# Patient Record
Sex: Female | Born: 1980
Health system: Southern US, Community
[De-identification: ages and names within clinical notes are randomized; demographics above are authoritative.]

## PROBLEM LIST (undated history)

## (undated) DIAGNOSIS — T7840XA Allergy, unspecified, initial encounter: Secondary | ICD-10-CM

## (undated) DIAGNOSIS — K219 Gastro-esophageal reflux disease without esophagitis: Secondary | ICD-10-CM

## (undated) DIAGNOSIS — F419 Anxiety disorder, unspecified: Secondary | ICD-10-CM

## (undated) DIAGNOSIS — I10 Essential (primary) hypertension: Secondary | ICD-10-CM

## (undated) HISTORY — PX: COMBINED ABDOMINOPLASTY AND LIPOSUCTION: SUR284

## (undated) HISTORY — DX: Allergy, unspecified, initial encounter: T78.40XA

## (undated) HISTORY — PX: BREAST SURGERY: SHX581

## (undated) HISTORY — DX: Essential (primary) hypertension: I10

## (undated) HISTORY — PX: REDUCTION MAMMAPLASTY: SUR839

## (undated) HISTORY — DX: Anxiety disorder, unspecified: F41.9

## (undated) HISTORY — PX: OTHER SURGICAL HISTORY: SHX169

## (undated) HISTORY — PX: COSMETIC SURGERY: SHX468

## (undated) HISTORY — DX: Gastro-esophageal reflux disease without esophagitis: K21.9

---

## 1999-09-06 ENCOUNTER — Inpatient Hospital Stay (HOSPITAL_COMMUNITY): Admission: AD | Admit: 1999-09-06 | Discharge: 1999-09-10 | Payer: Self-pay | Admitting: Obstetrics

## 1999-09-06 ENCOUNTER — Encounter (INDEPENDENT_AMBULATORY_CARE_PROVIDER_SITE_OTHER): Payer: Self-pay | Admitting: *Deleted

## 1999-09-07 ENCOUNTER — Encounter: Payer: Self-pay | Admitting: Obstetrics

## 2002-01-16 ENCOUNTER — Encounter: Payer: Self-pay | Admitting: Emergency Medicine

## 2002-01-16 ENCOUNTER — Emergency Department (HOSPITAL_COMMUNITY): Admission: EM | Admit: 2002-01-16 | Discharge: 2002-01-16 | Payer: Self-pay | Admitting: Emergency Medicine

## 2003-06-21 ENCOUNTER — Encounter: Admission: RE | Admit: 2003-06-21 | Discharge: 2003-06-21 | Payer: Self-pay | Admitting: Internal Medicine

## 2003-07-23 ENCOUNTER — Encounter: Admission: RE | Admit: 2003-07-23 | Discharge: 2003-07-23 | Payer: Self-pay | Admitting: Internal Medicine

## 2004-03-21 ENCOUNTER — Other Ambulatory Visit: Admission: RE | Admit: 2004-03-21 | Discharge: 2004-03-21 | Payer: Self-pay | Admitting: Obstetrics and Gynecology

## 2004-10-12 ENCOUNTER — Inpatient Hospital Stay (HOSPITAL_COMMUNITY): Admission: AD | Admit: 2004-10-12 | Discharge: 2004-10-14 | Payer: Self-pay | Admitting: Obstetrics and Gynecology

## 2005-01-01 ENCOUNTER — Emergency Department (HOSPITAL_COMMUNITY): Admission: EM | Admit: 2005-01-01 | Discharge: 2005-01-01 | Payer: Self-pay | Admitting: Family Medicine

## 2005-03-11 ENCOUNTER — Emergency Department (HOSPITAL_COMMUNITY): Admission: EM | Admit: 2005-03-11 | Discharge: 2005-03-11 | Payer: Self-pay | Admitting: Emergency Medicine

## 2005-05-30 ENCOUNTER — Emergency Department (HOSPITAL_COMMUNITY): Admission: EM | Admit: 2005-05-30 | Discharge: 2005-05-30 | Payer: Self-pay | Admitting: Family Medicine

## 2005-06-20 ENCOUNTER — Emergency Department (HOSPITAL_COMMUNITY): Admission: EM | Admit: 2005-06-20 | Discharge: 2005-06-20 | Payer: Self-pay | Admitting: Emergency Medicine

## 2006-09-20 ENCOUNTER — Ambulatory Visit (HOSPITAL_BASED_OUTPATIENT_CLINIC_OR_DEPARTMENT_OTHER): Admission: RE | Admit: 2006-09-20 | Discharge: 2006-09-21 | Payer: Self-pay | Admitting: Plastic Surgery

## 2006-09-20 ENCOUNTER — Encounter (INDEPENDENT_AMBULATORY_CARE_PROVIDER_SITE_OTHER): Payer: Self-pay | Admitting: Specialist

## 2006-10-26 ENCOUNTER — Emergency Department (HOSPITAL_COMMUNITY): Admission: EM | Admit: 2006-10-26 | Discharge: 2006-10-26 | Payer: Self-pay | Admitting: Family Medicine

## 2008-03-02 ENCOUNTER — Emergency Department (HOSPITAL_COMMUNITY): Admission: EM | Admit: 2008-03-02 | Discharge: 2008-03-02 | Payer: Self-pay | Admitting: Emergency Medicine

## 2008-06-28 ENCOUNTER — Emergency Department (HOSPITAL_COMMUNITY): Admission: EM | Admit: 2008-06-28 | Discharge: 2008-06-28 | Payer: Self-pay | Admitting: Emergency Medicine

## 2008-09-09 ENCOUNTER — Emergency Department (HOSPITAL_COMMUNITY): Admission: EM | Admit: 2008-09-09 | Discharge: 2008-09-09 | Payer: Self-pay | Admitting: Emergency Medicine

## 2008-12-25 ENCOUNTER — Emergency Department (HOSPITAL_COMMUNITY): Admission: EM | Admit: 2008-12-25 | Discharge: 2008-12-25 | Payer: Self-pay | Admitting: Emergency Medicine

## 2011-04-20 NOTE — H&P (Signed)
NAME:  Ashley Summers, GAL NO.:  [id number]   MEDICAL RECORD NO.:  [id number]          PATIENT TYPE:  INP   LOCATION:  9168                          FACILITY:  [facility]   PHYSICIAN:  Juluis Mire, M.D.   DATE OF BIRTH:  06-17-1981   DATE OF ADMISSION:  10/12/2004  DATE OF DISCHARGE:                                HISTORY & PHYSICAL   BRIEF HISTORY:  Patient is a 30 year old gravida 5 para 1-1-2-2 black female  who presents with spontaneous rupture of membranes.  Last menstrual period  was January 11, 2004, this gives her an estimated date of confinement  October 17, 2004 and an estimated gestational age of [redacted] weeks 4 days.  This  is consistent with initial exam and prior ultrasound done at 17 weeks.   Her prenatal course has been complicated by prior low transverse cesarean  section done with the last pregnancy for breech presentation.  Prior  pregnancy to that had a vaginal delivery.  She was desirous of a trial of  labor.  We have discussed with her the risk of uterine scar rupture of 1-2%.  This can be associated with maternal morbidity and mortality.  We also have  discussed the potential fetal morbidity and mortality.  She does wish to  proceed with trial of labor.   She also had a positive group B strep and will be treated with antibiotics  in the form of penicillin G.  Finally she was also tested positive for  Chlamydia with the pregnancy, was treated and did have a negative test of  cure.   ALLERGIES:  No known drug allergies.   MEDICATIONS INCLUDE:  Prenatal vitamins.   PRENATAL LABORATORIES:  Patient is B positive, negative antibody screen,  positive rubella, negative hepatitis B surface antigen.   PAST MEDICAL HISTORY, FAMILY HISTORY, SOCIAL HISTORY:  Please see prenatal  records.   REVIEW OF SYSTEMS:  Noncontributory.   PHYSICAL EXAMINATION:  Patient is afebrile with stable vital signs.  Lungs  are clear.  Cardiovascular system regular rhythm/rate  with a grade 2/6  systolic ejection murmur.  No clicks or gallops.  Abdominal exam with gravid  uterus consistent with dates, there is well-healed low transverse incision.  Fetal heart tones are audible.  Estimated fetal weight of approximately 6  pounds.  Pelvic exam gross rupture of membranes clear.  Pelvic exam cervix  is 2-3 cm, 50% effaced, vertex presenting, amniotic fluid was clear,  adequate pelvimetry noted.  Extremities trace edema.  Neurologic exam is  grossly within normal limits.  Deep tendon reflexes are 2+ with no clonus.   IMPRESSION:  1.  Intrauterine pregnancy at 36-plus weeks with gross rupture of membranes.  2.  Prior cesarean section desirous of trial of labor.  3.  Positive group B beta streptococcus.   PLAN:  Patient to undergo trial of labor.  Again the risk of uterine scar  rupture with associated maternal and fetal morbidity and mortality have been  explained.  This can include the need for transfusion for the mother or  possible hysterectomy.  Potential for fetal death  or damage.  Patient still  wishes to proceed with trial of labor.  Will be treated with IV penicillin G  for group B strep.  Labor will be actively managed.      JSM/MEDQ  D:  10/12/2004  T:  10/12/2004  Job:  [id number]

## 2011-04-20 NOTE — Op Note (Signed)
NAME:  Ashley Summers, Ashley Summers               ACCOUNT NO.:  [id number]   MEDICAL RECORD NO.:  [id number]          PATIENT TYPE:  AMB   LOCATION:  DSC                          FACILITY:  [facility]   PHYSICIAN:  Etter Sjogren, M.D.     DATE OF BIRTH:  December 17, 1980   DATE OF PROCEDURE:  09/20/2006  DATE OF DISCHARGE:                                 OPERATIVE REPORT   PREOPERATIVE DIAGNOSIS:  Bilateral macromastia with upper back, neck and  shoulder pain.   POSTOPERATIVE DIAGNOSIS:  Bilateral macromastia with upper back, neck and  shoulder pain.   PROCEDURE:  Bilateral breast reduction.   SURGEON:  Etter Sjogren, M.D.   ASSISTANT:  Milda Smart Cox, RNFA   ANESTHESIA:  General.   ESTIMATED BLOOD LOSS:  150 mL.   CLINICAL NOTE:  This 30 year old woman with large breasts complains of upper  back, neck, shoulder pain, and bra strap shoulder grooving.  She desires  breast reduction.  The procedure and risks and possible complications  discussed with her in detail and she understood those and wished to proceed.   PROCEDURE:  The patient marked in a full standing position in the holding  area for the breast reduction.  She was taken to the operating room and  placed supine.  After satisfactory administration of general anesthesia,  prepped with Betadine and draped with sterile drapes.  A 42 mm marker used  to mark the new nipple areolar complex and 8 cm inferior nipple pedicles  were designed.  All incisions made, inferior nipple pedicles were de-  epithelized and inferior pedicles and isolated surrounding tissue using  electrocautery building in an outward direction medially and laterally in  order to ensure broader attachment at the level of the chest wall than at  the level of the skin.  At conclusion, there was bright red bleeding around  the periphery of the nipple consistent with viability.  Resections were  performed medial, central and lateral.  A total of 780 g from the slightly  larger left side  and 752 g from slightly smaller right side.  After  irrigation, hemostasis was achieved with electrocautery.  Excellent  hemostasis having been confirmed, the closure was with 2-0 Vicryl in  interrupted inverted deep suture in the inverted T position, 3-0 Monocryl  interrupted inverted deep dermal sutures, 4-0 Monocryl running subcuticular  sutures.  Measurement was taken 5 cm up from the inframammary crease, 42 mm  marker used to mark the nipple areolar site.  Tissue resected, irrigated  thoroughly and the nipple areolar complex was found to be viable with good  color and bright red bleeding around the periphery again consistent with  viability.  Nipples were then inset using 3-0 Monocryl interrupted for deep  sutures, 4-0 Monocryl running subcuticular sutures.  Steri-Strips, dry  sterile dressing, circumferential ace wrap were applied.  She was  transferred to the recovery room stable.  Tolerated the procedure well.   DISPOSITION:  She will be observed in the ICU overnight.      Etter Sjogren, M.D.  Electronically Signed     DB/MEDQ  D:  09/20/2006  T:  09/21/2006  Job:  [id number]

## 2011-09-03 LAB — POCT RAPID STREP A: Streptococcus, Group A Screen (Direct): NEGATIVE

## 2011-09-03 LAB — POCT INFECTIOUS MONO SCREEN: Mono Screen: NEGATIVE

## 2013-04-09 ENCOUNTER — Ambulatory Visit: Payer: Self-pay | Admitting: Obstetrics

## 2014-06-24 ENCOUNTER — Encounter: Payer: Self-pay | Admitting: Obstetrics & Gynecology

## 2014-06-24 ENCOUNTER — Ambulatory Visit (INDEPENDENT_AMBULATORY_CARE_PROVIDER_SITE_OTHER): Payer: 59 | Admitting: Obstetrics & Gynecology

## 2014-06-24 VITALS — BP 131/67 | HR 59 | Temp 97.9°F | Wt 194.4 lb

## 2014-06-24 DIAGNOSIS — N72 Inflammatory disease of cervix uteri: Secondary | ICD-10-CM

## 2014-06-24 DIAGNOSIS — N926 Irregular menstruation, unspecified: Secondary | ICD-10-CM

## 2014-06-24 DIAGNOSIS — N939 Abnormal uterine and vaginal bleeding, unspecified: Secondary | ICD-10-CM

## 2014-06-24 DIAGNOSIS — Z01419 Encounter for gynecological examination (general) (routine) without abnormal findings: Secondary | ICD-10-CM

## 2014-06-24 DIAGNOSIS — Z975 Presence of (intrauterine) contraceptive device: Secondary | ICD-10-CM

## 2014-06-24 LAB — POCT URINE PREGNANCY: PREG TEST UR: NEGATIVE

## 2014-06-24 MED ORDER — ESTROGENS CONJUGATED 0.625 MG PO TABS: 0.6250 mg | ORAL_TABLET | Freq: Every day | ORAL | Status: DC

## 2014-06-24 MED ORDER — DOXYCYCLINE HYCLATE 100 MG PO TABS: 100.0000 mg | ORAL_TABLET | Freq: Two times a day (BID) | ORAL | Status: DC

## 2014-06-24 NOTE — Addendum Note (Signed)
Addended by: Ladona Ridgel on: 06/24/2014 09:59 AM   Modules accepted: Orders

## 2014-06-24 NOTE — Progress Notes (Signed)
Subjective:     Ashley Summers is a 33 y.o. female here for a routine exam.  Current complaints: abnormal bleeding.    Personal health questionnaire:  Is patient Ashley Summers, have a family history of breast and/or ovarian cancer: no Is there a family history of uterine cancer diagnosed at age < 12, gastrointestinal cancer, urinary tract cancer, family member who is a Field seismologist syndrome-associated carrier: no Is the patient overweight and hypertensive, family history of diabetes, personal history of gestational diabetes or PCOS: yes Is patient over 15, have PCOS,  family history of premature CHD under age 78, diabetes, smoke, have hypertension or peripheral artery disease:  no   Gynecologic History Patient's last menstrual period was 05/29/2014. Contraception: IUD Last Pap: 2013. Results were: normal   Obstetric History OB History  Gravida Para Term Preterm AB SAB TAB Ectopic Multiple Living  3 3 1 2      3     # Outcome Date GA Lbr Len/2nd Weight Sex Delivery Anes PTL Lv  3 TRM 10/13/04 [redacted]w[redacted]d   F VBAC None  Y  2 PRE 09/07/99 [redacted]w[redacted]d   M LTCS EPI  Y  1 PRE 03/21/97 [redacted]w[redacted]d   M SVD None  Y      History reviewed. No pertinent past medical history.  Past Surgical History  Procedure Laterality Date  . Breast surgery      Reduction     No current outpatient prescriptions on file. No Known Allergies  History  Substance Use Topics  . Smoking status: Never Smoker   . Smokeless tobacco: Never Used  . Alcohol Use: Yes    Family History  Problem Relation Age of Onset  . Hypertension Mother   . Stroke Mother       Review of Systems  Constitutional: negative for fatigue and weight loss Respiratory: negative for cough and wheezing Cardiovascular: negative for chest pain, fatigue and palpitations Gastrointestinal: negative for abdominal pain and change in bowel habits Musculoskeletal:negative for myalgias Neurological: negative for gait problems and tremors Behavioral/Psych:  negative for abusive relationship, depression Endocrine: negative for temperature intolerance   Genitourinary: positive for abnormal menstrual periods; negative for genital lesions, hot flashes, sexual problems and vaginal discharge Integument/breast: negative for breast lump, breast tenderness, nipple discharge and skin lesion(s)    Objective:       BP 131/67  Pulse 59  Temp(Src) 97.9 F (36.6 C)  Wt 88.179 kg (194 lb 6.4 oz)  LMP 05/29/2014 General:   alert  Skin:   no rash or abnormalities  Lungs:   clear to auscultation bilaterally  Heart:   regular rate and rhythm, S1, S2 normal, no murmur, click, rub or gallop  Breasts:   normal without suspicious masses, skin or nipple changes or axillary nodes  Abdomen:  normal findings: no organomegaly, soft, non-tender and no hernia  Pelvis:  External genitalia: normal general appearance Urinary system: urethral meatus normal and bladder without fullness, nontender Vaginal: normal without tenderness, induration or masses Cervix: friable; IUD strings seen Adnexa: normal bimanual exam Uterus: anteverted and non-tender, normal size  Limited pelvic USG wnl Lab Review Urine pregnancy test Labs reviewed no Radiologic studies reviewed no     Assessment:    Healthy female exam.   ?cervicitis Unscheduled bleed with mirena IUD Plan:    Education materials provided re: sterilization Doxycyline/Premarin Return prn or in 1 mth

## 2014-06-24 NOTE — Patient Instructions (Signed)
Sterilization Information, Female Female sterilization is a procedure to permanently prevent pregnancy. There are different ways to perform sterilization, but all either block or close the fallopian tubes so that your eggs cannot reach your uterus. If your egg cannot reach your uterus, sperm cannot fertilize the egg, and you cannot get pregnant.  Sterilization is performed by a surgical procedure. Sometimes these procedures are performed in a hospital while a patient is asleep. Sometimes they can be done in a clinic setting with the patient awake. The fallopian tubes can be surgically cut, tied, or sealed through a procedure called tubal ligation. The fallopian tubes can also be closed with clips or rings. Sterilization can also be done by placing a tiny coil into each fallopian tube, which causes scar tissue to grow inside the tube. The scar tissue then blocks the tubes.  Discuss sterilization with your caregiver to answer any concerns you or your partner may have. You may want to ask what type of sterilization your caregiver performs. Some caregivers may not perform all the various options. Sterilization is permanent and should only be done if you are sure you do not want children or do not want any more children. Having a sterilization reversed may not be successful.  STERILIZATION PROCEDURES  Laparoscopic sterilization. This is a surgical method performed at a time other than right after childbirth. Two incisions are made in the lower abdomen. A thin, lighted tube (laparoscope) is inserted into one of the incisions and is used to perform the procedure. The fallopian tubes are closed with a ring or a clip. An instrument that uses heat could be used to seal the tubes closed (electrocautery).   Mini-laparotomy. This is a surgical method done 1 or 2 days after giving birth. Typically, a small incision is made just below the belly button (umbilicus) and the fallopian tubes are exposed. The tubes can then be  sealed, tied, or cut.   Hysteroscopic sterilization. This is performed at a time other than right after childbirth. A tiny, spring-like coil is inserted through the cervix and uterus and placed into the fallopian tubes. The coil causes scaring and blocks the tubes. Other forms of contraception should be used for 3 months after the procedure to allow the scar tissue to form completely. Additionally, it is required hysterosalpingography be done 3 months later to ensure that the procedure was successful. Hysterosalpingography is a procedure that uses X-rays to look at your uterus and fallopian tubes after a material to make them show up better has been inserted. IS STERILIZATION SAFE? Sterilization is considered safe with very rare complications. Risks depend on the type of procedure you have. As with any surgical procedure, there are risks. Some risks of sterilization by any means include:   Bleeding.  Infection.  Reaction to anesthesia medicine.  Injury to surrounding organs. Risks specific to having hysteroscopic coils placed include:  The coils may not be placed correctly the first time.   The coils may move out of place.   The tubes may not get completely blocked after 3 months.   Injury to surrounding organs when placing the coil.  HOW EFFECTIVE IS FEMALE STERILIZATION? Sterilization is nearly 100% effective, but it can fail. Depending on the type of sterilization, the rate of failure can be as high as 3%. After hysteroscopic sterilization with placement of fallopian tube coils, you will need back-up birth control for 3 months after the procedure. Sterilization is effective for a lifetime.  BENEFITS OF STERILIZATION  It does   not affect your hormones, and therefore will not affect your menstrual periods, sexual desire, or performance.   It is effective for a lifetime.   It is safe.   You do not need to worry about getting pregnant. Keep in mind that if you had the  hysteroscopic placement procedure, you must wait 3 months after the procedure (or until your caregiver confirms) before pregnancy is not considered possible.   There are no side effects unlike other types of birth control (contraception).  DRAWBACKS OF STERILIZATION  You must be sure you do not want children or any more children. The procedure is permanent.   It does not provide protection against sexually transmitted infections (STIs).   The tubes can grow back together. If this happens, there is a risk of pregnancy. There is also an increased risk (50%) of pregnancy being an ectopic pregnancy. This is a pregnancy that happens outside of the uterus. Document Released: 05/07/2008 Document Revised: 11/24/2013 Document Reviewed: 03/06/2012 [facility] Patient Information 2015 [place], [place]. This information is not intended to replace advice given to you by your health care provider. Make sure you discuss any questions you have with your health care provider. Health Maintenance Adopting a healthy lifestyle and getting preventive care can go a long way to promote health and wellness. Talk with your health care provider about what schedule of regular examinations is right for you. This is a good chance for you to check in with your provider about disease prevention and staying healthy. In between checkups, there are plenty of things you can do on your own. Experts have done a lot of research about which lifestyle changes and preventive measures are most likely to keep you healthy. Ask your health care provider for more information. WEIGHT AND DIET  Eat a healthy diet  Be sure to include plenty of vegetables, fruits, low-fat dairy products, and lean protein.  Do not eat a lot of foods high in solid fats, added sugars, or salt.  Get regular exercise. This is one of the most important things you can do for your health.  Most adults should exercise for at least 150 minutes each week. The exercise  should increase your heart rate and make you sweat (moderate-intensity exercise).  Most adults should also do strengthening exercises at least twice a week. This is in addition to the moderate-intensity exercise.  Maintain a healthy weight  Body mass index (BMI) is a measurement that can be used to identify possible weight problems. It estimates body fat based on height and weight. Your health care provider can help determine your BMI and help you achieve or maintain a healthy weight.  For females 36 years of age and older:   A BMI below 18.5 is considered underweight.  A BMI of 18.5 to 24.9 is normal.  A BMI of 25 to 29.9 is considered overweight.  A BMI of 30 and above is considered obese.  Watch levels of cholesterol and blood lipids  You should start having your blood tested for lipids and cholesterol at 35 years of age, then have this test every 5 years.  You may need to have your cholesterol levels checked more often if:  Your lipid or cholesterol levels are high.  You are older than 35 years of age.  You are at high risk for heart disease.  CANCER SCREENING   Lung Cancer  Lung cancer screening is recommended for adults 26-2 years old who are at high risk for lung cancer because of  a history of smoking.  A yearly low-dose CT scan of the lungs is recommended for people who:  Currently smoke.  Have quit within the past 15 years.  Have at least a 30-pack-year history of smoking. A pack year is smoking an average of one pack of cigarettes a day for 1 year.  Yearly screening should continue until it has been 15 years since you quit.  Yearly screening should stop if you develop a health problem that would prevent you from having lung cancer treatment.  Breast Cancer  Practice breast self-awareness. This means understanding how your breasts normally appear and feel.  It also means doing regular breast self-exams. Let your health care provider know about any  changes, no matter how small.  If you are in your 20s or 30s, you should have a clinical breast exam (CBE) by a health care provider every 1-3 years as part of a regular health exam.  If you are 74 or older, have a CBE every year. Also consider having a breast X-ray (mammogram) every year.  If you have a family history of breast cancer, talk to your health care provider about genetic screening.  If you are at high risk for breast cancer, talk to your health care provider about having an MRI and a mammogram every year.  Breast cancer gene (BRCA) assessment is recommended for women who have family members with BRCA-related cancers. BRCA-related cancers include:  Breast.  Ovarian.  Tubal.  Peritoneal cancers.  Results of the assessment will determine the need for genetic counseling and BRCA1 and BRCA2 testing. Cervical Cancer Routine pelvic examinations to screen for cervical cancer are no longer recommended for nonpregnant women who are considered low risk for cancer of the pelvic organs (ovaries, uterus, and vagina) and who do not have symptoms. A pelvic examination may be necessary if you have symptoms including those associated with pelvic infections. Ask your health care provider if a screening pelvic exam is right for you.   The Pap test is the screening test for cervical cancer for women who are considered at risk.  If you had a hysterectomy for a problem that was not cancer or a condition that could lead to cancer, then you no longer need Pap tests.  If you are older than 65 years, and you have had normal Pap tests for the past 10 years, you no longer need to have Pap tests.  If you have had past treatment for cervical cancer or a condition that could lead to cancer, you need Pap tests and screening for cancer for at least 20 years after your treatment.  If you no longer get a Pap test, assess your risk factors if they change (such as having a new sexual partner). This can affect  whether you should start being screened again.  Some women have medical problems that increase their chance of getting cervical cancer. If this is the case for you, your health care provider may recommend more frequent screening and Pap tests.  The human papillomavirus (HPV) test is another test that may be used for cervical cancer screening. The HPV test looks for the virus that can cause cell changes in the cervix. The cells collected during the Pap test can be tested for HPV.  The HPV test can be used to screen women 35 years of age and older. Getting tested for HPV can extend the interval between normal Pap tests from three to five years.  An HPV test also should be used  to screen women of any age who have unclear Pap test results.  After 35 years of age, women should have HPV testing as often as Pap tests.  Colorectal Cancer  This type of cancer can be detected and often prevented.  Routine colorectal cancer screening usually begins at 35 years of age and continues through 35 years of age.  Your health care provider may recommend screening at an earlier age if you have risk factors for colon cancer.  Your health care provider may also recommend using home test kits to check for hidden blood in the stool.  A small camera at the end of a tube can be used to examine your colon directly (sigmoidoscopy or colonoscopy). This is done to check for the earliest forms of colorectal cancer.  Routine screening usually begins at age 69.  Direct examination of the colon should be repeated every 5-10 years through 35 years of age. However, you may need to be screened more often if early forms of precancerous polyps or small growths are found. Skin Cancer  Check your skin from head to toe regularly.  Tell your health care provider about any new moles or changes in moles, especially if there is a change in a mole's shape or color.  Also tell your health care provider if you have a mole that is  larger than the size of a pencil eraser.  Always use sunscreen. Apply sunscreen liberally and repeatedly throughout the day.  Protect yourself by wearing long sleeves, pants, a wide-brimmed hat, and sunglasses whenever you are outside. HEART DISEASE, DIABETES, AND HIGH BLOOD PRESSURE   Have your blood pressure checked at least every 1-2 years. High blood pressure causes heart disease and increases the risk of stroke.  If you are between 31 years and 47 years old, ask your health care provider if you should take aspirin to prevent strokes.  Have regular diabetes screenings. This involves taking a blood sample to check your fasting blood sugar level.  If you are at a normal weight and have a low risk for diabetes, have this test once every three years after 35 years of age.  If you are overweight and have a high risk for diabetes, consider being tested at a younger age or more often. PREVENTING INFECTION  Hepatitis B  If you have a higher risk for hepatitis B, you should be screened for this virus. You are considered at high risk for hepatitis B if:  You were born in a country where hepatitis B is common. Ask your health care provider which countries are considered high risk.  Your parents were born in a high-risk country, and you have not been immunized against hepatitis B (hepatitis B vaccine).  You have HIV or AIDS.  You use needles to inject street drugs.  You live with someone who has hepatitis B.  You have had sex with someone who has hepatitis B.  You get hemodialysis treatment.  You take certain medicines for conditions, including cancer, organ transplantation, and autoimmune conditions. Hepatitis C  Blood testing is recommended for:  Everyone born from [birth year] through 1967.  Anyone with known risk factors for hepatitis C. Sexually transmitted infections (STIs)  You should be screened for sexually transmitted infections (STIs) including gonorrhea and chlamydia  if:  You are sexually active and are younger than 35 years of age.  You are older than 35 years of age and your health care provider tells you that you are at risk for this type  of infection.  Your sexual activity has changed since you were last screened and you are at an increased risk for chlamydia or gonorrhea. Ask your health care provider if you are at risk.  If you do not have HIV, but are at risk, it may be recommended that you take a prescription medicine daily to prevent HIV infection. This is called pre-exposure prophylaxis (PrEP). You are considered at risk if:  You are sexually active and do not regularly use condoms or know the HIV status of your partner(s).  You take drugs by injection.  You are sexually active with a partner who has HIV. Talk with your health care provider about whether you are at high risk of being infected with HIV. If you choose to begin PrEP, you should first be tested for HIV. You should then be tested every 3 months for as long as you are taking PrEP.  PREGNANCY   If you are premenopausal and you may become pregnant, ask your health care provider about preconception counseling.  If you may become pregnant, take 400 to 800 micrograms (mcg) of folic acid every day.  If you want to prevent pregnancy, talk to your health care provider about birth control (contraception). OSTEOPOROSIS AND MENOPAUSE   Osteoporosis is a disease in which the bones lose minerals and strength with aging. This can result in serious bone fractures. Your risk for osteoporosis can be identified using a bone density scan.  If you are 16 years of age or older, or if you are at risk for osteoporosis and fractures, ask your health care provider if you should be screened.  Ask your health care provider whether you should take a calcium or vitamin D supplement to lower your risk for osteoporosis.  Menopause may have certain physical symptoms and risks.  Hormone replacement therapy  may reduce some of these symptoms and risks. Talk to your health care provider about whether hormone replacement therapy is right for you.  HOME CARE INSTRUCTIONS   Schedule regular health, dental, and eye exams.  Stay current with your immunizations.   Do not use any tobacco products including cigarettes, chewing tobacco, or electronic cigarettes.  If you are pregnant, do not drink alcohol.  If you are breastfeeding, limit how much and how often you drink alcohol.  Limit alcohol intake to no more than 1 drink per day for nonpregnant women. One drink equals 12 ounces of beer, 5 ounces of wine, or 1 ounces of hard liquor.  Do not use street drugs.  Do not share needles.  Ask your health care provider for help if you need support or information about quitting drugs.  Tell your health care provider if you often feel depressed.  Tell your health care provider if you have ever been abused or do not feel safe at home. Document Released: 06/04/2011 Document Revised: 04/05/2014 Document Reviewed: 10/21/2013 [facility] Patient Information 2015 [place], [place]. This information is not intended to replace advice given to you by your health care provider. Make sure you discuss any questions you have with your health care provider.

## 2014-06-24 NOTE — Addendum Note (Signed)
Addended by: Ladona Ridgel on: 06/24/2014 01:03 PM   Modules accepted: Orders

## 2014-06-25 ENCOUNTER — Telehealth: Payer: Self-pay | Admitting: *Deleted

## 2014-06-25 ENCOUNTER — Other Ambulatory Visit: Payer: Self-pay | Admitting: *Deleted

## 2014-06-25 DIAGNOSIS — N939 Abnormal uterine and vaginal bleeding, unspecified: Secondary | ICD-10-CM

## 2014-06-25 LAB — HIV ANTIBODY (ROUTINE TESTING W REFLEX): HIV 1&2 Ab, 4th Generation: NONREACTIVE

## 2014-06-25 LAB — RPR

## 2014-06-25 MED ORDER — ESTROGENS CONJUGATED 0.625 MG PO TABS: 0.6250 mg | ORAL_TABLET | Freq: Every day | ORAL | Status: DC

## 2014-06-25 NOTE — Progress Notes (Signed)
Prescription for Premarin resent to pharmacy due to error in entry.

## 2014-06-25 NOTE — Telephone Encounter (Signed)
Pt placed call to office to have Rx transferred to [place]. Return call to pt to verify Rx to be sent.  Pt states that she had already called pharmacy and had them transferred.

## 2014-06-28 LAB — PAP IG, CT-NG NAA, HPV HIGH-RISK
Chlamydia Probe Amp: NEGATIVE
GC Probe Amp: NEGATIVE
HPV DNA High Risk: NOT DETECTED

## 2014-07-09 ENCOUNTER — Ambulatory Visit (INDEPENDENT_AMBULATORY_CARE_PROVIDER_SITE_OTHER): Payer: 59 | Admitting: Family Medicine

## 2014-07-09 ENCOUNTER — Encounter: Payer: Self-pay | Admitting: Family Medicine

## 2014-07-09 VITALS — BP 108/80 | HR 62 | Temp 98.6°F | Ht 64.0 in | Wt 191.0 lb

## 2014-07-09 DIAGNOSIS — Z7189 Other specified counseling: Secondary | ICD-10-CM

## 2014-07-09 DIAGNOSIS — Z7689 Persons encountering health services in other specified circumstances: Secondary | ICD-10-CM

## 2014-07-09 DIAGNOSIS — H8113 Benign paroxysmal vertigo, bilateral: Secondary | ICD-10-CM

## 2014-07-09 DIAGNOSIS — J302 Other seasonal allergic rhinitis: Secondary | ICD-10-CM

## 2014-07-09 DIAGNOSIS — J3089 Other allergic rhinitis: Secondary | ICD-10-CM

## 2014-07-09 DIAGNOSIS — H8110 Benign paroxysmal vertigo, unspecified ear: Secondary | ICD-10-CM

## 2014-07-09 DIAGNOSIS — E669 Obesity, unspecified: Secondary | ICD-10-CM

## 2014-07-09 DIAGNOSIS — G5600 Carpal tunnel syndrome, unspecified upper limb: Secondary | ICD-10-CM

## 2014-07-09 DIAGNOSIS — G5603 Carpal tunnel syndrome, bilateral upper limbs: Secondary | ICD-10-CM

## 2014-07-09 NOTE — Progress Notes (Addendum)
No chief complaint on file.   HPI:  Ashley Summers is here to establish care. Has not seen primary care doctor in about 1 year. Last PCP and physical: gets gyn physical with gyn  Has the following chronic problems and concerns today:  There are no active problems to display for this patient.  Vertigo: -started about 2-3 months, occurred a few times  -symptoms: brief vertigo that lasted a few seconds to minutes -denies: HA, weakness, numbness, hearing loss, vision loss, vomiting, presyncope, syncope, CP, SOB -has not occurred in over 1 month  Allergic Rhinitis: -on zyrtec in the past  Obesity  CTS: -dx with NCS, surgery advised -wears wrist brace   ROS negative for unless reported above: fevers, unintentional weight loss, hearing or vision loss, chest pain, palpitations, struggling to breath, hemoptysis, melena, hematochezia, hematuria, falls, loc, si, thoughts of self harm  Past Medical History  Diagnosis Date  . Allergy     Family History  Problem Relation Age of Onset  . Hypertension Mother   . Stroke Mother     History   Social History  . Marital Status: Single    Spouse Name: N/A    Number of Children: N/A  . Years of Education: N/A   Social History Main Topics  . Smoking status: Never Smoker   . Smokeless tobacco: Never Used  . Alcohol Use: Yes  . Drug Use: No  . Sexual Activity: Yes    Birth Control/ Protection: Condom, IUD   Other Topics Concern  . None   Social History Narrative   Work or School: Chartered certified accountant, [employer]      Home Situation: lives with 3 kids (17,15, 9)      Spiritual Beliefs: none      Lifestyle: diet is so so; has been trying to exercising             No current outpatient prescriptions on file.  EXAM:  Filed Vitals:   07/09/14 1437  BP: 108/80  Pulse: 62  Temp: 98.6 F (37 C)    Body mass index is 32.77 kg/(m^2).  GENERAL: vitals reviewed and listed above, alert, oriented, appears well hydrated and  in no acute distress  HEENT: atraumatic, conjunttiva clear, no obvious abnormalities on inspection of external nose and ears  NECK: no obvious masses on inspection  LUNGS: clear to auscultation bilaterally, no wheezes, rales or rhonchi, good air movement  CV: HRRR, no peripheral edema  MS: moves all extremities without noticeable abnormality  PSYCH: pleasant and cooperative, no obvious depression or anxiety  ASSESSMENT AND PLAN:  Discussed the following assessment and plan:  Obesity, unspecified  Benign paroxysmal positional vertigo, bilateral  Other seasonal allergic rhinitis  Bilateral carpal tunnel syndrome  Encounter to establish care  -We reviewed the PMH, PSH, FH, SH, Meds and Allergies. -We provided refills for any medications we will prescribe as needed. -We addressed current concerns per orders and patient instructions. -We have asked for records for pertinent exams, studies, vaccines and notes from previous providers. -We have advised patient to follow up per instructions below. -vertigo resolved, follow up if recurs   -Patient advised to return or notify a doctor immediately if symptoms worsen or persist or new concerns arise.  Patient Instructions  Treatment Options for Obesity:  A comprehensive exercise and diet program are advised and are the initial and longterm/lifelong treatment for obesity.  [place]  This is a long and initially often difficult process and it  will often take years to get to reach optimal health  As you get healthier you will build extra blood volume and lean tissue that is heavier then fatty tissue and you will hit plateaus in your weight - thus I ADVISE YOU DO NOT FOLLOW YOUR WEIGHT ON A SCALE   The safest and healthiest way to improve you physical and mental health is to: - eat a healthy diet consisting of lots of vegetables, fruits, beans, nuts, seeds, healthy meats such as white chicken and fish and whole grains.   - avoid fried foods, fast food, processed foods, sodas, red meet and other fattening foods.  - get a least 150-300 minutes of aerobic exercise per week.  -reduce stress - counseling, meditation, relaxation to balance other aspects of your life  Medication for Obesity: IF BMI >30 and you have not achieved weight loss through diet and exercise alone, we suggest pharmacologic therapy can be added to diet and exercise.  First Choice:  1)Orlistat (Xenical): -likely the safest in terms of heart and vascular side effects and helps cholesterol - usually recommended for 2 - 4 years -common and or serious side effects include but are not limited to: anaphylaxis, liver toxicity, gas, oily stools, loose stools, fatty stools -150 mg up to 3 times daily only during meals containing fat, diet should be <30% fat -not recommended in: pregnancy, malabsorption, some kidney stones, anorexia or bulemia history  2)Lorcaserin (Belviq) -no long term safety data -to be discontinued if do not achieve 5% body weight reduction in 12 weeks -common and or serious side effects include but are not limited to: heart disease, anemia, abuse and dependency, depression, suicide, nausea and vomiting, headaches, low blood sugar, dizziness, anemia, fatigue, diarrhea, urinary tract infections, back pain, dry mouth, pain, cognitive impairment 10mg  twice daily -not recommended if pregnant, kidney disease, heart disease, diabetes, depression -I do not advise long term and advise weaning of if not effective and after 1-2 years  3)combination phentermine-topiramate (Qsymia)- for men or post menopausal women or monthly pregnancy test: -can cause fetal anomalies -to be discontinued GRADUALLY if do not achieve 5% body weight reduction in 12 weeks -adverse: -not recommended if: heart disease, breastfeeding, pregnant, hyperthyroid, glaucoma, drug abuse history, renal or liver problems, alcohol use, depression -common and or serious side  effects include but are not limited to: metabolic acidosis, kidney stones, bone issues, electrolyte issues, heart attack, suicide, psychosis, dependency and abuse, severe reaction, seizure is stop suddenly, dry mouth, numbness, headache, blurred vision, diarrhea, fatigue, depression, anxiety, pain, poor focus, acid reflux, dry eyes, eye pain, heart arhythmia -I do not advise long term and advise weaning of if not effective and in 1-2 years  If diabetes:  1) Metformin, orlistat or  2)Liraglutide (Victoza) -can cause t cell tumors, anaphylaxis, kidney toxicity, pancreatitis, nausea, vomiting, diarrhea, headache -injected:0.6mg  daily for 1 week, then 1.2mg  daily  Surgery is also an option and [place] has many useful resources if you are considering this.  Please check out this website if you wish:  FactoringRate.ca  Exercises for carpal tunnel      Devontre Siedschlag R.

## 2014-07-09 NOTE — Progress Notes (Signed)
Pre visit review using our clinic review tool, if applicable. No additional management support is needed unless otherwise documented below in the visit note. 

## 2014-07-09 NOTE — Patient Instructions (Addendum)
Treatment Options for Obesity:  A comprehensive exercise and diet program are advised and are the initial and longterm/lifelong treatment for obesity.  [place]  This is a long and initially often difficult process and it will often take years to get to reach optimal health  As you get healthier you will build extra blood volume and lean tissue that is heavier then fatty tissue and you will hit plateaus in your weight - thus I ADVISE YOU DO NOT FOLLOW YOUR WEIGHT ON A SCALE   The safest and healthiest way to improve you physical and mental health is to: - eat a healthy diet consisting of lots of vegetables, fruits, beans, nuts, seeds, healthy meats such as white chicken and fish and whole grains.  - avoid fried foods, fast food, processed foods, sodas, red meet and other fattening foods.  - get a least 150-300 minutes of aerobic exercise per week.  -reduce stress - counseling, meditation, relaxation to balance other aspects of your life  Medication for Obesity: IF BMI >30 and you have not achieved weight loss through diet and exercise alone, we suggest pharmacologic therapy can be added to diet and exercise.  First Choice:  1)Orlistat (Xenical): -likely the safest in terms of heart and vascular side effects and helps cholesterol - usually recommended for 2 - 4 years -common and or serious side effects include but are not limited to: anaphylaxis, liver toxicity, gas, oily stools, loose stools, fatty stools -150 mg up to 3 times daily only during meals containing fat, diet should be <30% fat -not recommended in: pregnancy, malabsorption, some kidney stones, anorexia or bulemia history  2)Lorcaserin (Belviq) -no long term safety data -to be discontinued if do not achieve 5% body weight reduction in 12 weeks -common and or serious side effects include but are not limited to: heart disease, anemia, abuse and dependency, depression, suicide, nausea and vomiting, headaches, low  blood sugar, dizziness, anemia, fatigue, diarrhea, urinary tract infections, back pain, dry mouth, pain, cognitive impairment 10mg  twice daily -not recommended if pregnant, kidney disease, heart disease, diabetes, depression -I do not advise long term and advise weaning of if not effective and after 1-2 years  3)combination phentermine-topiramate (Qsymia)- for men or post menopausal women or monthly pregnancy test: -can cause fetal anomalies -to be discontinued GRADUALLY if do not achieve 5% body weight reduction in 12 weeks -adverse: -not recommended if: heart disease, breastfeeding, pregnant, hyperthyroid, glaucoma, drug abuse history, renal or liver problems, alcohol use, depression -common and or serious side effects include but are not limited to: metabolic acidosis, kidney stones, bone issues, electrolyte issues, heart attack, suicide, psychosis, dependency and abuse, severe reaction, seizure is stop suddenly, dry mouth, numbness, headache, blurred vision, diarrhea, fatigue, depression, anxiety, pain, poor focus, acid reflux, dry eyes, eye pain, heart arhythmia -I do not advise long term and advise weaning of if not effective and in 1-2 years  If diabetes:  1) Metformin, orlistat or  2)Liraglutide (Victoza) -can cause t cell tumors, anaphylaxis, kidney toxicity, pancreatitis, nausea, vomiting, diarrhea, headache -injected:0.6mg  daily for 1 week, then 1.2mg  daily  Surgery is also an option and [place] has many useful resources if you are considering this.  Please check out this website if you wish:  FactoringRate.ca  Exercises for carpal tunnel

## 2014-07-28 ENCOUNTER — Ambulatory Visit: Payer: 59 | Admitting: Obstetrics & Gynecology

## 2014-10-04 ENCOUNTER — Encounter: Payer: Self-pay | Admitting: Family Medicine

## 2014-10-11 ENCOUNTER — Ambulatory Visit (INDEPENDENT_AMBULATORY_CARE_PROVIDER_SITE_OTHER): Payer: 59 | Admitting: Family Medicine

## 2014-10-11 ENCOUNTER — Encounter: Payer: Self-pay | Admitting: Family Medicine

## 2014-10-11 VITALS — BP 118/82 | HR 72 | Temp 98.1°F | Ht 63.75 in | Wt 193.8 lb

## 2014-10-11 DIAGNOSIS — Z0000 Encounter for general adult medical examination without abnormal findings: Secondary | ICD-10-CM

## 2014-10-11 DIAGNOSIS — Z23 Encounter for immunization: Secondary | ICD-10-CM

## 2014-10-11 LAB — HEMOGLOBIN A1C: Hgb A1c MFr Bld: 5.4 % (ref 4.6–6.5)

## 2014-10-11 LAB — LIPID PANEL
CHOLESTEROL: 159 mg/dL (ref 0–200)
HDL: 49.6 mg/dL (ref >39.00)
LDL Cholesterol: 99 mg/dL (ref 0–99)
NONHDL: 109.4
TRIGLYCERIDES: 54 mg/dL (ref 0.0–149.0)
Total CHOL/HDL Ratio: 3
VLDL: 10.8 mg/dL (ref 0.0–40.0)

## 2014-10-11 NOTE — Addendum Note (Signed)
Addended by: Agnes Lawrence on: 10/11/2014 09:17 AM   Modules accepted: Orders

## 2014-10-11 NOTE — Progress Notes (Signed)
HPI:  Here for CPE:  -Concerns and/or follow up today: none  -Diet: diet is "not good" - doesn't eat regular meals, binges when she does eat  -Exercise: regular exercise - going 3 days per week  -Taking folic acid, vitamin D or calcium: no  -Diabetes and Dyslipidemia Screening: doing today  -Hx of HTN: no  -Vaccines: wants Tdap today  -pap history: had pap and breast exam with her gynecologist - Dr. Delsa Sale  -FDLMP: Oct 20th  -sexual activity: yes, female partner, no new partners  -wants STI testing: no, just done  -FH breast, colon or ovarian ca: see FH Last mammogram: n/a Last colon cancer screening: n/a  Breast Ca Risk Assessment: -SolutionApps.it  Genetic Counseling Screen: -no personal history or first degree relative with breast or ovarian ca  -Alcohol, Tobacco, drug use: see social history  Review of Systems - no fevers, unintentional weight loss, vision loss, hearing loss, chest pain, sob, hemoptysis, melena, hematochezia, hematuria, genital discharge, changing or concerning skin lesions, bleeding, bruising, loc, thoughts of self harm or SI  Past Medical History  Diagnosis Date  . Allergy     Past Surgical History  Procedure Laterality Date  . Hammertoe correction    . Breast surgery      Reduction     Family History  Problem Relation Age of Onset  . Hypertension Mother   . Stroke Mother     History   Social History  . Marital Status: Single    Spouse Name: N/A    Number of Children: N/A  . Years of Education: N/A   Social History Main Topics  . Smoking status: Never Smoker   . Smokeless tobacco: Never Used  . Alcohol Use: Yes  . Drug Use: No  . Sexual Activity: Yes    Birth Control/ Protection: Condom, IUD   Other Topics Concern  . None   Social History Narrative   Work or School: Chartered certified accountant, [employer], [employer]      Home Situation: lives with 3 kids (17,15, 9)      Spiritual Beliefs: none      Lifestyle: diet is so so; has been trying to exercising             Current outpatient prescriptions: levonorgestrel (MIRENA) 20 MCG/24HR IUD, 1 each by Intrauterine route once., Disp: , Rfl:   EXAM:  Filed Vitals:   10/11/14 0832  BP: 118/82  Pulse: 72  Temp: 98.1 F (36.7 C)    GENERAL: vitals reviewed and listed below, alert, oriented, appears well hydrated and in no acute distress  HEENT: head atraumatic, PERRLA, normal appearance of eyes, ears, nose and mouth. moist mucus membranes.  NECK: supple, no masses or lymphadenopathy  LUNGS: clear to auscultation bilaterally, no rales, rhonchi or wheeze  CV: HRRR, no peripheral edema or cyanosis, normal pedal pulses  BREAST: done with gyn - declined  ABDOMEN: bowel sounds normal, soft, non tender to palpation, no masses, no rebound or guarding  GU: done with - gyn  RECTAL: refused  SKIN: no rash or abnormal lesions  MS: normal gait, moves all extremities normally  NEURO: CN II-XII grossly intact, normal muscle strength and sensation to light touch on extremities  PSYCH: normal affect, pleasant and cooperative  ASSESSMENT AND PLAN:  Discussed the following assessment and plan:  Visit for preventive health examination - Plan: Lipid Panel, Hemoglobin A1c  -Discussed and advised all [place] preventive services health task force level A and B recommendations for age,  sex and risks.  -Advised at least 150 minutes of exercise per week and a healthy diet low in saturated fats and sweets and consisting of fresh fruits and vegetables, lean meats such as fish and white chicken and whole grains.  -FASTING labs, studies and vaccines per orders this encounter  Orders Placed This Encounter  Procedures  . Lipid Panel  . Hemoglobin A1c    Patient advised to return to clinic immediately if symptoms worsen or persist or new concerns.  Patient Instructions  BEFORE YOU LEAVE: -Tdap -labs  We recommend the following healthy  lifestyle measures: - eat a healthy diet consisting of lots of vegetables, fruits, beans, nuts, seeds, healthy meats such as white chicken and fish and whole grains.  - avoid fried foods, fast food, processed foods, sodas, red meet and other fattening foods.  - get a least 150 minutes of aerobic exercise per week.      No Follow-up on file.  Colin Benton R.

## 2014-10-11 NOTE — Progress Notes (Signed)
Pre visit review using our clinic review tool, if applicable. No additional management support is needed unless otherwise documented below in the visit note. 

## 2014-10-11 NOTE — Patient Instructions (Signed)
BEFORE YOU LEAVE: -Tdap -labs  We recommend the following healthy lifestyle measures: - eat a healthy diet consisting of lots of vegetables, fruits, beans, nuts, seeds, healthy meats such as white chicken and fish and whole grains.  - avoid fried foods, fast food, processed foods, sodas, red meet and other fattening foods.  - get a least 150 minutes of aerobic exercise per week.   

## 2014-11-29 ENCOUNTER — Encounter: Payer: Self-pay | Admitting: *Deleted

## 2014-11-30 ENCOUNTER — Encounter: Payer: Self-pay | Admitting: Obstetrics & Gynecology

## 2015-08-26 ENCOUNTER — Ambulatory Visit: Payer: 59 | Admitting: Certified Nurse Midwife

## 2015-09-07 ENCOUNTER — Ambulatory Visit: Payer: 59 | Admitting: Certified Nurse Midwife

## 2015-09-20 ENCOUNTER — Ambulatory Visit (INDEPENDENT_AMBULATORY_CARE_PROVIDER_SITE_OTHER): Payer: 59 | Admitting: Certified Nurse Midwife

## 2015-09-20 ENCOUNTER — Telehealth: Payer: Self-pay | Admitting: *Deleted

## 2015-09-20 ENCOUNTER — Encounter: Payer: Self-pay | Admitting: Certified Nurse Midwife

## 2015-09-20 VITALS — BP 139/93 | HR 68 | Temp 98.6°F | Ht 64.0 in | Wt 200.0 lb

## 2015-09-20 DIAGNOSIS — Z01419 Encounter for gynecological examination (general) (routine) without abnormal findings: Secondary | ICD-10-CM

## 2015-09-20 DIAGNOSIS — E669 Obesity, unspecified: Secondary | ICD-10-CM

## 2015-09-20 DIAGNOSIS — Z113 Encounter for screening for infections with a predominantly sexual mode of transmission: Secondary | ICD-10-CM

## 2015-09-20 LAB — CBC WITH DIFFERENTIAL/PLATELET
Basophils Absolute: 0 10*3/uL (ref 0.0–0.1)
Basophils Relative: 0 % (ref 0–1)
Eosinophils Absolute: 0.2 10*3/uL (ref 0.0–0.7)
Eosinophils Relative: 5 % (ref 0–5)
HEMATOCRIT: 40.9 % (ref 36.0–46.0)
HEMOGLOBIN: 13.5 g/dL (ref 12.0–15.0)
LYMPHS PCT: 46 % (ref 12–46)
Lymphs Abs: 2.2 10*3/uL (ref 0.7–4.0)
MCH: 30.6 pg (ref 26.0–34.0)
MCHC: 33 g/dL (ref 30.0–36.0)
MCV: 92.7 fL (ref 78.0–100.0)
MONO ABS: 0.4 10*3/uL (ref 0.1–1.0)
MONOS PCT: 8 % (ref 3–12)
MPV: 12 fL (ref 8.6–12.4)
NEUTROS ABS: 2 10*3/uL (ref 1.7–7.7)
NEUTROS PCT: 41 % — AB (ref 43–77)
Platelets: 184 10*3/uL (ref 150–400)
RBC: 4.41 MIL/uL (ref 3.87–5.11)
RDW: 13.2 % (ref 11.5–15.5)
WBC: 4.8 10*3/uL (ref 4.0–10.5)

## 2015-09-20 LAB — COMPREHENSIVE METABOLIC PANEL
ALBUMIN: 4.1 g/dL (ref 3.6–5.1)
ALK PHOS: 58 U/L (ref 33–115)
ALT: 20 U/L (ref 6–29)
AST: 16 U/L (ref 10–30)
BILIRUBIN TOTAL: 0.5 mg/dL (ref 0.2–1.2)
BUN: 18 mg/dL (ref 7–25)
CALCIUM: 9 mg/dL (ref 8.6–10.2)
CO2: 25 mmol/L (ref 20–31)
CREATININE: 0.9 mg/dL (ref 0.50–1.10)
Chloride: 101 mmol/L (ref 98–110)
Glucose, Bld: 99 mg/dL (ref 65–99)
Potassium: 4 mmol/L (ref 3.5–5.3)
SODIUM: 138 mmol/L (ref 135–146)
TOTAL PROTEIN: 7 g/dL (ref 6.1–8.1)

## 2015-09-20 LAB — HEMOGLOBIN A1C
Hgb A1c MFr Bld: 5.6 % (ref <5.7)
Mean Plasma Glucose: 114 mg/dL (ref <117)

## 2015-09-20 LAB — CHOLESTEROL, TOTAL: CHOLESTEROL: 176 mg/dL (ref 125–200)

## 2015-09-20 LAB — TSH: TSH: 1.754 u[IU]/mL (ref 0.350–4.500)

## 2015-09-20 LAB — TRIGLYCERIDES: TRIGLYCERIDES: 208 mg/dL — AB (ref <150)

## 2015-09-20 LAB — HDL CHOLESTEROL: HDL: 53 mg/dL (ref >=46)

## 2015-09-20 MED ORDER — NALTREXONE-BUPROPION HCL ER 8-90 MG PO TB12: ORAL_TABLET | ORAL | Status: DC

## 2015-09-20 NOTE — Progress Notes (Signed)
Patient ID: Ashley Summers, female   DOB: 06/13/1981, 34 y.o.   MRN: [id number]    Subjective:      Ashley Summers is a 34 y.o. female here for a routine exam.  Current complaints: obesity, occasionally sexually active.   Is happy with her Mirena IUD.  Does not desire to be pregnant again.     Personal health questionnaire:  Is patient [name], have a family history of breast and/or ovarian cancer: no Is there a family history of uterine cancer diagnosed at age < 41, gastrointestinal cancer, urinary tract cancer, family member who is a Field seismologist syndrome-associated carrier: no Is the patient overweight and hypertensive, family history of diabetes, personal history of gestational diabetes, preeclampsia or PCOS: yes Is patient over 8, have PCOS,  family history of premature CHD under age 26, diabetes, smoke, have hypertension or peripheral artery disease:  Yes, mother CVA before 69 yrs of age, HTN; MGM && maternal aunt DM At any time, has a partner hit, kicked or otherwise hurt or frightened you?: no Over the past 2 weeks, have you felt down, depressed or hopeless?: no Over the past 2 weeks, have you felt little interest or pleasure in doing things?:no   Gynecologic History No LMP recorded. Contraception: IUD Last Pap: 06/2014. Results were: normal Last mammogram: N/A.   Obstetric History OB History  Gravida Para Term Preterm AB SAB TAB Ectopic Multiple Living  3 3 1 2      3     # Outcome Date GA Lbr Len/2nd Weight Sex Delivery Anes PTL Lv  3 Term 10/13/04 [redacted]w[redacted]d   F VBAC None  Y  2 Preterm 09/07/99 [redacted]w[redacted]d   Berenice Bouton EPI  Y  1 Preterm 03/21/97 [redacted]w[redacted]d   M Vag-Spont None  Y      Past Medical History  Diagnosis Date  . Allergy     Past Surgical History  Procedure Laterality Date  . Hammertoe correction    . Breast surgery      Reduction      Current outpatient prescriptions:  .  levonorgestrel (MIRENA) 20 MCG/24HR IUD, 1 each by Intrauterine route once., Disp: , Rfl:   .  Naltrexone-Bupropion HCl ER 8-90 MG TB12, Take 1 tablet in the AM for 7 days, week 2 take 2 tablets 2X/day, week 3 take 2 tabs in the am & 1 tab in the pm, after that 2 tabs 2/X day., Disp: 84 tablet, Rfl: 6 Allergies  Allergen Reactions  . Penicillins Rash    Social History  Substance Use Topics  . Smoking status: Never Smoker   . Smokeless tobacco: Never Used  . Alcohol Use: 0.0 oz/week    0 Standard drinks or equivalent per week     Comment: occasional    Family History  Problem Relation Age of Onset  . Hypertension Mother   . Stroke Mother       Review of Systems  Constitutional: negative for fatigue and weight loss Respiratory: negative for cough and wheezing Cardiovascular: negative for chest pain, fatigue and palpitations Gastrointestinal: negative for abdominal pain and change in bowel habits Musculoskeletal:negative for myalgias Neurological: negative for gait problems and tremors Behavioral/Psych: negative for abusive relationship, depression Endocrine: negative for temperature intolerance   Genitourinary:negative for abnormal menstrual periods, genital lesions, hot flashes, sexual problems and vaginal discharge Integument/breast: negative for breast lump, breast tenderness, nipple discharge and skin lesion(s)    Objective:       BP 139/93 mmHg  Pulse  68  Temp(Src) 98.6 F (37 C)  Ht 5\' 4"  (1.626 m)  Wt 200 lb (90.719 kg)  BMI 34.31 kg/m2  LMP  General:   alert  Skin:   no rash or abnormalities  Lungs:   clear to auscultation bilaterally  Heart:   regular rate and rhythm, S1, S2 normal, no murmur, click, rub or gallop  Breasts:   normal without suspicious masses, skin or nipple changes or axillary nodes  Abdomen:  normal findings: no organomegaly, soft, non-tender and no hernia  Pelvis:  External genitalia: normal general appearance Urinary system: urethral meatus normal and bladder without fullness, nontender Vaginal: normal without tenderness,  induration or masses Cervix: normal appearance Adnexa: normal bimanual exam Uterus: anteverted and non-tender, normal size   Lab Review Urine pregnancy test Labs reviewed yes Radiologic studies reviewed no  50% of 30 min visit spent on counseling and coordination of care.   Assessment:    Healthy female exam.   Strings present on exam  obesity  STD screening exam  Plan:    Education reviewed: calcium supplements, depression evaluation, low fat, low cholesterol diet, safe sex/STD prevention, self breast exams, skin cancer screening and weight bearing exercise. Contraception: IUD. Follow up in: 1 year.   Meds ordered this encounter  Medications  . Naltrexone-Bupropion HCl ER 8-90 MG TB12    Sig: Take 1 tablet in the AM for 7 days, week 2 take 2 tablets 2X/day, week 3 take 2 tabs in the am & 1 tab in the pm, after that 2 tabs 2/X day.    Dispense:  84 tablet    Refill:  6   Orders Placed This Encounter  Procedures  . SureSwab, Vaginosis/Vaginitis Plus  . HIV antibody (with reflex)  . Hepatitis B surface antigen  . RPR  . Hepatitis C antibody  . CBC with Differential/Platelet  . Comprehensive metabolic panel  . TSH  . Cholesterol, total  . Triglycerides  . HDL cholesterol  . Hemoglobin A1c  . Referral to Nutrition and Diabetes Services    Referral Priority:  Routine    Referral Type:  Consultation    Referral Reason:  Specialty Services Required    Number of Visits Requested:  1    Possible management options include: Phentermine if her blood pressure is normatensive Follow up as needed.

## 2015-09-20 NOTE — Telephone Encounter (Signed)
Call placed to pt making her aware that her Rx for Contrave is not covered by insurance.  Pt advised to contact pharmacy in order to get cash price for Rx.  Pt was also made aware that she may sign up on website in order to receive discount for medication. Pt advised to call office if price for medication is to expensive. Pt states understanding.

## 2015-09-21 LAB — HIV ANTIBODY (ROUTINE TESTING W REFLEX): HIV: NONREACTIVE

## 2015-09-21 LAB — RPR

## 2015-09-21 LAB — HEPATITIS C ANTIBODY: HCV Ab: NEGATIVE

## 2015-09-21 LAB — HEPATITIS B SURFACE ANTIGEN: Hepatitis B Surface Ag: NEGATIVE

## 2015-09-22 LAB — PAP, TP IMAGING W/ HPV RNA, RFLX HPV TYPE 16,18/45: HPV mRNA, High Risk: NOT DETECTED

## 2015-09-24 LAB — SURESWAB, VAGINOSIS/VAGINITIS PLUS
ATOPOBIUM VAGINAE: NOT DETECTED Log (cells/mL)
BV CATEGORY: UNDETERMINED — AB
C. ALBICANS, DNA: DETECTED — AB
C. GLABRATA, DNA: NOT DETECTED
C. PARAPSILOSIS, DNA: NOT DETECTED
C. TRACHOMATIS RNA, TMA: NOT DETECTED
C. tropicalis, DNA: NOT DETECTED
Gardnerella vaginalis: 6.8 Log (cells/mL)
LACTOBACILLUS SPECIES: 6.7 Log (cells/mL)
MEGASPHAERA SPECIES: NOT DETECTED Log (cells/mL)
N. gonorrhoeae RNA, TMA: NOT DETECTED
T. VAGINALIS RNA, QL TMA: NOT DETECTED

## 2015-09-28 ENCOUNTER — Other Ambulatory Visit: Payer: Self-pay | Admitting: Certified Nurse Midwife

## 2015-09-28 DIAGNOSIS — B3731 Acute candidiasis of vulva and vagina: Secondary | ICD-10-CM

## 2015-09-28 DIAGNOSIS — N760 Acute vaginitis: Secondary | ICD-10-CM

## 2015-09-28 DIAGNOSIS — B9689 Other specified bacterial agents as the cause of diseases classified elsewhere: Secondary | ICD-10-CM

## 2015-09-28 DIAGNOSIS — B373 Candidiasis of vulva and vagina: Secondary | ICD-10-CM

## 2015-09-28 MED ORDER — FLUCONAZOLE 100 MG PO TABS: 100.0000 mg | ORAL_TABLET | Freq: Once | ORAL | Status: DC

## 2015-09-28 MED ORDER — METRONIDAZOLE 500 MG PO TABS: 500.0000 mg | ORAL_TABLET | Freq: Two times a day (BID) | ORAL | Status: DC

## 2015-09-28 MED ORDER — TERCONAZOLE 0.4 % VA CREA: 1.0000 | TOPICAL_CREAM | Freq: Every day | VAGINAL | Status: DC

## 2015-11-21 ENCOUNTER — Ambulatory Visit (INDEPENDENT_AMBULATORY_CARE_PROVIDER_SITE_OTHER): Payer: 59 | Admitting: Family Medicine

## 2015-11-21 ENCOUNTER — Encounter: Payer: Self-pay | Admitting: Family Medicine

## 2015-11-21 VITALS — BP 110/70 | HR 68 | Temp 98.1°F | Ht 63.75 in | Wt 199.0 lb

## 2015-11-21 DIAGNOSIS — Z0000 Encounter for general adult medical examination without abnormal findings: Secondary | ICD-10-CM

## 2015-11-21 DIAGNOSIS — R002 Palpitations: Secondary | ICD-10-CM

## 2015-11-21 DIAGNOSIS — M25561 Pain in right knee: Secondary | ICD-10-CM

## 2015-11-21 NOTE — Progress Notes (Signed)
HPI:  Here for CPE. She had extensive lab work and her pap smear with her gynecologist a few months ago.  -Concerns and/or follow up today:  Palpitations: -felt heart race briefly 3 times in the last few weeks at rest -no CP, SOB, DOE, feeling sick during spells or any other symptoms -denies symptoms with activity -has been on and off contrave recently for weight loss - but only took for about 1.5 weeks -drinks caffeine  R knee pain: -for several months -fell on this knee about 4 months ago -worse with stairs and zumba -some crackling under knee cap -no giving away, locking, catching, weakness or numbness   -Diet: variety of foods, balance and well rounded, larger portion sizes  -Exercise:  regular exercise - zumba class 2 days per week  -Taking folic acid, vitamin D or calcium: no  -Diabetes and Dyslipidemia Screening: done, reviewed   -Hx of HTN: no  -Vaccines: UTD  -pap history: UTD - does with gyn  -sexual activity: yes, female partner, no new partners  -wants STI testing (Hep C if born 40-65): no   -Alcohol, Tobacco, drug use: see social history  Review of Systems - no fevers, unintentional weight loss, vision loss, hearing loss, chest pain, sob, hemoptysis, melena, hematochezia, hematuria, genital discharge, changing or concerning skin lesions, bleeding, bruising, loc, thoughts of self harm or SI  Past Medical History  Diagnosis Date  . Allergy     Past Surgical History  Procedure Laterality Date  . Hammertoe correction    . Breast surgery      Reduction     Family History  Problem Relation Age of Onset  . Hypertension Mother   . Stroke Mother     Social History   Social History  . Marital Status: Single    Spouse Name: N/A  . Number of Children: N/A  . Years of Education: N/A   Social History Main Topics  . Smoking status: Never Smoker   . Smokeless tobacco: Never Used  . Alcohol Use: 0.0 oz/week    0 Standard drinks or equivalent per  week     Comment: occasional  . Drug Use: No  . Sexual Activity: Yes    Birth Control/ Protection: Condom, IUD   Other Topics Concern  . None   Social History Narrative   Work or School: Chartered certified accountant, [employer], [employer]      Home Situation: lives with 3 kids (17,15, 9)      Spiritual Beliefs: none      Lifestyle: diet is so so; has been trying to exercising              Current outpatient prescriptions:  .  cetirizine (ZYRTEC) 10 MG tablet, Take 10 mg by mouth daily., Disp: , Rfl:  .  levonorgestrel (MIRENA) 20 MCG/24HR IUD, 1 each by Intrauterine route once., Disp: , Rfl:   EXAM:  Filed Vitals:   11/21/15 1511  BP: 110/70  Pulse: 68  Temp: 98.1 F (36.7 C)    GENERAL: vitals reviewed and listed below, alert, oriented, appears well hydrated and in no acute distress  HEENT: head atraumatic, PERRLA, normal appearance of eyes, ears, nose and mouth. moist mucus membranes.  NECK: supple, no masses or lymphadenopathy  LUNGS: clear to auscultation bilaterally, no rales, rhonchi or wheeze  CV: HRRR, no peripheral edema or cyanosis, normal pedal pulses  BREAST: declined  ABDOMEN: bowel sounds normal, soft, non tender to palpation, no masses, no rebound or guarding  GU: declined  SKIN: no rash or abnormal lesions  MS: normal gait, moves all extremities normally, no swelling on inspection of knees, no obvious deformity, R patellar creptitus, VMP atrophy R, J sign bilat, TTP around the R knee cap, neg lachman, neg val/var stress  NEURO: CN II-XII grossly intact, normal muscle strength and sensation to light touch on extremities  PSYCH: normal affect, pleasant and cooperative  ASSESSMENT AND PLAN:  Discussed the following assessment and plan:  Visit for preventive health examination -Discussed and advised all [place] preventive services health task force level A and B recommendations for age, sex and risks. -Advised at least 150 minutes of exercise per week and a  healthy diet low in saturated fats and sweets and consisting of fresh fruits and vegetables, lean meats such as fish and white chicken and whole grains. -labs, studies and vaccines per orders this encounter  Palpitations - Plan: EKG 12-Lead -symptoms sound like benign palpitations -EKG with mild brady (she reports has always had low HR) and mild T wave abnormality in V3 -advised against weight loss medication as hard on the heart -consider cards eval if worsening or persistent  Recurrent knee pain, right -suspect PFS, OA -she opted to do trial HEP and follow up in 1 month   Orders Placed This Encounter  Procedures  . EKG 12-Lead    Patient advised to return to clinic immediately if symptoms worsen or persist or new concerns.  Patient Instructions  BEFORE YOU LEAVE: -Patellofem exercises exercises -permission and obtain EKG from [facility] -follow up in 1 month  Let us know if you decide to see the cardiologist  I would avoid weight loss medications  Do the Exercises for the knee 4 days per week  We recommend the following healthy lifestyle measures: - eat a healthy whole foods diet consisting of regular small meals composed of vegetables, fruits, beans, nuts, seeds, healthy meats such as white chicken and fish and whole grains.  - avoid sweets, white starchy foods, fried foods, fast food, processed foods, sodas, red meet and other fattening foods.  - get a least 150-300 minutes of aerobic exercise per week.      No Follow-up on file.  Colin Benton R.

## 2015-11-21 NOTE — Patient Instructions (Signed)
BEFORE YOU LEAVE: -Patellofem exercises exercises -permission and obtain EKG from [facility] -follow up in 1 month  Let us know if you decide to see the cardiologist  I would avoid weight loss medications  Do the Exercises for the knee 4 days per week  We recommend the following healthy lifestyle measures: - eat a healthy whole foods diet consisting of regular small meals composed of vegetables, fruits, beans, nuts, seeds, healthy meats such as white chicken and fish and whole grains.  - avoid sweets, white starchy foods, fried foods, fast food, processed foods, sodas, red meet and other fattening foods.  - get a least 150-300 minutes of aerobic exercise per week.

## 2015-11-21 NOTE — Progress Notes (Signed)
Pre visit review using our clinic review tool, if applicable. No additional management support is needed unless otherwise documented below in the visit note. 

## 2015-12-22 ENCOUNTER — Ambulatory Visit: Payer: Self-pay | Admitting: Family Medicine

## 2016-01-09 ENCOUNTER — Encounter: Payer: Self-pay | Admitting: Family Medicine

## 2016-01-09 ENCOUNTER — Ambulatory Visit (INDEPENDENT_AMBULATORY_CARE_PROVIDER_SITE_OTHER): Payer: BLUE CROSS/BLUE SHIELD | Admitting: Family Medicine

## 2016-01-09 VITALS — BP 120/82 | HR 79 | Temp 98.6°F | Ht 63.75 in | Wt 196.1 lb

## 2016-01-09 DIAGNOSIS — M25561 Pain in right knee: Secondary | ICD-10-CM | POA: Diagnosis not present

## 2016-01-09 DIAGNOSIS — R002 Palpitations: Secondary | ICD-10-CM

## 2016-01-09 DIAGNOSIS — E669 Obesity, unspecified: Secondary | ICD-10-CM

## 2016-01-09 NOTE — Patient Instructions (Signed)
BEFORE YOU LEAVE: -schedule follow up in about 6 months  Keep up on the leg exercises about 3-4 days per week   Congratulations on the healthy lifestyle!  We recommend the following healthy lifestyle measures: - eat a healthy whole foods diet consisting of regular small meals composed of vegetables, fruits, beans, nuts, seeds, healthy meats such as white chicken and fish and whole grains.  - avoid sweets, white starchy foods, fried foods, fast food, processed foods, sodas, red meet and other fattening foods.  - get a least 150-300 minutes of aerobic exercise per week.

## 2016-01-09 NOTE — Progress Notes (Signed)
HPI:  Obesity: -stopped wt loss medications -doing healthy diet and going to the gym instead -has lost 4 lbs and feels much better!  Palpitations: -stopped contrave and cut back on caffiene -symptoms resolved, no further palpitations -no CP, SOB, DOE, feeling sick during spells or any other symptoms -occ brief vertigo - this is chronic and unchanged  R knee pain: -for several months - ? PFS and HEP given last visit -reports much better despite increased aerobic exercises - she continues to do th HEP -fell on this knee aummer 2016 -no giving away, locking, catching, weakness or numbness  ROS: See pertinent positives and negatives per HPI.  Past Medical History  Diagnosis Date  . Allergy     Past Surgical History  Procedure Laterality Date  . Hammertoe correction    . Breast surgery      Reduction     Family History  Problem Relation Age of Onset  . Hypertension Mother   . Stroke Mother     Social History   Social History  . Marital Status: Single    Spouse Name: N/A  . Number of Children: N/A  . Years of Education: N/A   Social History Main Topics  . Smoking status: Never Smoker   . Smokeless tobacco: Never Used  . Alcohol Use: 0.0 oz/week    0 Standard drinks or equivalent per week     Comment: occasional  . Drug Use: No  . Sexual Activity: Yes    Birth Control/ Protection: Condom, IUD   Other Topics Concern  . None   Social History Narrative   Work or School: Chartered certified accountant, [employer], [employer] unit      Home Situation: lives with 3 kids (17,15, 9)      Spiritual Beliefs: none      Lifestyle: diet is so so; has been trying to exercising              Current outpatient prescriptions:  .  cetirizine (ZYRTEC) 10 MG tablet, Take 10 mg by mouth daily., Disp: , Rfl:  .  levonorgestrel (MIRENA) 20 MCG/24HR IUD, 1 each by Intrauterine route once., Disp: , Rfl:   EXAM:  Filed Vitals:   01/09/16 0826  BP: 120/82  Pulse: 79  Temp: 98.6 F (37 C)     Body mass index is 33.94 kg/(m^2).  GENERAL: vitals reviewed and listed above, alert, oriented, appears well hydrated and in no acute distress  HEENT: atraumatic, conjunttiva clear, no obvious abnormalities on inspection of external nose and ears  NECK: no obvious masses on inspection  LUNGS: clear to auscultation bilaterally, no wheezes, rales or rhonchi, good air movement  CV: HRRR, no peripheral edema  MS: moves all extremities without noticeable abnormality, normal ROM bilat knees, no effusions, minimal R patellar crepitus, neg J sign, gait normal, no sig TTP  PSYCH: pleasant and cooperative, no obvious depression or anxiety  ASSESSMENT AND PLAN:  Discussed the following assessment and plan:  Palpitations -resolved, monitor  Obesity -continue healthy lifestyle, congratulated on progress, stress importance of lifelong commitment to healthy lifestyle, likelihood of plateaus in wt  Right knee pain -improved, suspect at least partially due to PFS - continue exercises and wt reduction  -Patient advised to return or notify a doctor immediately if symptoms worsen or persist or new concerns arise.  Patient Instructions  BEFORE YOU LEAVE: -schedule follow up in about 6 months  Keep up on the leg exercises about 3-4 days per week   Congratulations on  the healthy lifestyle!  We recommend the following healthy lifestyle measures: - eat a healthy whole foods diet consisting of regular small meals composed of vegetables, fruits, beans, nuts, seeds, healthy meats such as white chicken and fish and whole grains.  - avoid sweets, white starchy foods, fried foods, fast food, processed foods, sodas, red meet and other fattening foods.  - get a least 150-300 minutes of aerobic exercise per week.       Colin Benton R.

## 2016-01-09 NOTE — Progress Notes (Signed)
Pre visit review using our clinic review tool, if applicable. No additional management support is needed unless otherwise documented below in the visit note. 

## 2016-02-02 ENCOUNTER — Ambulatory Visit (INDEPENDENT_AMBULATORY_CARE_PROVIDER_SITE_OTHER): Payer: BLUE CROSS/BLUE SHIELD | Admitting: Certified Nurse Midwife

## 2016-02-02 VITALS — BP 144/91 | HR 73 | Wt 196.0 lb

## 2016-02-02 DIAGNOSIS — Z30431 Encounter for routine checking of intrauterine contraceptive device: Secondary | ICD-10-CM

## 2016-02-02 DIAGNOSIS — N760 Acute vaginitis: Secondary | ICD-10-CM

## 2016-02-02 DIAGNOSIS — K640 First degree hemorrhoids: Secondary | ICD-10-CM | POA: Diagnosis not present

## 2016-02-02 DIAGNOSIS — N390 Urinary tract infection, site not specified: Secondary | ICD-10-CM

## 2016-02-02 LAB — POCT URINALYSIS DIPSTICK
Bilirubin, UA: NEGATIVE
Blood, UA: 250
Glucose, UA: NEGATIVE
Ketones, UA: NEGATIVE
NITRITE UA: NEGATIVE
PH UA: 5
Spec Grav, UA: 1.015
UROBILINOGEN UA: NEGATIVE

## 2016-02-02 MED ORDER — FLUCONAZOLE 100 MG PO TABS: 100.0000 mg | ORAL_TABLET | Freq: Once | ORAL | Status: DC

## 2016-02-02 MED ORDER — HYDROCORTISONE ACETATE 25 MG RE SUPP: 25.0000 mg | Freq: Two times a day (BID) | RECTAL | Status: DC

## 2016-02-02 MED ORDER — NITROFURANTOIN MONOHYD MACRO 100 MG PO CAPS: 100.0000 mg | ORAL_CAPSULE | Freq: Two times a day (BID) | ORAL | Status: AC

## 2016-02-02 MED ORDER — PHENAZOPYRIDINE HCL 95 MG PO TABS: 95.0000 mg | ORAL_TABLET | Freq: Three times a day (TID) | ORAL | Status: DC | PRN

## 2016-02-02 MED ORDER — TERCONAZOLE 0.4 % VA CREA: 1.0000 | TOPICAL_CREAM | Freq: Every day | VAGINAL | Status: DC

## 2016-02-02 MED FILL — FLUCONAZOLE 100 MG TABLET: 100 | 3 days supply | Qty: 3 | Fill #0

## 2016-02-02 MED FILL — NITROFURANTOIN MONO-MCR 100: 100 | 7 days supply | Qty: 14 | Fill #0

## 2016-02-02 MED FILL — TERCONAZOLE 0.4% VAG CREAM: 0.4 | 7 days supply | Qty: 45 | Fill #0

## 2016-02-02 NOTE — Progress Notes (Signed)
Patient ID: [name], female   DOB: 1981/01/25, 35 y.o.   MRN: [id number]  Chief Complaint  Patient presents with  . Vaginitis    std check, ?yeast inf, ? hemorrhoid    HPI Ashley Summers is a 35 y.o. female.  Patient is here for STD check.  Is currently sexually active.  Has noticed a change in her vaginal discharge for 2 days with itching.  Has urinary symptoms of burning and urgency.  Is also having perianal pain and spotting after having diarrhea for about a week.  Does not have a hx of hemorrhoids.    HPI  Past Medical History  Diagnosis Date  . Allergy     Past Surgical History  Procedure Laterality Date  . Hammertoe correction    . Breast surgery      Reduction     Family History  Problem Relation Age of Onset  . Hypertension Mother   . Stroke Mother     Social History Social History  Substance Use Topics  . Smoking status: Never Smoker   . Smokeless tobacco: Never Used  . Alcohol Use: 0.0 oz/week    0 Standard drinks or equivalent per week     Comment: occasional    Allergies  Allergen Reactions  . Penicillins Rash    Current Outpatient Prescriptions  Medication Sig Dispense Refill  . levonorgestrel (MIRENA) 20 MCG/24HR IUD 1 each by Intrauterine route once.    . cetirizine (ZYRTEC) 10 MG tablet Take 10 mg by mouth daily. Reported on 02/02/2016    . fluconazole (DIFLUCAN) 100 MG tablet Take 1 tablet (100 mg total) by mouth once. Repeat dose in 48-72 hour. 3 tablet 0  . hydrocortisone (ANUSOL-HC) 25 MG suppository Place 1 suppository (25 mg total) rectally 2 (two) times daily. 12 suppository 6  . nitrofurantoin, macrocrystal-monohydrate, (MACROBID) 100 MG capsule Take 1 capsule (100 mg total) by mouth 2 (two) times daily. 14 capsule 0  . phenazopyridine (PYRIDIUM) 95 MG tablet Take 1 tablet (95 mg total) by mouth 3 (three) times daily as needed for pain. 30 tablet 0  . terconazole (TERAZOL 7) 0.4 % vaginal cream Place 1 applicator vaginally at bedtime.  45 g 0   No current facility-administered medications for this visit.    Review of Systems Review of Systems Constitutional: negative for fatigue and weight loss Respiratory: negative for cough and wheezing Cardiovascular: negative for chest pain, fatigue and palpitations Gastrointestinal: negative for abdominal pain and change in bowel habits Genitourinary: + vaginal discharge, + purititis Integument/breast: negative for nipple discharge Musculoskeletal:negative for myalgias Neurological: negative for gait problems and tremors Behavioral/Psych: negative for abusive relationship, depression Endocrine: negative for temperature intolerance     Blood pressure 144/91, pulse 73, weight 196 lb (88.905 kg).  Physical Exam Physical Exam General:   alert  Skin:   no rash or abnormalities  Lungs:   clear to auscultation bilaterally  Heart:   regular rate and rhythm, S1, S2 normal, no murmur, click, rub or gallop  Breasts:   deferred  Abdomen:  normal findings: no organomegaly, soft, non-tender and no hernia  Anus:   + small hemorrhoid, + erythema.    Pelvis:  External genitalia: normal general appearance Urinary system: urethral meatus normal and bladder without fullness, nontender Vaginal: normal without tenderness, induration or masses Cervix: no CMT, + white chunky vaginal discharge, + IUD strings present Adnexa: normal bimanual exam Uterus: anteverted and non-tender, normal size    50% of 20 min  visit spent on counseling and coordination of care.   Data Reviewed Previous medical hx, meds, labs  Assessment     Hemorrhoids Vaginitis UTI     Plan    Orders Placed This Encounter  Procedures  . SureSwab, Vaginosis/Vaginitis Plus  . Urine culture  . POCT urinalysis dipstick   Meds ordered this encounter  Medications  . hydrocortisone (ANUSOL-HC) 25 MG suppository    Sig: Place 1 suppository (25 mg total) rectally 2 (two) times daily.    Dispense:  12 suppository     Refill:  6  . fluconazole (DIFLUCAN) 100 MG tablet    Sig: Take 1 tablet (100 mg total) by mouth once. Repeat dose in 48-72 hour.    Dispense:  3 tablet    Refill:  0  . terconazole (TERAZOL 7) 0.4 % vaginal cream    Sig: Place 1 applicator vaginally at bedtime.    Dispense:  45 g    Refill:  0  . nitrofurantoin, macrocrystal-monohydrate, (MACROBID) 100 MG capsule    Sig: Take 1 capsule (100 mg total) by mouth 2 (two) times daily.    Dispense:  14 capsule    Refill:  0  . phenazopyridine (PYRIDIUM) 95 MG tablet    Sig: Take 1 tablet (95 mg total) by mouth 3 (three) times daily as needed for pain.    Dispense:  30 tablet    Refill:  0     Follow up as needed.

## 2016-02-03 ENCOUNTER — Encounter: Payer: Self-pay | Admitting: Certified Nurse Midwife

## 2016-02-03 DIAGNOSIS — Z30431 Encounter for routine checking of intrauterine contraceptive device: Secondary | ICD-10-CM | POA: Insufficient documentation

## 2016-02-03 DIAGNOSIS — K649 Unspecified hemorrhoids: Secondary | ICD-10-CM | POA: Insufficient documentation

## 2016-02-04 LAB — URINE CULTURE

## 2016-02-06 LAB — SURESWAB, VAGINOSIS/VAGINITIS PLUS
Atopobium vaginae: 6.7 Log (cells/mL)
BV CATEGORY: UNDETERMINED — AB
C. GLABRATA, DNA: NOT DETECTED
C. PARAPSILOSIS, DNA: NOT DETECTED
C. TROPICALIS, DNA: NOT DETECTED
C. albicans, DNA: DETECTED — AB
C. trachomatis RNA, TMA: NOT DETECTED
LACTOBACILLUS SPECIES: 6.7 Log (cells/mL)
MEGASPHAERA SPECIES: 7.8 Log (cells/mL)
N. gonorrhoeae RNA, TMA: NOT DETECTED
T. VAGINALIS RNA, QL TMA: NOT DETECTED

## 2016-02-08 ENCOUNTER — Other Ambulatory Visit: Payer: Self-pay | Admitting: Certified Nurse Midwife

## 2016-04-02 ENCOUNTER — Encounter: Payer: Self-pay | Admitting: Family Medicine

## 2016-04-02 ENCOUNTER — Ambulatory Visit (INDEPENDENT_AMBULATORY_CARE_PROVIDER_SITE_OTHER): Payer: BLUE CROSS/BLUE SHIELD | Admitting: Family Medicine

## 2016-04-02 VITALS — BP 128/88 | HR 70 | Temp 98.3°F | Ht 63.75 in | Wt 196.7 lb

## 2016-04-02 DIAGNOSIS — M25561 Pain in right knee: Secondary | ICD-10-CM

## 2016-04-02 DIAGNOSIS — R69 Illness, unspecified: Secondary | ICD-10-CM

## 2016-04-02 DIAGNOSIS — R030 Elevated blood-pressure reading, without diagnosis of hypertension: Secondary | ICD-10-CM | POA: Diagnosis not present

## 2016-04-02 DIAGNOSIS — IMO0001 Reserved for inherently not codable concepts without codable children: Secondary | ICD-10-CM

## 2016-04-02 NOTE — Progress Notes (Signed)
Pre visit review using our clinic review tool, if applicable. No additional management support is needed unless otherwise documented below in the visit note. 

## 2016-04-02 NOTE — Progress Notes (Signed)
HPI:  Acute visit for 2 issues:  Elevated blood pressure: -She has been dealing with allergy and checked her blood pressure 1 day at [facility] -Low pressure was elevated at 160/96, she has checked it several times at work and has been okay except for one reading of 156/90s. -She is taking Zyrtec for her allergies, no other over-the-counter medications -Her diet has not been as good and she was not exercising until recently -no cp, sob, doe, swelling -FH htn in mother  R knee pain: - evaluation in the past for suspected patellofemoral syndrome, exercises resolved pain -Restarted aggressive exercise at the gym was squats, lunges, inclined treadmill last few weeks and has developed right knee pain again -no weakness, numbness, giving away -taking ibuprofen daily for this  ROS: See pertinent positives and negatives per HPI.  Past Medical History  Diagnosis Date  . Allergy     Past Surgical History  Procedure Laterality Date  . Hammertoe correction    . Breast surgery      Reduction     Family History  Problem Relation Age of Onset  . Hypertension Mother   . Stroke Mother     Social History   Social History  . Marital Status: Single    Spouse Name: N/A  . Number of Children: N/A  . Years of Education: N/A   Social History Main Topics  . Smoking status: Never Smoker   . Smokeless tobacco: Never Used  . Alcohol Use: 0.0 oz/week    0 Standard drinks or equivalent per week     Comment: occasional  . Drug Use: No  . Sexual Activity: Yes    Birth Control/ Protection: Condom, IUD   Other Topics Concern  . None   Social History Narrative   Work or School: Chartered certified accountant, [employer], [employer] unit      Home Situation: lives with 3 kids (17,15, 9)      Spiritual Beliefs: none      Lifestyle: diet is so so; has been trying to exercising              Current outpatient prescriptions:  .  cetirizine (ZYRTEC) 10 MG tablet, Take 10 mg by mouth daily. Reported on 02/02/2016, Disp:  , Rfl:  .  levonorgestrel (MIRENA) 20 MCG/24HR IUD, 1 each by Intrauterine route once., Disp: , Rfl:   EXAM:  Filed Vitals:   04/02/16 1609  BP: 128/88  Pulse: 70  Temp: 98.3 F (36.8 C)    Body mass index is 34.04 kg/(m^2).  GENERAL: vitals reviewed and listed above, alert, oriented, appears well hydrated and in no acute distress  HEENT: atraumatic, conjunttiva clear, no obvious abnormalities on inspection of external nose and ears  NECK: no obvious masses on inspection  LUNGS: clear to auscultation bilaterally, no wheezes, rales or rhonchi, good air movement  CV: HRRR, no peripheral edema  MS: moves all extremities without noticeable abnormality, normal gait, normal inspection of the knees, tenderness or patellar on the right, negative Lachman's, negative valgus varus stress  PSYCH: pleasant and cooperative, no obvious depression or anxiety  ASSESSMENT AND PLAN:  Discussed the following assessment and plan:  Elevated blood pressure -Okay on recheck, lifestyle recommendations -Stop NSAIDs -Follow up for recheck in 1 month  Recurrent knee pain, right -Suspect patellofemoral syndrome, worsened with recent aggressive exercises  -Activity modification, then slow building of activity  -Home exercises, she reports she still has these at home  -Tylenol and topical sports creams for pain  -  Follow up in 1 month, consider plain films if does not improve   No work restrictions.  -Patient advised to return or notify a doctor immediately if symptoms worsen or persist or new concerns arise.  Patient Instructions  Before you leave: -Schedule follow-up in 1 month  For the knee: -Please avoid incline treadmill exercises, resistance exercises, squats or lunges for now until he is feeling better -Please do the home exercises at least 3 days per week -You can use Tylenol [phone] mg up to 3 times a day and topical sports creams as needed for the knee pain -Please let me know if  this pain is persistent at your follow-up in 1 month  For the blood pressure: We recommend the following healthy lifestyle measures: - eat a healthy whole foods diet consisting of regular small meals composed of vegetables, fruits, beans, nuts, seeds, healthy meats such as white chicken and fish and whole grains.  - avoid sweets, white starchy foods, fried foods, fast food, processed foods, sodas, red meet and other fattening foods.  - get a least 150-300 minutes of aerobic exercise per week.       Colin Benton R.

## 2016-04-02 NOTE — Progress Notes (Signed)
LATE CANCEL/NO SHOW  

## 2016-04-02 NOTE — Patient Instructions (Signed)
Before you leave: -Schedule follow-up in 1 month  For the knee: -Please avoid incline treadmill exercises, resistance exercises, squats or lunges for now until he is feeling better -Please do the home exercises at least 3 days per week -You can use Tylenol [phone] mg up to 3 times a day and topical sports creams as needed for the knee pain -Please let me know if this pain is persistent at your follow-up in 1 month  For the blood pressure: We recommend the following healthy lifestyle measures: - eat a healthy whole foods diet consisting of regular small meals composed of vegetables, fruits, beans, nuts, seeds, healthy meats such as white chicken and fish and whole grains.  - avoid sweets, white starchy foods, fried foods, fast food, processed foods, sodas, red meet and other fattening foods.  - get a least 150-300 minutes of aerobic exercise per week.

## 2016-05-03 ENCOUNTER — Ambulatory Visit: Payer: BLUE CROSS/BLUE SHIELD | Admitting: Family Medicine

## 2016-05-07 ENCOUNTER — Telehealth: Payer: Self-pay | Admitting: *Deleted

## 2016-05-07 DIAGNOSIS — N760 Acute vaginitis: Secondary | ICD-10-CM

## 2016-05-07 DIAGNOSIS — B9689 Other specified bacterial agents as the cause of diseases classified elsewhere: Secondary | ICD-10-CM

## 2016-05-07 MED ORDER — METRONIDAZOLE 500 MG PO TABS: 500.0000 mg | ORAL_TABLET | Freq: Two times a day (BID) | ORAL | Status: DC

## 2016-05-07 MED FILL — metroNIDAZOLE 500 MG TABS: 500 | 7 days supply | Qty: 14 | Fill #0

## 2016-05-07 NOTE — Telephone Encounter (Signed)
Patient has symptoms of BV- she is having odor with no discharge.

## 2016-06-01 ENCOUNTER — Telehealth: Payer: Self-pay

## 2016-06-01 NOTE — Telephone Encounter (Signed)
PLEASE NOTE: All timestamps contained within this report are represented as [place] Standard Time. CONFIDENTIALTY NOTICE: This fax transmission is intended only for the addressee. It contains information that is legally privileged, confidential or otherwise protected from use or disclosure. If you are not the intended recipient, you are strictly prohibited from reviewing, disclosing, copying using or disseminating any of this information or taking any action in reliance on or regarding this information. If you have received this fax in error, please notify us immediately by telephone so that we can arrange for its return to [place]. Phone: [phone], Toll-Free: [phone], Fax: [phone] Page: 1 of 2 Call Id: [id number] [facility] - [facility] [place] Patient Name: Ashley Summers Gender: Female DOB: 03/14/1981 Age: 35 Y 3 M 23 D Return Phone Number: [phone] (Primary) Address: City/State/Zip: [place]  [address] Client [facility] - Client Client [facility] [place] - Day Physician [facility] - PHYSICIAN, NOT LISTED- MD Contact Type Call Who Is Calling Patient / Member / Family / Caregiver Call Type Triage / Clinical Relationship To Patient Self Return Phone Number [phone] (Primary) Chief Complaint Bee, Wasp, or Yellow Jacket Sting (no allergic symptoms) Reason for Call Symptomatic / Request for [place] states that she was stung by a bumblebee on Tuesday; her left leg is warm to the touch, painful and swollen Appointment Disposition EMR Appointment Not Necessary Info pasted into Epic No PreDisposition Go to Urgent Care/Walk-In Clinic Translation No Nurse Assessment Nurse: Denyse Amass, RN, Benjamine Mola Date/Time (Eastern Time): 05/31/2016 4:18:11 PM Confirm and document reason for call. If symptomatic, describe symptoms. You must click the next  button to save text entered. ---Patient states she was stung by a bee on the left upper leg. States it is red and swollen and hot to touch. States she has pain and itching. Has the patient traveled out of the country within the last 30 days? ---Not Applicable Does the patient have any new or worsening symptoms? ---Yes Will a triage be completed? ---Yes Related visit to physician within the last 2 weeks? ---No Does the PT have any chronic conditions? (i.e. diabetes, asthma, etc.) ---No Is the patient pregnant or possibly pregnant? (Ask all females between the ages of 66-55) ---No Is this a behavioral health or substance abuse call? ---No Guidelines Guideline Title Affirmed Question Affirmed Notes Nurse Date/Time (Eastern Time) Bee or Yellow Jacket Sting Normal local reaction to bee, wasp, or yellow jacket sting Denyse Amass, RN, Benjamine Mola 05/31/2016 4:20:57 PM Disp. Time Eilene Ghazi Time) Disposition Final User PLEASE NOTE: All timestamps contained within this report are represented as [place] Standard Time. CONFIDENTIALTY NOTICE: This fax transmission is intended only for the addressee. It contains information that is legally privileged, confidential or otherwise protected from use or disclosure. If you are not the intended recipient, you are strictly prohibited from reviewing, disclosing, copying using or disseminating any of this information or taking any action in reliance on or regarding this information. If you have received this fax in error, please notify us immediately by telephone so that we can arrange for its return to [place]. Phone: [phone], Toll-Free: [phone], Fax: [phone] Page: 2 of 2 Call Id: [id number] 05/31/2016 4:27:42 PM Boyce, RN, Lorin Glass Understands: Yes Disagree/Comply: Comply Care Advice Given Per Guideline HOME CARE: You should be able to treat this at home. REASSURANCE: * It sounds like a routine bee (wasp, yellow  jacket) sting that we can treat  at home. * The main symptoms are localized pain, swelling, itching, and mild redness at the sting site. APPLY LOCAL COLD - ICE MASSAGE METHOD * For pain, massage the area of the sting with an ice cube for 10 min prn. APPLY LOCAL COLD - COLD PACK METHOD: * Wrap a bag of ice in a towel. (or a bag of frozen vegetables, like peas) * Apply this cold pack to the area of the sting for 10-20 minutes. * You may repeat this as needed, to relieve symptoms of pain and swelling. MEAT TENDERIZER: * For immediate pain relief, apply a meat tenderizer-water solution on a cotton ball for 20 minutes ( EXCEPTION: Near the eye). This neutralizes the venom and decreases pain and swelling. Do this just once. * If not available, apply a baking soda solution for 20 minutes. Do this just once. PAIN MEDICINE: For pain relief, take acetaminophen. Typical adult dose is 650 mg. OTC ANTIHISTAMINE FOR ITCHING: * If the sting becomes itchy, take a dose of Benadryl (OTC diphenhydramine). * Typical adult dosage is 25-50 mg PO. CAUTION - ANTIHISTAMINES: * Examples include diphenhydramine (Benadryl) and chlorpheniramine (Chlortrimeton, Chlor-tripolon) * May cause sleepiness. Do not drink alcohol, drive or operate dangerous machinery while taking antihistamines. * Do not take these medicines if you have prostate problems. * Read the package instructions and warnings. HYDROCORTISONE CREAM FOR ITCHING: * Put 1% hydrocortisone cream on the sting 3 times a day. Use it for a couple days, until it feels better. This will help decrease the itching. * This is an over-the-counter (OTC) drug. You can buy it at the drugstore. * Some people like to keep the cream in the refridgerator. It feels even better if the cream is used when it is cold. * CAUTION: Do not use hydrocortisone cream for more than 1 week without talking to your doctor. * Read the instructions and warnings on the package insert for all medicines you  take. TETANUS: * Tetanus vaccination (booster) after a bee sting is not necessary. * However, if it has been more than 10 years since your last tetanus vaccination, it is appropriate from a preventive health care standpoint to obtain a vaccination (i.e., Td or TDaP) sometime in the next couple weeks. EXPECTED COURSE: * Pain: Severe pain or burning at the site lasts 1 to 2 hours. Pain after this period is usually minimal. Itching often follows the pain. * Redness and Swelling: Normal redness and swelling from the venom can increase for 24 hours following the sting. Redness at the sting site is normal. It doesn't mean that it is infected. The redness can last 3 days and the swelling 7 days. * Stings only rarely get infected. CALL BACK IF: * Develops difficulty breathing or swallowing (mainly during the 2 hours after the sting; Call 911) * Swelling becomes huge * Sting begins to look infected.

## 2016-06-01 NOTE — Telephone Encounter (Signed)
Looks like was given recs by nurse. Let her know about [facility] if needs appt. Thanks.

## 2016-07-03 ENCOUNTER — Encounter: Payer: Self-pay | Admitting: *Deleted

## 2016-07-09 ENCOUNTER — Telehealth: Payer: Self-pay | Admitting: *Deleted

## 2016-07-09 ENCOUNTER — Ambulatory Visit: Payer: BLUE CROSS/BLUE SHIELD | Admitting: Family Medicine

## 2016-07-09 DIAGNOSIS — Z0289 Encounter for other administrative examinations: Secondary | ICD-10-CM

## 2016-07-09 NOTE — Progress Notes (Deleted)
  HPI:  Elevated BP: -intermittently elsewhere -lifestyle recs advised, here for recheck  R knee pain: -hx PFS, lots of squats and lunges triggered flare a few months ago   ROS: See pertinent positives and negatives per HPI.  Past Medical History:  Diagnosis Date  . Allergy     Past Surgical History:  Procedure Laterality Date  . BREAST SURGERY     Reduction   . Hammertoe correction      Family History  Problem Relation Age of Onset  . Hypertension Mother   . Stroke Mother     Social History   Social History  . Marital status: Single    Spouse name: N/A  . Number of children: N/A  . Years of education: N/A   Social History Main Topics  . Smoking status: Never Smoker  . Smokeless tobacco: Never Used  . Alcohol use 0.0 oz/week     Comment: occasional  . Drug use: No  . Sexual activity: Yes    Birth control/ protection: Condom, IUD   Other Topics Concern  . Not on file   Social History Narrative   Work or School: Chartered certified accountant, [employer], [employer]      Home Situation: lives with 3 kids (17,15, 9)      Spiritual Beliefs: none      Lifestyle: diet is so so; has been trying to exercising              Current Outpatient Prescriptions:  .  cetirizine (ZYRTEC) 10 MG tablet, Take 10 mg by mouth daily. Reported on 02/02/2016, Disp: , Rfl:  .  levonorgestrel (MIRENA) 20 MCG/24HR IUD, 1 each by Intrauterine route once., Disp: , Rfl:  .  metroNIDAZOLE (FLAGYL) 500 MG tablet, Take 1 tablet (500 mg total) by mouth 2 (two) times daily., Disp: 14 tablet, Rfl: 0  EXAM:  There were no vitals filed for this visit.  There is no height or weight on file to calculate BMI.  GENERAL: vitals reviewed and listed above, alert, oriented, appears well hydrated and in no acute distress  HEENT: atraumatic, conjunttiva clear, no obvious abnormalities on inspection of external nose and ears  NECK: no obvious masses on inspection  LUNGS: clear to auscultation bilaterally, no  wheezes, rales or rhonchi, good air movement  CV: HRRR, no peripheral edema  MS: moves all extremities without noticeable abnormality  PSYCH: pleasant and cooperative, no obvious depression or anxiety  ASSESSMENT AND PLAN:  Discussed the following assessment and plan:  No diagnosis found.  -Patient advised to return or notify a doctor immediately if symptoms worsen or persist or new concerns arise.  There are no Patient Instructions on file for this visit.  Colin Benton R., DO

## 2016-07-09 NOTE — Telephone Encounter (Signed)
Patient was a no-show for today's visit.  I called the pt to check on her and she stated she forgot to call to cancel the appt as she just got a new job.  Stated she will check her schedule and call back for an appt.

## 2016-10-11 ENCOUNTER — Encounter: Payer: Self-pay | Admitting: Certified Nurse Midwife

## 2016-10-11 ENCOUNTER — Ambulatory Visit (INDEPENDENT_AMBULATORY_CARE_PROVIDER_SITE_OTHER): Payer: 59 | Admitting: Certified Nurse Midwife

## 2016-10-11 VITALS — BP 136/97 | HR 67 | Ht 64.0 in | Wt 201.0 lb

## 2016-10-11 DIAGNOSIS — Z113 Encounter for screening for infections with a predominantly sexual mode of transmission: Secondary | ICD-10-CM | POA: Diagnosis not present

## 2016-10-11 DIAGNOSIS — Z124 Encounter for screening for malignant neoplasm of cervix: Secondary | ICD-10-CM

## 2016-10-11 DIAGNOSIS — Z30431 Encounter for routine checking of intrauterine contraceptive device: Secondary | ICD-10-CM

## 2016-10-11 DIAGNOSIS — N946 Dysmenorrhea, unspecified: Secondary | ICD-10-CM

## 2016-10-11 DIAGNOSIS — Z01419 Encounter for gynecological examination (general) (routine) without abnormal findings: Secondary | ICD-10-CM

## 2016-10-11 DIAGNOSIS — Z0000 Encounter for general adult medical examination without abnormal findings: Secondary | ICD-10-CM

## 2016-10-11 DIAGNOSIS — N888 Other specified noninflammatory disorders of cervix uteri: Secondary | ICD-10-CM | POA: Diagnosis not present

## 2016-10-11 MED ORDER — TRAMADOL HCL 50 MG PO TABS: 50.0000 mg | ORAL_TABLET | Freq: Four times a day (QID) | ORAL | 4 refills | Status: DC | PRN

## 2016-10-11 NOTE — Progress Notes (Signed)
Last pap 09/2015- negative Pt is having cycle with IUD and cramping is worse. Pt would like all testing including STD, lab work.

## 2016-10-11 NOTE — Progress Notes (Signed)
Subjective:        Ashley Summers is a 35 y.o. female here for a routine exam.  Current complaints: IUD pain, cramping.  Desires full STD screening exam.     Mirena placed 06/24/14.  History of breast reduction surgery.  Reports severe cramping through out the month, has tried motrin.  Reports irregular bleeding with IUD.   Personal health questionnaire:  Is patient [name], have a family history of breast and/or ovarian cancer: no Is there a family history of uterine cancer diagnosed at age < 46, gastrointestinal cancer, urinary tract cancer, family member who is a Field seismologist syndrome-associated carrier: no Is the patient overweight and hypertensive, family history of diabetes, personal history of gestational diabetes, preeclampsia or PCOS: yes Is patient over 72, have PCOS,  family history of premature CHD under age 40, diabetes, smoke, have hypertension or peripheral artery disease:  Yes, mother CVA before 29 yrs of age, HTN; MGM && maternal aunt DM At any time, has a partner hit, kicked or otherwise hurt or frightened you?: no Over the past 2 weeks, have you felt down, depressed or hopeless?: no Over the past 2 weeks, have you felt little interest or pleasure in doing things?:no    Gynecologic History No LMP recorded. Contraception: IUD Last Pap: 09/20/15. Results were: normal Last mammogram: n/a.   Obstetric History OB History  Gravida Para Term Preterm AB Living  3 3 1 2   3   SAB TAB Ectopic Multiple Live Births          3    # Outcome Date GA Lbr Len/2nd Weight Sex Delivery Anes PTL Lv  3 Term 10/13/04 [redacted]w[redacted]d   F VBAC None  LIV  2 Preterm 09/07/99 [redacted]w[redacted]d   M CS-LTranv EPI  LIV  1 Preterm 03/21/97 [redacted]w[redacted]d   M Vag-Spont None  LIV      Past Medical History:  Diagnosis Date  . Allergy     Past Surgical History:  Procedure Laterality Date  . BREAST SURGERY     Reduction   . Hammertoe correction       Current Outpatient Prescriptions:  .  cetirizine (ZYRTEC) 10  MG tablet, Take 10 mg by mouth daily. Reported on 02/02/2016, Disp: , Rfl:  .  levonorgestrel (MIRENA) 20 MCG/24HR IUD, 1 each by Intrauterine route once., Disp: , Rfl:  Allergies  Allergen Reactions  . Penicillins Rash    Social History  Substance Use Topics  . Smoking status: Never Smoker  . Smokeless tobacco: Never Used  . Alcohol use 0.0 oz/week     Comment: occasional    Family History  Problem Relation Age of Onset  . Hypertension Mother   . Stroke Mother       Review of Systems  Constitutional: negative for fatigue and weight loss Respiratory: negative for cough and wheezing Cardiovascular: negative for chest pain, fatigue and palpitations Gastrointestinal: negative for abdominal pain and change in bowel habits Musculoskeletal:negative for myalgias Neurological: negative for gait problems and tremors Behavioral/Psych: negative for abusive relationship, depression Endocrine: negative for temperature intolerance    Genitourinary:negative for abnormal menstrual periods, genital lesions, hot flashes, sexual problems and vaginal discharge, + dysmenorrhea, irregular periods with IUD Integument/breast: negative for breast lump, breast tenderness, nipple discharge and skin lesion(s)    Objective:       BP (!) 136/97   Pulse 67   Ht 5\' 4"  (1.626 m)   Wt 201 lb (91.2 kg)   BMI 34.50 kg/m  General:   alert  Skin:   no rash or abnormalities  Lungs:   clear to auscultation bilaterally  Heart:   regular rate and rhythm, S1, S2 normal, no murmur, click, rub or gallop  Breasts:   normal without suspicious masses, skin or nipple changes or axillary nodes  Abdomen:  normal findings: no organomegaly, soft, non-tender and no hernia  Pelvis:  External genitalia: normal general appearance Urinary system: urethral meatus normal and bladder without fullness, nontender Vaginal: normal without tenderness, induration or masses Cervix: normal appearance, friable, IUD strings  present Adnexa: normal bimanual exam Uterus: anteverted and non-tender, normal size   Lab Review Urine pregnancy test Labs reviewed yes Radiologic studies reviewed no  50% of 30 min visit spent on counseling and coordination of care.    Assessment:    Healthy female exam.   Friable cervix  Dysmenorrhea  IUD check up  STD screening exam  Obesity   Plan:    Education reviewed: calcium supplements, depression evaluation, low fat, low cholesterol diet, safe sex/STD prevention, self breast exams, skin cancer screening and weight bearing exercise. Contraception: IUD. Follow up in: 1 year.   No orders of the defined types were placed in this encounter.  No orders of the defined types were placed in this encounter.   Possible management options include: PUS for dysmenorrhea Follow up as needed.

## 2016-10-12 LAB — HEPATITIS B SURFACE ANTIGEN: Hepatitis B Surface Ag: NEGATIVE

## 2016-10-12 LAB — HIV ANTIBODY (ROUTINE TESTING W REFLEX): HIV Screen 4th Generation wRfx: NONREACTIVE

## 2016-10-12 LAB — RPR: RPR: NONREACTIVE

## 2016-10-12 LAB — HEPATITIS C ANTIBODY

## 2016-10-14 LAB — NUSWAB VG+, CANDIDA 6SP
CANDIDA ALBICANS, NAA: NEGATIVE
CANDIDA LUSITANIAE, NAA: NEGATIVE
CANDIDA PARAPSILOSIS, NAA: NEGATIVE
CHLAMYDIA TRACHOMATIS, NAA: NEGATIVE
Candida glabrata, NAA: NEGATIVE
Candida krusei, NAA: NEGATIVE
Candida tropicalis, NAA: NEGATIVE
NEISSERIA GONORRHOEAE, NAA: NEGATIVE
TRICH VAG BY NAA: NEGATIVE

## 2016-10-16 LAB — CYTOLOGY - PAP
Diagnosis: NEGATIVE
HPV: NOT DETECTED

## 2016-10-18 ENCOUNTER — Other Ambulatory Visit: Payer: Self-pay | Admitting: Certified Nurse Midwife

## 2016-10-18 ENCOUNTER — Telehealth: Payer: Self-pay | Admitting: *Deleted

## 2016-10-18 NOTE — Telephone Encounter (Signed)
She would need to be evaluated by gastroenterology.  Please have her start on Miralax first.  Thank you.  R.Anagabriela Jokerst CNM

## 2016-10-18 NOTE — Telephone Encounter (Signed)
Patient states when she was seen last week- she discussed her constipation issues with the provider and Miralax was recommended. She has a friend who is using Linzess and she wants to know if she could possibly try that?

## 2016-10-19 NOTE — Telephone Encounter (Signed)
Patient notified

## 2016-11-29 NOTE — Progress Notes (Signed)
HPI:  Here for CPE:  -Concerns and/or follow up today:  Snoring: -has gained weight and reports daughter saw she makes awful sounds in her sleep -admits to terrible unrestful sleep and daytime somnolence -wishes to be evaluated for OSA  -Diet: poor diet  -Exercise: no regular exercise  -Taking folic acid, vitamin D or calcium: no  -Diabetes and Dyslipidemia Screening: FASTING for labs  -Hx of HTN: no  -Vaccines: UTD  -pap history: sees gyn, annual exam 10/2016  -wants STI testing (Hep C if born [birth year]): no, sees gyn  -FH breast, colon or ovarian ca: see FH Last mammogram: breast exam with gyn Last colon cancer screening: n/a  -Alcohol, Tobacco, drug use: see social history  Review of Systems - no fevers, unintentional weight loss, vision loss, hearing loss, chest pain, sob, hemoptysis, melena, hematochezia, hematuria, genital discharge, changing or concerning skin lesions, bleeding, bruising, loc, thoughts of self harm or SI  Past Medical History:  Diagnosis Date  . Allergy     Past Surgical History:  Procedure Laterality Date  . BREAST SURGERY     Reduction   . Hammertoe correction      Family History  Problem Relation Age of Onset  . Hypertension Mother   . Stroke Mother     Social History   Social History  . Marital status: Single    Spouse name: N/A  . Number of children: N/A  . Years of education: N/A   Social History Main Topics  . Smoking status: Never Smoker  . Smokeless tobacco: Never Used  . Alcohol use 0.0 oz/week     Comment: occasional  . Drug use: No  . Sexual activity: Yes    Birth control/ protection: Condom, IUD   Other Topics Concern  . None   Social History Narrative   Work or School: Chartered certified accountant, [employer], [employer]      Home Situation: lives with 3 kids (17,15, 9)      Spiritual Beliefs: none      Lifestyle: diet is so so; has been trying to exercising              Current Outpatient Prescriptions:  .   cetirizine (ZYRTEC) 10 MG tablet, Take 10 mg by mouth daily. Reported on 02/02/2016, Disp: , Rfl:  .  levonorgestrel (MIRENA) 20 MCG/24HR IUD, 1 each by Intrauterine route once., Disp: , Rfl:  .  traMADol (ULTRAM) 50 MG tablet, Take 1 tablet (50 mg total) by mouth every 6 (six) hours as needed for moderate pain or severe pain., Disp: 60 tablet, Rfl: 4  EXAM:  Vitals:   11/30/16 1121  BP: 136/80  Pulse: 60  Temp: 98.5 F (36.9 C)   Body mass index is 34.86 kg/m.  GENERAL: vitals reviewed and listed below, alert, oriented, appears well hydrated and in no acute distress  HEENT: head atraumatic, PERRLA, normal appearance of eyes, ears, nose and mouth. moist mucus membranes.  NECK: supple, no masses or lymphadenopathy  LUNGS: clear to auscultation bilaterally, no rales, rhonchi or wheeze  CV: HRRR, no peripheral edema or cyanosis, normal pedal pulses  ABDOMEN: bowel sounds normal, soft, non tender to palpation, no masses, no rebound or guarding  SKIN: no rash or abnormal lesions  GU/BREAST: declined  MS: normal gait, moves all extremities normally  NEURO: normal gait, speech and thought processing grossly intact, muscle tone grossly intact throughout  PSYCH: normal affect, pleasant and cooperative  ASSESSMENT AND PLAN:  Discussed the following assessment and  plan:  Encounter for preventive health examination - Plan: Lipid Panel, Hemoglobin A1c  -Discussed and advised all [place] preventive services health task force level A and B recommendations for age, sex and risks. -Advised at least 150 minutes of exercise per week and a healthy dies, studies and vaccines per orders this encounter -labs per orders -women's health exam done with gyn  BMI 34.0-34.9,adult - Plan: Lipid Panel, Hemoglobin A1c -lifestyle recs discussed at length - summarized below  Daytime somnolence - Plan: TSH, CBC, Ambulatory referral to Pulmonology -labs to r/o other causes fatigue -pulm eval for  OSA -advised weight reduction - healthy lifestyle recs  Snoring - Plan: Ambulatory referral to Pulmonology   Orders Placed This Encounter  Procedures  . Lipid Panel  . Hemoglobin A1c  . TSH  . CBC  . Ambulatory referral to Pulmonology    Referral Priority:   Routine    Referral Type:   Consultation    Referral Reason:   Specialty Services Required    Requested Specialty:   Pulmonary Disease    Number of Visits Requested:   1    Patient advised to return to clinic immediately if symptoms worsen or persist or new concerns.  Patient Instructions  BEFORE YOU LEAVE: -follow up: 6 months -labs  Vit D3 [phone] IU daily (sam's club brand was recently approved by [employer])  -We placed a referral for you as discussed for evaluation for sleep apnea. It usually takes about 1-2 weeks to process and schedule this referral. If you have not heard from [place] regarding this appointment in 2 weeks please contact our office.  We have ordered labs or studies at this visit. It can take up to 1-2 weeks for results and processing. IF results require follow up or explanation, we will call you with instructions. Clinically stable results will be released to your [facility]. If you have not heard from [place] or cannot find your results in [facility] in 2 weeks please contact our office at [phone].  If you are not yet signed up for [facility], please consider signing up.   We recommend the following healthy lifestyle for LIFE: 1) Small portions.   Tip: eat off of a salad plate instead of a dinner plate.  Tip: It is ok to feel hungry after a meal of proper portion sizes  Tip: if you need more or a snack choose fruits, veggies and/or a handful of nuts or seeds.  2) Eat a healthy clean diet.  * Tip: Avoid (less then 1 serving per week): processed foods, sweets, sweetened drinks, white starches (rice, flour, bread, potatoes, pasta, etc), red meat, fast foods, butter  *Tip: CHOOSE instead   * 5-9 servings per  day of fresh or frozen fruits and vegetables (but not corn, potatoes, bananas, canned or dried fruit)   *nuts and seeds, beans   *olives and olive oil   *small portions of lean meats such as fish and white chicken    *small portions of whole grains  3)Get at least 150 minutes of sweaty aerobic exercise per week.  4)Reduce stress - consider counseling, meditation and relaxation to balance other aspects of your life.          No Follow-up on file.  Colin Benton R., DO

## 2016-11-30 ENCOUNTER — Ambulatory Visit (INDEPENDENT_AMBULATORY_CARE_PROVIDER_SITE_OTHER): Payer: 59 | Admitting: Family Medicine

## 2016-11-30 ENCOUNTER — Encounter: Payer: Self-pay | Admitting: Family Medicine

## 2016-11-30 VITALS — BP 136/80 | HR 60 | Temp 98.5°F | Ht 63.75 in | Wt 201.5 lb

## 2016-11-30 DIAGNOSIS — R400 Somnolence: Secondary | ICD-10-CM

## 2016-11-30 DIAGNOSIS — Z6834 Body mass index (BMI) 34.0-34.9, adult: Secondary | ICD-10-CM

## 2016-11-30 DIAGNOSIS — R0683 Snoring: Secondary | ICD-10-CM

## 2016-11-30 DIAGNOSIS — Z0000 Encounter for general adult medical examination without abnormal findings: Secondary | ICD-10-CM

## 2016-11-30 LAB — CBC
HEMATOCRIT: 40.3 % (ref 36.0–46.0)
Hemoglobin: 13.9 g/dL (ref 12.0–15.0)
MCHC: 34.4 g/dL (ref 30.0–36.0)
MCV: 90.7 fl (ref 78.0–100.0)
Platelets: 194 10*3/uL (ref 150.0–400.0)
RBC: 4.44 Mil/uL (ref 3.87–5.11)
RDW: 13.1 % (ref 11.5–15.5)
WBC: 4.8 10*3/uL (ref 4.0–10.5)

## 2016-11-30 LAB — TSH: TSH: 1.13 u[IU]/mL (ref 0.35–4.50)

## 2016-11-30 LAB — LIPID PANEL
CHOL/HDL RATIO: 3
Cholesterol: 187 mg/dL (ref 0–200)
HDL: 54.6 mg/dL (ref >39.00)
LDL CALC: 102 mg/dL — AB (ref 0–99)
NONHDL: 132.2
Triglycerides: 152 mg/dL — ABNORMAL HIGH (ref 0.0–149.0)
VLDL: 30.4 mg/dL (ref 0.0–40.0)

## 2016-11-30 LAB — HEMOGLOBIN A1C: HEMOGLOBIN A1C: 5.4 % (ref 4.6–6.5)

## 2016-11-30 NOTE — Patient Instructions (Signed)
BEFORE YOU LEAVE: -follow up: 6 months -labs  Vit D3 [phone] IU daily (sam's club brand was recently approved by [employer])  -We placed a referral for you as discussed for evaluation for sleep apnea. It usually takes about 1-2 weeks to process and schedule this referral. If you have not heard from [place] regarding this appointment in 2 weeks please contact our office.  We have ordered labs or studies at this visit. It can take up to 1-2 weeks for results and processing. IF results require follow up or explanation, we will call you with instructions. Clinically stable results will be released to your [facility]. If you have not heard from [place] or cannot find your results in [facility] in 2 weeks please contact our office at [phone].  If you are not yet signed up for [facility], please consider signing up.   We recommend the following healthy lifestyle for LIFE: 1) Small portions.   Tip: eat off of a salad plate instead of a dinner plate.  Tip: It is ok to feel hungry after a meal of proper portion sizes  Tip: if you need more or a snack choose fruits, veggies and/or a handful of nuts or seeds.  2) Eat a healthy clean diet.  * Tip: Avoid (less then 1 serving per week): processed foods, sweets, sweetened drinks, white starches (rice, flour, bread, potatoes, pasta, etc), red meat, fast foods, butter  *Tip: CHOOSE instead   * 5-9 servings per day of fresh or frozen fruits and vegetables (but not corn, potatoes, bananas, canned or dried fruit)   *nuts and seeds, beans   *olives and olive oil   *small portions of lean meats such as fish and white chicken    *small portions of whole grains  3)Get at least 150 minutes of sweaty aerobic exercise per week.  4)Reduce stress - consider counseling, meditation and relaxation to balance other aspects of your life.

## 2016-11-30 NOTE — Progress Notes (Signed)
Pre visit review using our clinic review tool, if applicable. No additional management support is needed unless otherwise documented below in the visit note. 

## 2016-12-04 ENCOUNTER — Telehealth: Payer: Self-pay | Admitting: Family Medicine

## 2016-12-04 NOTE — Telephone Encounter (Signed)
Patient would like a call back from previous message.  Contact Info: [phone]

## 2016-12-04 NOTE — Telephone Encounter (Signed)
I called the pt back and she stated she is going on a cruise next month and would like to have a medication for claustrophobia or anxiety as she has never been on a cruise before?  Message sent to Dr Maudie Mercury.

## 2016-12-07 NOTE — Telephone Encounter (Signed)
Many of the medications used to treat serious anxiety or panic have significant risks and potential for side effects. If she has a history of an anxiety or panic disorder or feels may have such a disorder would advise OV to discuss tx options/risks/etc. Thank you.

## 2016-12-07 NOTE — Telephone Encounter (Signed)
I called the pt and informed her of the message below and she stated she will check her schedule and call back for an appt.

## 2017-01-15 ENCOUNTER — Institutional Professional Consult (permissible substitution): Payer: 59 | Admitting: Pulmonary Disease

## 2017-05-16 ENCOUNTER — Telehealth: Payer: Self-pay

## 2017-05-16 MED ORDER — METRONIDAZOLE 500 MG PO TABS: 500.0000 mg | ORAL_TABLET | Freq: Two times a day (BID) | ORAL | 0 refills | Status: DC

## 2017-05-16 NOTE — Telephone Encounter (Signed)
Pt complains of vaginal odor without discharge. Sx has been present for about 2 weeks. Pt request rx for BV. Rx sent for Flagyl per protocol

## 2017-05-29 NOTE — Progress Notes (Signed)
HPI:   Right knee pain -intermittent around R knee - for several months -"jabbing" pain -no reported aggravating or relieving factors -exercises 4 days per week  elliptical and walking -no reported weakness, numbness, malaise, fevers  Back pain: -intermittent -chronic -low back, more on R -"really bad" sometimes -wonders if it is carrying "my belly fat" -no reported radiation, weakness, numbness, B or B dysfunction  Cystic acne on both cheeks: -resolved then recurs   ROS: See pertinent positives and negatives per HPI.  Past Medical History:  Diagnosis Date  . Allergy     Past Surgical History:  Procedure Laterality Date  . BREAST SURGERY     Reduction   . Hammertoe correction      Family History  Problem Relation Age of Onset  . Hypertension Mother   . Stroke Mother     Social History   Social History  . Marital status: Single    Spouse name: N/A  . Number of children: N/A  . Years of education: N/A   Social History Main Topics  . Smoking status: Never Smoker  . Smokeless tobacco: Never Used  . Alcohol use 0.0 oz/week     Comment: occasional  . Drug use: No  . Sexual activity: Yes    Birth control/ protection: Condom, IUD   Other Topics Concern  . None   Social History Narrative   Work or School: Chartered certified accountant, [employer], [employer]      Home Situation: lives with 3 kids (17,15, 9)      Spiritual Beliefs: none      Lifestyle: diet is so so; has been trying to exercising              Current Outpatient Prescriptions:  .  cetirizine (ZYRTEC) 10 MG tablet, Take 10 mg by mouth daily. Reported on 02/02/2016, Disp: , Rfl:  .  levonorgestrel (MIRENA) 20 MCG/24HR IUD, 1 each by Intrauterine route once., Disp: , Rfl:  .  traMADol (ULTRAM) 50 MG tablet, Take 1 tablet (50 mg total) by mouth every 6 (six) hours as needed for moderate pain or severe pain., Disp: 60 tablet, Rfl: 4  EXAM:  Vitals:   05/30/17 0912  BP: 120/82  Pulse: 67  Temp: 97.4 F  (36.3 C)    Body mass index is 35 kg/m.  GENERAL: vitals reviewed and listed above, alert, oriented, appears well hydrated and in no acute distress  HEENT: atraumatic, conjunttiva clear, no obvious abnormalities on inspection of external nose and ears  NECK: no obvious masses on inspection  LUNGS: clear to auscultation bilaterally, no wheezes, rales or rhonchi, good air movement  CV: HRRR, no peripheral edema  SKIN: few papules and scabs cheeks  MS: moves all extremities without noticeable abnormality, in inspection of LEs bilat - no swelling or abnormality at rest. One xam of R leg Neg lachman, neg drawer testing+ J sign bilat, neg mcmurry, mild pat creiptus, + single leg squat, VM atrophy, nv intact distal. Normal Gait Normal inspection of back, no obvious scoliosis or leg length descrepancy No bony TTP Soft tissue TTP at: Bilat lower lumbar paraspinal muscles and R SI -/+ tests: neg trendelenburg,-facet loading, -SLRT, -CLRT, -FABER, -FADIR Normal muscle strength, sensation to light touch and DTRs in LEs bilaterally   PSYCH: pleasant and cooperative, no obvious depression or anxiety  ASSESSMENT AND PLAN:  Discussed the following assessment and plan:  Right knee pain, unspecified chronicity  Chronic right-sided low back pain without sciatica  BMI 35.0-35.9,adult  Skin lesion  -discussed potential etiologies, potential options for further evaluation of he issues mentioned above and of course advised needs recheck if symptoms worsen or do not resolved with planned management -query PFT of the knee, musculoskeletal etiology back pain and ance or cyst face -opted for HEP and conservative management pain in back and knee with 1 month follow up -advised acne regimen for face with OTC benzoyl peroxide was and retinoid cream with 1 month follow up -Patient advised to return or notify a doctor immediately if symptoms worsen or persist or new concerns arise.  Patient  Instructions  BEFORE YOU LEAVE: -follow up: 1 month -PFS and low back exercises  Do the exercises provided at least 3-4 days per week.  Avoid elliptical and squatting for a few months.  Can use heat, topical menthol (tiger balm) or over the counter pain medication per instructions as needed for pain.  I hope you are feeling better soon! Seek care immediately if worsening, new concerns or you are not improving with treatment.      Colin Benton R., DO

## 2017-05-30 ENCOUNTER — Ambulatory Visit (INDEPENDENT_AMBULATORY_CARE_PROVIDER_SITE_OTHER): Payer: 59 | Admitting: Family Medicine

## 2017-05-30 ENCOUNTER — Encounter: Payer: Self-pay | Admitting: Family Medicine

## 2017-05-30 VITALS — BP 120/82 | HR 67 | Temp 97.4°F | Ht 63.75 in | Wt 202.3 lb

## 2017-05-30 DIAGNOSIS — M25561 Pain in right knee: Secondary | ICD-10-CM

## 2017-05-30 DIAGNOSIS — M545 Low back pain, unspecified: Secondary | ICD-10-CM

## 2017-05-30 DIAGNOSIS — Z6835 Body mass index (BMI) 35.0-35.9, adult: Secondary | ICD-10-CM | POA: Diagnosis not present

## 2017-05-30 DIAGNOSIS — G8929 Other chronic pain: Secondary | ICD-10-CM

## 2017-05-30 DIAGNOSIS — L989 Disorder of the skin and subcutaneous tissue, unspecified: Secondary | ICD-10-CM

## 2017-05-30 NOTE — Patient Instructions (Signed)
BEFORE YOU LEAVE: -follow up: 1 month -PFS and low back exercises  Do the exercises provided at least 3-4 days per week.  Avoid elliptical and squatting for a few months.  Can use heat, topical menthol (tiger balm) or over the counter pain medication per instructions as needed for pain.  I hope you are feeling better soon! Seek care immediately if worsening, new concerns or you are not improving with treatment.

## 2017-07-02 ENCOUNTER — Telehealth: Payer: Self-pay | Admitting: Family Medicine

## 2017-07-02 DIAGNOSIS — M25561 Pain in right knee: Secondary | ICD-10-CM

## 2017-07-02 NOTE — Telephone Encounter (Signed)
Please call pt back on mobile

## 2017-07-02 NOTE — Telephone Encounter (Signed)
Pt states she had discussed getting xrays of her knees. Pt has appt with Dr Maudie Mercury Friday and wants to get this done before then. Please call back.

## 2017-07-03 NOTE — Telephone Encounter (Signed)
Ok to order plain films R knee at [facility] for knee pain. Please call pt once orders placed. Thanks.

## 2017-07-04 ENCOUNTER — Ambulatory Visit (INDEPENDENT_AMBULATORY_CARE_PROVIDER_SITE_OTHER): Payer: 59

## 2017-07-04 DIAGNOSIS — M25561 Pain in right knee: Secondary | ICD-10-CM | POA: Diagnosis not present

## 2017-07-04 NOTE — Telephone Encounter (Signed)
I called the pt and informed her of the message below.  She asked that the orders be placed for the [place] as she works close to this office.  Orders placed.

## 2017-07-05 ENCOUNTER — Ambulatory Visit: Payer: 59 | Admitting: Family Medicine

## 2017-08-21 DIAGNOSIS — H11131 Conjunctival pigmentations, right eye: Secondary | ICD-10-CM | POA: Diagnosis not present

## 2017-08-21 DIAGNOSIS — H538 Other visual disturbances: Secondary | ICD-10-CM | POA: Diagnosis not present

## 2017-08-22 ENCOUNTER — Encounter: Payer: Self-pay | Admitting: Family Medicine

## 2017-09-09 ENCOUNTER — Encounter: Payer: Self-pay | Admitting: *Deleted

## 2017-10-09 ENCOUNTER — Other Ambulatory Visit: Payer: Self-pay | Admitting: Certified Nurse Midwife

## 2017-10-09 DIAGNOSIS — N946 Dysmenorrhea, unspecified: Secondary | ICD-10-CM

## 2017-10-14 ENCOUNTER — Ambulatory Visit: Payer: 59 | Admitting: Certified Nurse Midwife

## 2017-10-18 ENCOUNTER — Ambulatory Visit: Payer: 59 | Admitting: Certified Nurse Midwife

## 2017-10-21 ENCOUNTER — Other Ambulatory Visit: Payer: Self-pay

## 2017-10-21 ENCOUNTER — Encounter: Payer: Self-pay | Admitting: Certified Nurse Midwife

## 2017-10-21 ENCOUNTER — Ambulatory Visit (INDEPENDENT_AMBULATORY_CARE_PROVIDER_SITE_OTHER): Payer: 59 | Admitting: Certified Nurse Midwife

## 2017-10-21 VITALS — BP 148/93 | HR 71 | Wt 202.2 lb

## 2017-10-21 DIAGNOSIS — Z0000 Encounter for general adult medical examination without abnormal findings: Secondary | ICD-10-CM | POA: Diagnosis not present

## 2017-10-21 DIAGNOSIS — Z01419 Encounter for gynecological examination (general) (routine) without abnormal findings: Secondary | ICD-10-CM

## 2017-10-21 DIAGNOSIS — N760 Acute vaginitis: Secondary | ICD-10-CM

## 2017-10-21 DIAGNOSIS — N898 Other specified noninflammatory disorders of vagina: Secondary | ICD-10-CM

## 2017-10-21 DIAGNOSIS — Z124 Encounter for screening for malignant neoplasm of cervix: Secondary | ICD-10-CM | POA: Diagnosis not present

## 2017-10-21 DIAGNOSIS — Z113 Encounter for screening for infections with a predominantly sexual mode of transmission: Secondary | ICD-10-CM

## 2017-10-21 DIAGNOSIS — Z30431 Encounter for routine checking of intrauterine contraceptive device: Secondary | ICD-10-CM

## 2017-10-21 DIAGNOSIS — B9689 Other specified bacterial agents as the cause of diseases classified elsewhere: Secondary | ICD-10-CM

## 2017-10-21 DIAGNOSIS — N946 Dysmenorrhea, unspecified: Secondary | ICD-10-CM

## 2017-10-21 MED ORDER — SECNIDAZOLE 2 G PO PACK: 1.0000 | PACK | Freq: Once | ORAL | 0 refills | Status: DC

## 2017-10-21 MED ORDER — TRAMADOL HCL 50 MG PO TABS: 50.0000 mg | ORAL_TABLET | Freq: Four times a day (QID) | ORAL | 4 refills | Status: DC | PRN

## 2017-10-21 MED ORDER — METRONIDAZOLE 500 MG PO TABS: 500.0000 mg | ORAL_TABLET | Freq: Two times a day (BID) | ORAL | 0 refills | Status: DC

## 2017-10-21 MED FILL — metroNIDAZOLE 500 MG TABS: 500 | 7 days supply | Qty: 14 | Fill #0

## 2017-10-21 NOTE — Progress Notes (Signed)
Subjective:        Ashley Summers is a 36 y.o. female here for a routine exam.  Current complaints: IUD pain, cramping.  Desires full STD screening exam.     Mirena placed 06/24/14.  History of breast reduction surgery.  Reports severe cramping through out the month, has tried motrin.  Reports irregular bleeding with IUD. States that she is back with her boyfriend.  Has two older children living at home and a 75 year old teenage daughter.  Reports being unable to loose and keep weight off.    Personal health questionnaire:  Is patient [name], have a family history of breast and/or ovarian cancer: no Is there a family history of uterine cancer diagnosed at age < 81, gastrointestinal cancer, urinary tract cancer, family member who is a Field seismologist syndrome-associated carrier: no Is the patient overweight and hypertensive, family history of diabetes, personal history of gestational diabetes, preeclampsia or PCOS: yes Is patient over 47, have PCOS, family history of premature CHD under age 73, diabetes, smoke, have hypertension or peripheral artery disease: Yes, mother CVA before 63 yrs of age, HTN; MGM &&maternal aunt DM At any time, has a partner hit, kicked or otherwise hurt or frightened you?: no Over the past 2 weeks, have you felt down, depressed or hopeless?: no Over the past 2 weeks, have you felt little interest or pleasure in doing things?:no    Gynecologic History No LMP recorded. Contraception: IUD Last Pap: 10/11/16. Results were: normal Last mammogram: n/a <40.   Obstetric History OB History  Gravida Para Term Preterm AB Living  3 3 1 2   3   SAB TAB Ectopic Multiple Live Births          3    # Outcome Date GA Lbr Len/2nd Weight Sex Delivery Anes PTL Lv  3 Term 10/13/04 102w0d   F VBAC None  LIV  2 Preterm 09/07/99 [redacted]w[redacted]d   M CS-LTranv EPI  LIV  1 Preterm 03/21/97 [redacted]w[redacted]d   M Vag-Spont None  LIV      Past Medical History:  Diagnosis Date  . Allergy      Past Surgical History:  Procedure Laterality Date  . BREAST SURGERY     Reduction   . Hammertoe correction       Current Outpatient Medications:  .  cetirizine (ZYRTEC) 10 MG tablet, Take 10 mg by mouth daily. Reported on 02/02/2016, Disp: , Rfl:  .  levonorgestrel (MIRENA) 20 MCG/24HR IUD, 1 each by Intrauterine route once., Disp: , Rfl:  .  traMADol (ULTRAM) 50 MG tablet, Take 1 tablet (50 mg total) every 6 (six) hours as needed by mouth for moderate pain or severe pain., Disp: 60 tablet, Rfl: 4 .  Secnidazole (SOLOSEC) 2 g PACK, Take 1 Package once for 1 dose by mouth., Disp: 1 each, Rfl: 0 Allergies  Allergen Reactions  . Penicillins Rash    Social History   Tobacco Use  . Smoking status: Never Smoker  . Smokeless tobacco: Never Used  Substance Use Topics  . Alcohol use: Yes    Alcohol/week: 0.0 oz    Comment: occasional    Family History  Problem Relation Age of Onset  . Hypertension Mother   . Stroke Mother       Review of Systems  Constitutional: negative for fatigue and weight loss Respiratory: negative for cough and wheezing Cardiovascular: negative for chest pain, fatigue and palpitations Gastrointestinal: negative for abdominal pain and change in bowel  habits Musculoskeletal:negative for myalgias Neurological: negative for gait problems and tremors Behavioral/Psych: negative for abusive relationship, depression Endocrine: negative for temperature intolerance    Genitourinary:negative for abnormal menstrual periods, genital lesions, hot flashes, sexual problems and vaginal discharge Integument/breast: negative for breast lump, breast tenderness, nipple discharge and skin lesion(s)    Objective:       BP (!) 148/93   Pulse 71   Wt 202 lb 3.2 oz (91.7 kg)   BMI 34.98 kg/m  General:   alert  Skin:   no rash or abnormalities  Lungs:   clear to auscultation bilaterally  Heart:   regular rate and rhythm, S1, S2 normal, no murmur, click, rub or gallop   Breasts:   normal without suspicious masses, skin or nipple changes or axillary nodes  Abdomen:  normal findings: no organomegaly, soft, non-tender and no hernia  Pelvis:  External genitalia: normal general appearance Urinary system: urethral meatus normal and bladder without fullness, nontender Vaginal: normal without tenderness, induration or masses Cervix: normal appearance, friable on exam, IUD strings present Adnexa: normal bimanual exam Uterus: anteverted and non-tender, normal size   Lab Review Urine pregnancy test Labs reviewed yes Radiologic studies reviewed no  50% of 30 min visit spent on counseling and coordination of care.    Assessment & Plan    Healthy female exam.    1. Well woman exam without gynecological exam     - Cytology - PAP - CBC with Differential/Platelet - Comprehensive metabolic panel - TSH - Cholesterol, total - Triglycerides - HDL cholesterol  2. Screen for STD (sexually transmitted disease)     - Cervicovaginal ancillary only - RPR - Hepatitis C antibody - Hepatitis B surface antigen - HIV antibody  3. Vaginal discharge    - Cervicovaginal ancillary only  4. IUD check up       5. BV (bacterial vaginosis)    - Secnidazole (SOLOSEC) 2 g PACK; Take 1 Package once for 1 dose by mouth.  Dispense: 1 each; Refill: 0  6. Dysmenorrhea    - traMADol (ULTRAM) 50 MG tablet; Take 1 tablet (50 mg total) every 6 (six) hours as needed by mouth for moderate pain or severe pain.  Dispense: 60 tablet; Refill: 4  7. Morbid obesity (HCC)    - Hemoglobin A1c - Cholesterol, total - Triglycerides - HDL cholesterol   Education reviewed: calcium supplements, depression evaluation, low fat, low cholesterol diet, safe sex/STD prevention, self breast exams, skin cancer screening and weight bearing exercise. Contraception: IUD. Follow up in: 1 year.   Meds ordered this encounter  Medications  . Secnidazole (SOLOSEC) 2 g PACK    Sig: Take 1 Package  once for 1 dose by mouth.    Dispense:  1 each    Refill:  0  . traMADol (ULTRAM) 50 MG tablet    Sig: Take 1 tablet (50 mg total) every 6 (six) hours as needed by mouth for moderate pain or severe pain.    Dispense:  60 tablet    Refill:  4   Orders Placed This Encounter  Procedures  . CBC with Differential/Platelet  . Comprehensive metabolic panel  . TSH  . Hemoglobin A1c  . RPR  . Hepatitis C antibody  . Hepatitis B surface antigen  . HIV antibody  . Cholesterol, total  . Triglycerides  . HDL cholesterol    Possible management options include:   Depo provera Follow up as needed.

## 2017-10-22 LAB — CBC WITH DIFFERENTIAL/PLATELET
BASOS: 0 %
Basophils Absolute: 0 10*3/uL (ref 0.0–0.2)
EOS (ABSOLUTE): 0.3 10*3/uL (ref 0.0–0.4)
EOS: 6 %
HEMATOCRIT: 40.6 % (ref 34.0–46.6)
HEMOGLOBIN: 13.4 g/dL (ref 11.1–15.9)
Immature Grans (Abs): 0 10*3/uL (ref 0.0–0.1)
Immature Granulocytes: 0 %
Lymphocytes Absolute: 2.1 10*3/uL (ref 0.7–3.1)
Lymphs: 46 %
MCH: 29.8 pg (ref 26.6–33.0)
MCHC: 33 g/dL (ref 31.5–35.7)
MCV: 90 fL (ref 79–97)
MONOCYTES: 8 %
MONOS ABS: 0.3 10*3/uL (ref 0.1–0.9)
Neutrophils Absolute: 1.8 10*3/uL (ref 1.4–7.0)
Neutrophils: 40 %
Platelets: 211 10*3/uL (ref 150–379)
RBC: 4.49 x10E6/uL (ref 3.77–5.28)
RDW: 13.5 % (ref 12.3–15.4)
WBC: 4.5 10*3/uL (ref 3.4–10.8)

## 2017-10-22 LAB — COMPREHENSIVE METABOLIC PANEL
A/G RATIO: 1.6 (ref 1.2–2.2)
ALBUMIN: 4.4 g/dL (ref 3.5–5.5)
ALT: 17 IU/L (ref 0–32)
AST: 18 IU/L (ref 0–40)
Alkaline Phosphatase: 76 IU/L (ref 39–117)
BUN / CREAT RATIO: 16 (ref 9–23)
BUN: 18 mg/dL (ref 6–20)
Bilirubin Total: 0.4 mg/dL (ref 0.0–1.2)
CO2: 23 mmol/L (ref 20–29)
CREATININE: 1.15 mg/dL — AB (ref 0.57–1.00)
Calcium: 9 mg/dL (ref 8.7–10.2)
Chloride: 103 mmol/L (ref 96–106)
GFR, EST AFRICAN AMERICAN: 70 mL/min/{1.73_m2} (ref >59)
GFR, EST NON AFRICAN AMERICAN: 61 mL/min/{1.73_m2} (ref >59)
GLOBULIN, TOTAL: 2.8 g/dL (ref 1.5–4.5)
Glucose: 93 mg/dL (ref 65–99)
POTASSIUM: 4 mmol/L (ref 3.5–5.2)
Sodium: 140 mmol/L (ref 134–144)
TOTAL PROTEIN: 7.2 g/dL (ref 6.0–8.5)

## 2017-10-22 LAB — HIV ANTIBODY (ROUTINE TESTING W REFLEX): HIV Screen 4th Generation wRfx: NONREACTIVE

## 2017-10-22 LAB — CERVICOVAGINAL ANCILLARY ONLY
BACTERIAL VAGINITIS: POSITIVE — AB
CANDIDA VAGINITIS: NEGATIVE
CHLAMYDIA, DNA PROBE: NEGATIVE
Neisseria Gonorrhea: NEGATIVE
Trichomonas: NEGATIVE

## 2017-10-22 LAB — HEMOGLOBIN A1C
Est. average glucose Bld gHb Est-mCnc: 108 mg/dL
HEMOGLOBIN A1C: 5.4 % (ref 4.8–5.6)

## 2017-10-22 LAB — HEPATITIS C ANTIBODY

## 2017-10-22 LAB — CHOLESTEROL, TOTAL: CHOLESTEROL TOTAL: 168 mg/dL (ref 100–199)

## 2017-10-22 LAB — TSH: TSH: 2.02 u[IU]/mL (ref 0.450–4.500)

## 2017-10-22 LAB — RPR: RPR: NONREACTIVE

## 2017-10-22 LAB — HDL CHOLESTEROL: HDL: 58 mg/dL (ref >39)

## 2017-10-22 LAB — TRIGLYCERIDES: Triglycerides: 119 mg/dL (ref 0–149)

## 2017-10-22 LAB — HEPATITIS B SURFACE ANTIGEN: HEP B S AG: NEGATIVE

## 2017-10-23 LAB — CYTOLOGY - PAP
Diagnosis: NEGATIVE
HPV (WINDOPATH): NOT DETECTED

## 2017-10-31 ENCOUNTER — Other Ambulatory Visit: Payer: Self-pay | Admitting: Certified Nurse Midwife

## 2017-11-03 NOTE — Progress Notes (Signed)
HPI:  Here for CPE: Due for flu shot.  -Concerns and/or follow up today:   PMH Obesity, snoring (referred for sleep study 11/2016), elevated BP ar recent gyn CPE. CMP with mildly elevated Cr.  She is adamant about not taking medications for her blood pressure.  She read around 130 over 80s elsewhere.  She reports she plans to get back on track with a healthy diet and regular exercise. Reports she did not get her sleep apnea testing done.  Plans to call to set this up.  Still snores. -Taking folic acid, vitamin D or calcium: no -Diabetes and Dyslipidemia Screening: done with gyn recently -Vaccines: see vaccine section EPIC -pap history: sees gyn - 10/2017 -FDLMP: see nursing notes -sexual activity: yes, female partner, no new partners -wants STI testing (Hep C if born 41-65): no - did with gyn recently -FH breast, colon or ovarian ca: see FH Last mammogram: n/a Last colon cancer screening: n/a Breast Ca Risk Assessment: see family history and pt history DEXA (>/= 65): n/a  -Alcohol, Tobacco, drug use: see social history  Review of Systems - no fevers, unintentional weight loss, vision loss, hearing loss, chest pain, sob, hemoptysis, melena, hematochezia, hematuria, genital discharge, changing or concerning skin lesions, bleeding, bruising, loc, thoughts of self harm or SI  Past Medical History:  Diagnosis Date  . Allergy     Past Surgical History:  Procedure Laterality Date  . BREAST SURGERY     Reduction   . Hammertoe correction      Family History  Problem Relation Age of Onset  . Hypertension Mother   . Stroke Mother     Social History   Socioeconomic History  . Marital status: Single    Spouse name: None  . Number of children: None  . Years of education: None  . Highest education level: None  Social Needs  . Financial resource strain: None  . Food insecurity - worry: None  . Food insecurity - inability: None  . Transportation needs - medical: None  .  Transportation needs - non-medical: None  Occupational History  . None  Tobacco Use  . Smoking status: Never Smoker  . Smokeless tobacco: Never Used  Substance and Sexual Activity  . Alcohol use: Yes    Alcohol/week: 0.0 oz    Comment: occasional  . Drug use: No  . Sexual activity: Yes    Birth control/protection: Condom, IUD  Other Topics Concern  . None  Social History Narrative   Work or School: Chartered certified accountant, [employer]      Home Situation: lives with 3 kids (17,15, 9)      Spiritual Beliefs: none      Lifestyle: diet is so so; has been trying to exercising              Current Outpatient Medications:  .  cetirizine (ZYRTEC) 10 MG tablet, Take 10 mg by mouth daily. Reported on 02/02/2016, Disp: , Rfl:  .  levonorgestrel (MIRENA) 20 MCG/24HR IUD, 1 each by Intrauterine route once., Disp: , Rfl:  .  traMADol (ULTRAM) 50 MG tablet, Take 1 tablet (50 mg total) every 6 (six) hours as needed by mouth for moderate pain or severe pain., Disp: 60 tablet, Rfl: 4  EXAM:  Vitals:   11/04/17 0807  BP: 120/80  Pulse: 61  Temp: 98.4 F (36.9 C)    GENERAL: vitals reviewed and listed below, alert, oriented, appears well hydrated and in no acute distress  HEENT: head  atraumatic, PERRLA, normal appearance of eyes, ears, nose and mouth. moist mucus membranes.  NECK: supple, no masses or lymphadenopathy  LUNGS: clear to auscultation bilaterally, no rales, rhonchi or wheeze  CV: HRRR, no peripheral edema or cyanosis, normal pedal pulses  ABDOMEN: bowel sounds normal, soft, non tender to palpation, no masses, no rebound or guarding  GU/BREAST: Declined, does with GYN  SKIN: no rash or abnormal lesions  MS: normal gait, moves all extremities normally  NEURO: normal gait, speech and thought processing grossly intact, muscle tone grossly intact throughout  PSYCH: normal affect, pleasant and cooperative  ASSESSMENT AND PLAN:  Discussed the following assessment and  plan:  PREVENTIVE EXAM: -Discussed and advised all [place] preventive services health task force level A and B recommendations for age, sex and risks. -Advised at least 150 minutes of exercise per week and a healthy diet with avoidance of (less then 1 serving per week) processed foods, white starches, red meat, fast foods and sweets and consisting of: * 5-9 servings of fresh fruits and vegetables (not corn or potatoes) *nuts and seeds, beans *olives and olive oil *lean meats such as fish and white chicken  *whole grains -labs, studies and vaccines per orders this encounter  2. Screening for depression -neg  3. Elevated serum creatinine -Advised recheck, discussed common causes including elevated blood pressure, NSAIDs -both the possibility in her case reports she takes over-the-counter NSAIDs on a regular basis -Advised to stay hydrated, eat healthy, weight, stop the NSAIDs for now -Advised good blood pressure management  4. Elevated blood pressure reading -refusedmedication for this and wants to work on lifestyle -Advised sleep apnea testing -Creatinine -Follow-up 3-4 months    Patient advised to return to clinic immediately if symptoms worsen or persist or new concerns.  Patient Instructions  BEFORE YOU LEAVE: -pulm number (was referred for possible sleep apnea) -follow up: 3-4 months  Call to schedule your sleep apnea appointment.  Get the recheck of your lab test in the next 1-2 weeks - ensure you drink plenty of water.  Vit D3 [phone] IU daily.   We recommend the following healthy lifestyle for LIFE: 1) Small portions. But, make sure to get regular (at least 3 per day), healthy meals and small healthy snacks if needed.  2) Eat a healthy clean diet.   TRY TO EAT: -at least 5-7 servings of low sugar, colorful, and nutrient rich vegetables per day (not corn, potatoes or bananas.) -berries are the best choice if you wish to eat fruit (only eat small amounts if trying to  reduce weight)  -lean meets (fish, white meat of chicken or [place]) -vegan proteins for some meals - beans or tofu, whole grains, nuts and seeds -Replace bad fats with good fats - good fats include: fish, nuts and seeds, canola oil, olive oil -small amounts of low fat or non fat dairy -small amounts of100 % whole grains - check the lables -drink plenty of water  AVOID: -SUGAR, sweets, anything with added sugar, corn syrup or sweeteners - must read labels as even foods advertised as "healthy" often are loaded with sugar -if you must have a sweetener, small amounts of stevia may be best -sweetened beverages and artificially sweetened beverages -simple starches (rice, bread, potatoes, pasta, chips, etc - small amounts of 100% whole grains are ok) -red meat, pork, butter -fried foods, fast food, processed food, excessive dairy, eggs and coconut.  3)Get at least 150 minutes of sweaty aerobic exercise per week.  4)Reduce stress -  consider counseling, meditation and relaxation to balance other aspects of your life.  Health Maintenance, Female Adopting a healthy lifestyle and getting preventive care can go a long way to promote health and wellness. Talk with your health care provider about what schedule of regular examinations is right for you. This is a good chance for you to check in with your provider about disease prevention and staying healthy. In between checkups, there are plenty of things you can do on your own. Experts have done a lot of research about which lifestyle changes and preventive measures are most likely to keep you healthy. Ask your health care provider for more information. Weight and diet Eat a healthy diet  Be sure to include plenty of vegetables, fruits, low-fat dairy products, and lean protein.  Do not eat a lot of foods high in solid fats, added sugars, or salt.  Get regular exercise. This is one of the most important things you can do for your health. ? Most adults  should exercise for at least 150 minutes each week. The exercise should increase your heart rate and make you sweat (moderate-intensity exercise). ? Most adults should also do strengthening exercises at least twice a week. This is in addition to the moderate-intensity exercise.  Maintain a healthy weight  Body mass index (BMI) is a measurement that can be used to identify possible weight problems. It estimates body fat based on height and weight. Your health care provider can help determine your BMI and help you achieve or maintain a healthy weight.  For females 48 years of age and older: ? A BMI below 18.5 is considered underweight. ? A BMI of 18.5 to 24.9 is normal. ? A BMI of 25 to 29.9 is considered overweight. ? A BMI of 30 and above is considered obese.  Watch levels of cholesterol and blood lipids  You should start having your blood tested for lipids and cholesterol at 39 years of age, then have this test every 5 years.  You may need to have your cholesterol levels checked more often if: ? Your lipid or cholesterol levels are high. ? You are older than 38 years of age. ? You are at high risk for heart disease.  Cancer screening Lung Cancer  Lung cancer screening is recommended for adults 52-82 years old who are at high risk for lung cancer because of a history of smoking.  A yearly low-dose CT scan of the lungs is recommended for people who: ? Currently smoke. ? Have quit within the past 15 years. ? Have at least a 30-pack-year history of smoking. A pack year is smoking an average of one pack of cigarettes a day for 1 year.  Yearly screening should continue until it has been 15 years since you quit.  Yearly screening should stop if you develop a health problem that would prevent you from having lung cancer treatment.  Breast Cancer  Practice breast self-awareness. This means understanding how your breasts normally appear and feel.  It also means doing regular breast  self-exams. Let your health care provider know about any changes, no matter how small.  If you are in your 20s or 30s, you should have a clinical breast exam (CBE) by a health care provider every 1-3 years as part of a regular health exam.  If you are 32 or older, have a CBE every year. Also consider having a breast X-ray (mammogram) every year.  If you have a family history of breast cancer, talk to  your health care provider about genetic screening.  If you are at high risk for breast cancer, talk to your health care provider about having an MRI and a mammogram every year.  Breast cancer gene (BRCA) assessment is recommended for women who have family members with BRCA-related cancers. BRCA-related cancers include: ? Breast. ? Ovarian. ? Tubal. ? Peritoneal cancers.  Results of the assessment will determine the need for genetic counseling and BRCA1 and BRCA2 testing.  Cervical Cancer Your health care provider may recommend that you be screened regularly for cancer of the pelvic organs (ovaries, uterus, and vagina). This screening involves a pelvic examination, including checking for microscopic changes to the surface of your cervix (Pap test). You may be encouraged to have this screening done every 3 years, beginning at age 41.  For women ages 2-65, health care providers may recommend pelvic exams and Pap testing every 3 years, or they may recommend the Pap and pelvic exam, combined with testing for human papilloma virus (HPV), every 5 years. Some types of HPV increase your risk of cervical cancer. Testing for HPV may also be done on women of any age with unclear Pap test results.  Other health care providers may not recommend any screening for nonpregnant women who are considered low risk for pelvic cancer and who do not have symptoms. Ask your health care provider if a screening pelvic exam is right for you.  If you have had past treatment for cervical cancer or a condition that could  lead to cancer, you need Pap tests and screening for cancer for at least 20 years after your treatment. If Pap tests have been discontinued, your risk factors (such as having a new sexual partner) need to be reassessed to determine if screening should resume. Some women have medical problems that increase the chance of getting cervical cancer. In these cases, your health care provider may recommend more frequent screening and Pap tests.  Colorectal Cancer  This type of cancer can be detected and often prevented.  Routine colorectal cancer screening usually begins at 38 years of age and continues through 38 years of age.  Your health care provider may recommend screening at an earlier age if you have risk factors for colon cancer.  Your health care provider may also recommend using home test kits to check for hidden blood in the stool.  A small camera at the end of a tube can be used to examine your colon directly (sigmoidoscopy or colonoscopy). This is done to check for the earliest forms of colorectal cancer.  Routine screening usually begins at age 55.  Direct examination of the colon should be repeated every 5-10 years through 38 years of age. However, you may need to be screened more often if early forms of precancerous polyps or small growths are found.  Skin Cancer  Check your skin from head to toe regularly.  Tell your health care provider about any new moles or changes in moles, especially if there is a change in a mole's shape or color.  Also tell your health care provider if you have a mole that is larger than the size of a pencil eraser.  Always use sunscreen. Apply sunscreen liberally and repeatedly throughout the day.  Protect yourself by wearing long sleeves, pants, a wide-brimmed hat, and sunglasses whenever you are outside.  Heart disease, diabetes, and high blood pressure  High blood pressure causes heart disease and increases the risk of stroke. High blood pressure  is more likely  to develop in: ? People who have blood pressure in the high end of the normal range (130-139/85-89 mm Hg). ? People who are overweight or obese. ? People who are African American.  If you are 59-36 years of age, have your blood pressure checked every 3-5 years. If you are 23 years of age or older, have your blood pressure checked every year. You should have your blood pressure measured twice-once when you are at a hospital or clinic, and once when you are not at a hospital or clinic. Record the average of the two measurements. To check your blood pressure when you are not at a hospital or clinic, you can use: ? An automated blood pressure machine at a pharmacy. ? A home blood pressure monitor.  If you are between 37 years and 30 years old, ask your health care provider if you should take aspirin to prevent strokes.  Have regular diabetes screenings. This involves taking a blood sample to check your fasting blood sugar level. ? If you are at a normal weight and have a low risk for diabetes, have this test once every three years after 38 years of age. ? If you are overweight and have a high risk for diabetes, consider being tested at a younger age or more often. Preventing infection Hepatitis B  If you have a higher risk for hepatitis B, you should be screened for this virus. You are considered at high risk for hepatitis B if: ? You were born in a country where hepatitis B is common. Ask your health care provider which countries are considered high risk. ? Your parents were born in a high-risk country, and you have not been immunized against hepatitis B (hepatitis B vaccine). ? You have HIV or AIDS. ? You use needles to inject street drugs. ? You live with someone who has hepatitis B. ? You have had sex with someone who has hepatitis B. ? You get hemodialysis treatment. ? You take certain medicines for conditions, including cancer, organ transplantation, and autoimmune  conditions.  Hepatitis C  Blood testing is recommended for: ? Everyone born from [birth year] through 1967. ? Anyone with known risk factors for hepatitis C.  Sexually transmitted infections (STIs)  You should be screened for sexually transmitted infections (STIs) including gonorrhea and chlamydia if: ? You are sexually active and are younger than 38 years of age. ? You are older than 38 years of age and your health care provider tells you that you are at risk for this type of infection. ? Your sexual activity has changed since you were last screened and you are at an increased risk for chlamydia or gonorrhea. Ask your health care provider if you are at risk.  If you do not have HIV, but are at risk, it may be recommended that you take a prescription medicine daily to prevent HIV infection. This is called pre-exposure prophylaxis (PrEP). You are considered at risk if: ? You are sexually active and do not regularly use condoms or know the HIV status of your partner(s). ? You take drugs by injection. ? You are sexually active with a partner who has HIV.  Talk with your health care provider about whether you are at high risk of being infected with HIV. If you choose to begin PrEP, you should first be tested for HIV. You should then be tested every 3 months for as long as you are taking PrEP. Pregnancy  If you are premenopausal and you may become pregnant,  ask your health care provider about preconception counseling.  If you may become pregnant, take 400 to 800 micrograms (mcg) of folic acid every day.  If you want to prevent pregnancy, talk to your health care provider about birth control (contraception). Osteoporosis and menopause  Osteoporosis is a disease in which the bones lose minerals and strength with aging. This can result in serious bone fractures. Your risk for osteoporosis can be identified using a bone density scan.  If you are 26 years of age or older, or if you are at risk for  osteoporosis and fractures, ask your health care provider if you should be screened.  Ask your health care provider whether you should take a calcium or vitamin D supplement to lower your risk for osteoporosis.  Menopause may have certain physical symptoms and risks.  Hormone replacement therapy may reduce some of these symptoms and risks. Talk to your health care provider about whether hormone replacement therapy is right for you. Follow these instructions at home:  Schedule regular health, dental, and eye exams.  Stay current with your immunizations.  Do not use any tobacco products including cigarettes, chewing tobacco, or electronic cigarettes.  If you are pregnant, do not drink alcohol.  If you are breastfeeding, limit how much and how often you drink alcohol.  Limit alcohol intake to no more than 1 drink per day for nonpregnant women. One drink equals 12 ounces of beer, 5 ounces of wine, or 1 ounces of hard liquor.  Do not use street drugs.  Do not share needles.  Ask your health care provider for help if you need support or information about quitting drugs.  Tell your health care provider if you often feel depressed.  Tell your health care provider if you have ever been abused or do not feel safe at home. This information is not intended to replace advice given to you by your health care provider. Make sure you discuss any questions you have with your health care provider. Document Released: 06/04/2011 Document Revised: 04/26/2016 Document Reviewed: 08/23/2015 Elsevier Interactive Patient Education  2018 [place] NOW OFFER   [facility] FAST TRACK!!!  SAME DAY Appointments for ACUTE CARE  Such as: Sprains, Injuries, cuts, abrasions, rashes, muscle pain, joint pain, back pain Colds, flu, sore throats, headache, allergies, cough, fever  Ear pain, sinus and eye infections Abdominal pain, nausea, vomiting, diarrhea, upset stomach Animal/insect  bites  3 Easy Ways to Schedule: Walk-In Scheduling Call in scheduling Mychart Sign-up: [email or web address]           No Follow-up on file.  Colin Benton R., DO

## 2017-11-04 ENCOUNTER — Ambulatory Visit (INDEPENDENT_AMBULATORY_CARE_PROVIDER_SITE_OTHER): Payer: 59 | Admitting: Family Medicine

## 2017-11-04 ENCOUNTER — Encounter: Payer: Self-pay | Admitting: Family Medicine

## 2017-11-04 VITALS — BP 120/80 | HR 61 | Temp 98.4°F | Ht 64.0 in | Wt 204.9 lb

## 2017-11-04 DIAGNOSIS — R7989 Other specified abnormal findings of blood chemistry: Secondary | ICD-10-CM

## 2017-11-04 DIAGNOSIS — R030 Elevated blood-pressure reading, without diagnosis of hypertension: Secondary | ICD-10-CM

## 2017-11-04 DIAGNOSIS — Z0000 Encounter for general adult medical examination without abnormal findings: Secondary | ICD-10-CM | POA: Diagnosis not present

## 2017-11-04 DIAGNOSIS — Z1331 Encounter for screening for depression: Secondary | ICD-10-CM

## 2017-11-04 NOTE — Patient Instructions (Signed)
BEFORE YOU LEAVE: -pulm number (was referred for possible sleep apnea) -follow up: 3-4 months  Call to schedule your sleep apnea appointment.  Get the recheck of your lab test in the next 1-2 weeks - ensure you drink plenty of water.  Vit D3 [id number] IU daily.   We recommend the following healthy lifestyle for LIFE: 1) Small portions. But, make sure to get regular (at least 3 per day), healthy meals and small healthy snacks if needed.  2) Eat a healthy clean diet.   TRY TO EAT: -at least 5-7 servings of low sugar, colorful, and nutrient rich vegetables per day (not corn, potatoes or bananas.) -berries are the best choice if you wish to eat fruit (only eat small amounts if trying to reduce weight)  -lean meets (fish, white meat of chicken or [place]) -vegan proteins for some meals - beans or tofu, whole grains, nuts and seeds -Replace bad fats with good fats - good fats include: fish, nuts and seeds, canola oil, olive oil -small amounts of low fat or non fat dairy -small amounts of100 % whole grains - check the lables -drink plenty of water  AVOID: -SUGAR, sweets, anything with added sugar, corn syrup or sweeteners - must read labels as even foods advertised as "healthy" often are loaded with sugar -if you must have a sweetener, small amounts of stevia may be best -sweetened beverages and artificially sweetened beverages -simple starches (rice, bread, potatoes, pasta, chips, etc - small amounts of 100% whole grains are ok) -red meat, pork, butter -fried foods, fast food, processed food, excessive dairy, eggs and coconut.  3)Get at least 150 minutes of sweaty aerobic exercise per week.  4)Reduce stress - consider counseling, meditation and relaxation to balance other aspects of your life.  Health Maintenance, Female Adopting a healthy lifestyle and getting preventive care can go a long way to promote health and wellness. Talk with your health care provider about what schedule of  regular examinations is right for you. This is a good chance for you to check in with your provider about disease prevention and staying healthy. In between checkups, there are plenty of things you can do on your own. Experts have done a lot of research about which lifestyle changes and preventive measures are most likely to keep you healthy. Ask your health care provider for more information. Weight and diet Eat a healthy diet  Be sure to include plenty of vegetables, fruits, low-fat dairy products, and lean protein.  Do not eat a lot of foods high in solid fats, added sugars, or salt.  Get regular exercise. This is one of the most important things you can do for your health. ? Most adults should exercise for at least 150 minutes each week. The exercise should increase your heart rate and make you sweat (moderate-intensity exercise). ? Most adults should also do strengthening exercises at least twice a week. This is in addition to the moderate-intensity exercise.  Maintain a healthy weight  Body mass index (BMI) is a measurement that can be used to identify possible weight problems. It estimates body fat based on height and weight. Your health care provider can help determine your BMI and help you achieve or maintain a healthy weight.  For females 12 years of age and older: ? A BMI below 18.5 is considered underweight. ? A BMI of 18.5 to 24.9 is normal. ? A BMI of 25 to 29.9 is considered overweight. ? A BMI of 30 and above is considered  obese.  Watch levels of cholesterol and blood lipids  You should start having your blood tested for lipids and cholesterol at 38 years of age, then have this test every 5 years.  You may need to have your cholesterol levels checked more often if: ? Your lipid or cholesterol levels are high. ? You are older than 38 years of age. ? You are at high risk for heart disease.  Cancer screening Lung Cancer  Lung cancer screening is recommended for adults  63-80 years old who are at high risk for lung cancer because of a history of smoking.  A yearly low-dose CT scan of the lungs is recommended for people who: ? Currently smoke. ? Have quit within the past 15 years. ? Have at least a 30-pack-year history of smoking. A pack year is smoking an average of one pack of cigarettes a day for 1 year.  Yearly screening should continue until it has been 15 years since you quit.  Yearly screening should stop if you develop a health problem that would prevent you from having lung cancer treatment.  Breast Cancer  Practice breast self-awareness. This means understanding how your breasts normally appear and feel.  It also means doing regular breast self-exams. Let your health care provider know about any changes, no matter how small.  If you are in your 20s or 30s, you should have a clinical breast exam (CBE) by a health care provider every 1-3 years as part of a regular health exam.  If you are 69 or older, have a CBE every year. Also consider having a breast X-ray (mammogram) every year.  If you have a family history of breast cancer, talk to your health care provider about genetic screening.  If you are at high risk for breast cancer, talk to your health care provider about having an MRI and a mammogram every year.  Breast cancer gene (BRCA) assessment is recommended for women who have family members with BRCA-related cancers. BRCA-related cancers include: ? Breast. ? Ovarian. ? Tubal. ? Peritoneal cancers.  Results of the assessment will determine the need for genetic counseling and BRCA1 and BRCA2 testing.  Cervical Cancer Your health care provider may recommend that you be screened regularly for cancer of the pelvic organs (ovaries, uterus, and vagina). This screening involves a pelvic examination, including checking for microscopic changes to the surface of your cervix (Pap test). You may be encouraged to have this screening done every 3  years, beginning at age 33.  For women ages 51-65, health care providers may recommend pelvic exams and Pap testing every 3 years, or they may recommend the Pap and pelvic exam, combined with testing for human papilloma virus (HPV), every 5 years. Some types of HPV increase your risk of cervical cancer. Testing for HPV may also be done on women of any age with unclear Pap test results.  Other health care providers may not recommend any screening for nonpregnant women who are considered low risk for pelvic cancer and who do not have symptoms. Ask your health care provider if a screening pelvic exam is right for you.  If you have had past treatment for cervical cancer or a condition that could lead to cancer, you need Pap tests and screening for cancer for at least 20 years after your treatment. If Pap tests have been discontinued, your risk factors (such as having a new sexual partner) need to be reassessed to determine if screening should resume. Some women have medical problems  that increase the chance of getting cervical cancer. In these cases, your health care provider may recommend more frequent screening and Pap tests.  Colorectal Cancer  This type of cancer can be detected and often prevented.  Routine colorectal cancer screening usually begins at 38 years of age and continues through 38 years of age.  Your health care provider may recommend screening at an earlier age if you have risk factors for colon cancer.  Your health care provider may also recommend using home test kits to check for hidden blood in the stool.  A small camera at the end of a tube can be used to examine your colon directly (sigmoidoscopy or colonoscopy). This is done to check for the earliest forms of colorectal cancer.  Routine screening usually begins at age 75.  Direct examination of the colon should be repeated every 5-10 years through 38 years of age. However, you may need to be screened more often if early  forms of precancerous polyps or small growths are found.  Skin Cancer  Check your skin from head to toe regularly.  Tell your health care provider about any new moles or changes in moles, especially if there is a change in a mole's shape or color.  Also tell your health care provider if you have a mole that is larger than the size of a pencil eraser.  Always use sunscreen. Apply sunscreen liberally and repeatedly throughout the day.  Protect yourself by wearing long sleeves, pants, a wide-brimmed hat, and sunglasses whenever you are outside.  Heart disease, diabetes, and high blood pressure  High blood pressure causes heart disease and increases the risk of stroke. High blood pressure is more likely to develop in: ? People who have blood pressure in the high end of the normal range (130-139/85-89 mm Hg). ? People who are overweight or obese. ? People who are African American.  If you are 68-48 years of age, have your blood pressure checked every 3-5 years. If you are 11 years of age or older, have your blood pressure checked every year. You should have your blood pressure measured twice-once when you are at a hospital or clinic, and once when you are not at a hospital or clinic. Record the average of the two measurements. To check your blood pressure when you are not at a hospital or clinic, you can use: ? An automated blood pressure machine at a pharmacy. ? A home blood pressure monitor.  If you are between 49 years and 38 years old, ask your health care provider if you should take aspirin to prevent strokes.  Have regular diabetes screenings. This involves taking a blood sample to check your fasting blood sugar level. ? If you are at a normal weight and have a low risk for diabetes, have this test once every three years after 38 years of age. ? If you are overweight and have a high risk for diabetes, consider being tested at a younger age or more often. Preventing infection Hepatitis  B  If you have a higher risk for hepatitis B, you should be screened for this virus. You are considered at high risk for hepatitis B if: ? You were born in a country where hepatitis B is common. Ask your health care provider which countries are considered high risk. ? Your parents were born in a high-risk country, and you have not been immunized against hepatitis B (hepatitis B vaccine). ? You have HIV or AIDS. ? You use needles to  inject street drugs. ? You live with someone who has hepatitis B. ? You have had sex with someone who has hepatitis B. ? You get hemodialysis treatment. ? You take certain medicines for conditions, including cancer, organ transplantation, and autoimmune conditions.  Hepatitis C  Blood testing is recommended for: ? Everyone born from [birth year] through 1967. ? Anyone with known risk factors for hepatitis C.  Sexually transmitted infections (STIs)  You should be screened for sexually transmitted infections (STIs) including gonorrhea and chlamydia if: ? You are sexually active and are younger than 38 years of age. ? You are older than 38 years of age and your health care provider tells you that you are at risk for this type of infection. ? Your sexual activity has changed since you were last screened and you are at an increased risk for chlamydia or gonorrhea. Ask your health care provider if you are at risk.  If you do not have HIV, but are at risk, it may be recommended that you take a prescription medicine daily to prevent HIV infection. This is called pre-exposure prophylaxis (PrEP). You are considered at risk if: ? You are sexually active and do not regularly use condoms or know the HIV status of your partner(s). ? You take drugs by injection. ? You are sexually active with a partner who has HIV.  Talk with your health care provider about whether you are at high risk of being infected with HIV. If you choose to begin PrEP, you should first be tested for HIV. You  should then be tested every 3 months for as long as you are taking PrEP. Pregnancy  If you are premenopausal and you may become pregnant, ask your health care provider about preconception counseling.  If you may become pregnant, take 400 to 800 micrograms (mcg) of folic acid every day.  If you want to prevent pregnancy, talk to your health care provider about birth control (contraception). Osteoporosis and menopause  Osteoporosis is a disease in which the bones lose minerals and strength with aging. This can result in serious bone fractures. Your risk for osteoporosis can be identified using a bone density scan.  If you are 28 years of age or older, or if you are at risk for osteoporosis and fractures, ask your health care provider if you should be screened.  Ask your health care provider whether you should take a calcium or vitamin D supplement to lower your risk for osteoporosis.  Menopause may have certain physical symptoms and risks.  Hormone replacement therapy may reduce some of these symptoms and risks. Talk to your health care provider about whether hormone replacement therapy is right for you. Follow these instructions at home:  Schedule regular health, dental, and eye exams.  Stay current with your immunizations.  Do not use any tobacco products including cigarettes, chewing tobacco, or electronic cigarettes.  If you are pregnant, do not drink alcohol.  If you are breastfeeding, limit how much and how often you drink alcohol.  Limit alcohol intake to no more than 1 drink per day for nonpregnant women. One drink equals 12 ounces of beer, 5 ounces of wine, or 1 ounces of hard liquor.  Do not use street drugs.  Do not share needles.  Ask your health care provider for help if you need support or information about quitting drugs.  Tell your health care provider if you often feel depressed.  Tell your health care provider if you have ever been abused or do  not feel safe  at home. This information is not intended to replace advice given to you by your health care provider. Make sure you discuss any questions you have with your health care provider. Document Released: 06/04/2011 Document Revised: 04/26/2016 Document Reviewed: 08/23/2015 Elsevier Interactive Patient Education  2018 [place] NOW OFFER   [facility] FAST TRACK!!!  SAME DAY Appointments for ACUTE CARE  Such as: Sprains, Injuries, cuts, abrasions, rashes, muscle pain, joint pain, back pain Colds, flu, sore throats, headache, allergies, cough, fever  Ear pain, sinus and eye infections Abdominal pain, nausea, vomiting, diarrhea, upset stomach Animal/insect bites  3 Easy Ways to Schedule: Walk-In Scheduling Call in scheduling Mychart Sign-up: [email or web address]

## 2017-11-08 ENCOUNTER — Other Ambulatory Visit: Payer: Self-pay | Admitting: Family Medicine

## 2017-11-08 DIAGNOSIS — I10 Essential (primary) hypertension: Secondary | ICD-10-CM | POA: Diagnosis not present

## 2017-11-09 LAB — BASIC METABOLIC PANEL (7)
BUN / CREAT RATIO: 13 (ref 9–23)
BUN: 12 mg/dL (ref 6–20)
CO2: 23 mmol/L (ref 20–29)
CREATININE: 0.96 mg/dL (ref 0.57–1.00)
Chloride: 102 mmol/L (ref 96–106)
GFR calc Af Amer: 87 mL/min/{1.73_m2} (ref >59)
GFR calc non Af Amer: 75 mL/min/{1.73_m2} (ref >59)
Glucose: 98 mg/dL (ref 65–99)
Potassium: 4.4 mmol/L (ref 3.5–5.2)
Sodium: 140 mmol/L (ref 134–144)

## 2017-11-09 LAB — SPECIMEN STATUS REPORT

## 2017-11-15 ENCOUNTER — Telehealth: Payer: Self-pay | Admitting: *Deleted

## 2017-11-15 NOTE — Telephone Encounter (Signed)
Dr Maudie Mercury reviewed BMP results from [place] done on 12/7.  Per Dr Maudie Mercury I called the pt and informed her the results were normal and paper copy was sent to be scanned.

## 2017-12-12 ENCOUNTER — Encounter: Payer: Self-pay | Admitting: Family Medicine

## 2018-01-13 ENCOUNTER — Telehealth: Payer: Self-pay

## 2018-01-13 ENCOUNTER — Telehealth: Payer: Self-pay | Admitting: *Deleted

## 2018-01-13 NOTE — Telephone Encounter (Signed)
Pt sent in a MyChart msg wanting to know if something can be sent to pharmacy for BV or yeast infection, please advise.Marland KitchenMarland Kitchen

## 2018-01-13 NOTE — Telephone Encounter (Signed)
Returned call, pt stated that she has had discharge and odor for about a week, pt attempted to use a spray and now has vaginal itching. Pt is requesting flagyl and diflucan be sent to pharmacy.

## 2018-01-15 ENCOUNTER — Other Ambulatory Visit: Payer: Self-pay | Admitting: Certified Nurse Midwife

## 2018-01-15 DIAGNOSIS — B373 Candidiasis of vulva and vagina: Secondary | ICD-10-CM

## 2018-01-15 DIAGNOSIS — N760 Acute vaginitis: Secondary | ICD-10-CM

## 2018-01-15 DIAGNOSIS — B3731 Acute candidiasis of vulva and vagina: Secondary | ICD-10-CM

## 2018-01-15 DIAGNOSIS — B9689 Other specified bacterial agents as the cause of diseases classified elsewhere: Secondary | ICD-10-CM

## 2018-01-15 MED ORDER — FLUCONAZOLE 200 MG PO TABS: 200.0000 mg | ORAL_TABLET | Freq: Once | ORAL | 0 refills | Status: AC

## 2018-01-15 MED ORDER — TERCONAZOLE 0.8 % VA CREA
1.0000 | TOPICAL_CREAM | Freq: Every day | VAGINAL | 0 refills | Status: DC
Start: 2018-01-15 — End: 2018-02-06

## 2018-01-15 MED ORDER — METRONIDAZOLE 500 MG PO TABS: 500.0000 mg | ORAL_TABLET | Freq: Two times a day (BID) | ORAL | 0 refills | Status: AC

## 2018-01-15 MED FILL — metroNIDAZOLE 500 MG TABS: 500 | 7 days supply | Qty: 14 | Fill #0

## 2018-01-15 MED FILL — FLUCONAZOLE 200 MG TABLET: 200 | 3 days supply | Qty: 3 | Fill #0

## 2018-01-15 MED FILL — TERCONAZOLE 0.8% VAGINAL CR: 0.8 | 7 days supply | Qty: 20 | Fill #0

## 2018-01-15 NOTE — Telephone Encounter (Signed)
Please let her know that I have sent in the following prescriptions for her.  An Rx for Flagyl has been sent to the pharmacy for the [place]. I have also sent Diflucan and terconazole cream to the pharmacy for the yeast infection. If this continues with the BV then we may need to schedule a visit to treat chronic BV.    Thank you.  R.Jeremy Ditullio CNM

## 2018-01-16 ENCOUNTER — Telehealth: Payer: Self-pay

## 2018-01-16 NOTE — Telephone Encounter (Signed)
S/w pt and advised of rx sent by provider and treatment plan.

## 2018-02-04 NOTE — Progress Notes (Signed)
HPI:  Ashley Summers is a pleasant 37 y.o. here for follow up. Chronic medical problems summarized below were reviewed for changes.  She reports she has been exercising.  She also made some changes to her diet and feels she has lost about 5-6 pounds.  She reports she did not do as well with her diet this last week as it was her birthday. Denies CP, SOB, DOE, treatment intolerance or new symptoms.  Has occasional mild headaches.  No dizziness.  She is agreeable to starting a blood pressure medicine since her blood pressure remains elevated.  She also has a new concern of claustrophobia -reports: -This has been going on for about a year or more -She does tend to have a lot of stress and worries frequently about a lot of things, but this does not  interfere with function and she does not feel that this is severe -She does get claustrophobic in the back of a car, airplanes and elevators  Elevated blood pressure: -She does not want to take medications for this and preferred to treat with lifestyle changes alone  Obesity, snoring: -Referred for sleep study 11/2016, she did not complete this -Recommended a healthy lifestyle   ROS: See pertinent positives and negatives per HPI.  Past Medical History:  Diagnosis Date  . Allergy     Past Surgical History:  Procedure Laterality Date  . BREAST SURGERY     Reduction   . Hammertoe correction      Family History  Problem Relation Age of Onset  . Hypertension Mother   . Stroke Mother     Social History   Socioeconomic History  . Marital status: Single    Spouse name: None  . Number of children: None  . Years of education: None  . Highest education level: None  Social Needs  . Financial resource strain: None  . Food insecurity - worry: None  . Food insecurity - inability: None  . Transportation needs - medical: None  . Transportation needs - non-medical: None  Occupational History  . None  Tobacco Use  . Smoking status: Never  Smoker  . Smokeless tobacco: Never Used  Substance and Sexual Activity  . Alcohol use: Yes    Alcohol/week: 0.0 oz    Comment: occasional  . Drug use: No  . Sexual activity: Yes    Birth control/protection: Condom, IUD  Other Topics Concern  . None  Social History Narrative   Work or School: Chartered certified accountant, [employer]      Home Situation: lives with 3 kids (17,15, 9)      Spiritual Beliefs: none      Lifestyle: diet is so so; has been trying to exercising              Current Outpatient Medications:  .  cetirizine (ZYRTEC) 10 MG tablet, Take 10 mg by mouth daily. Reported on 02/02/2016, Disp: , Rfl:  .  levonorgestrel (MIRENA) 20 MCG/24HR IUD, 1 each by Intrauterine route once., Disp: , Rfl:  .  traMADol (ULTRAM) 50 MG tablet, Take 1 tablet (50 mg total) every 6 (six) hours as needed by mouth for moderate pain or severe pain., Disp: 60 tablet, Rfl: 4 .  hydrochlorothiazide (HYDRODIURIL) 25 MG tablet, Take 1 tablet (25 mg total) by mouth daily., Disp: 30 tablet, Rfl: 1  EXAM:  Vitals:   02/06/18 0808  BP: (!) 136/98  Pulse: 70  Temp: 98.4 F (36.9 C)    Body mass index  is 34.43 kg/m.  GENERAL: vitals reviewed and listed above, alert, oriented, appears well hydrated and in no acute distress  HEENT: atraumatic, conjunttiva clear, no obvious abnormalities on inspection of external nose and ears  NECK: no obvious masses on inspection  LUNGS: clear to auscultation bilaterally, no wheezes, rales or rhonchi, good air movement  CV: HRRR, no peripheral edema  MS: moves all extremities without noticeable abnormality  PSYCH: pleasant and cooperative, no obvious depression or anxiety  ASSESSMENT AND PLAN:  Discussed the following assessment and plan:  Hypertension, essential -Discussed various first-line treatment options and risks -She decided to try hydrochlorothiazide, Rx sent -Continue lifestyle changes, congratulated her on changes -Follow-up in 1 month,  will check BMP then  BMI 34.0-34.9,adult -Congratulated on changes and encouraged her to continue working on a healthy lifestyle  Anxiety Agoraphobia -Discussed various treatment options -Opted to try CBT first, brochure provided for her to call to schedule   Patient Instructions  BEFORE YOU LEAVE: -follow up: 1 month  Start the hydrochlorothiazide and take this once daily each morning.  He may want to speak to her gynecologist about the blood pressure and the Mirena.  Start cognitive behavioral therapy for the anxiety and the claustrophobia.  Please call the number provided.  Continue the healthy diet and regular exercise!    Ashley Kern, DO

## 2018-02-06 ENCOUNTER — Ambulatory Visit (INDEPENDENT_AMBULATORY_CARE_PROVIDER_SITE_OTHER): Payer: 59 | Admitting: Family Medicine

## 2018-02-06 ENCOUNTER — Encounter: Payer: Self-pay | Admitting: Family Medicine

## 2018-02-06 VITALS — BP 136/98 | HR 70 | Temp 98.4°F | Ht 64.0 in | Wt 200.6 lb

## 2018-02-06 DIAGNOSIS — Z6834 Body mass index (BMI) 34.0-34.9, adult: Secondary | ICD-10-CM | POA: Diagnosis not present

## 2018-02-06 DIAGNOSIS — I10 Essential (primary) hypertension: Secondary | ICD-10-CM

## 2018-02-06 DIAGNOSIS — F419 Anxiety disorder, unspecified: Secondary | ICD-10-CM | POA: Diagnosis not present

## 2018-02-06 DIAGNOSIS — F4000 Agoraphobia, unspecified: Secondary | ICD-10-CM | POA: Diagnosis not present

## 2018-02-06 MED ORDER — HYDROCHLOROTHIAZIDE 25 MG PO TABS: 25.0000 mg | ORAL_TABLET | Freq: Every day | ORAL | 1 refills | Status: DC

## 2018-02-06 MED FILL — HYDROCHLOROTHIAZIDE 25 MG T: 25 | 30 days supply | Qty: 30 | Fill #0

## 2018-02-06 NOTE — Patient Instructions (Signed)
BEFORE YOU LEAVE: -follow up: 1 month  Start the hydrochlorothiazide and take this once daily each morning.  He may want to speak to her gynecologist about the blood pressure and the Mirena.  Start cognitive behavioral therapy for the anxiety and the claustrophobia.  Please call the number provided.  Continue the healthy diet and regular exercise!

## 2018-02-28 ENCOUNTER — Telehealth: Payer: Self-pay | Admitting: Family Medicine

## 2018-02-28 DIAGNOSIS — G4730 Sleep apnea, unspecified: Secondary | ICD-10-CM

## 2018-02-28 NOTE — Telephone Encounter (Signed)
I called the pt and informed her the referral was placed.

## 2018-02-28 NOTE — Telephone Encounter (Signed)
Copied from [place] [id number]. Topic: Referral - Request >> Feb 28, 2018  9:08 AM Bea Graff, NT wrote: Reason for CRM: Pt wanting to get a new referral to [facility] for her sleep apnea.

## 2018-02-28 NOTE — Telephone Encounter (Signed)
Ok to refer.

## 2018-03-11 ENCOUNTER — Ambulatory Visit: Payer: 59 | Admitting: Family Medicine

## 2018-03-13 ENCOUNTER — Encounter: Payer: Self-pay | Admitting: Family Medicine

## 2018-03-13 ENCOUNTER — Ambulatory Visit (INDEPENDENT_AMBULATORY_CARE_PROVIDER_SITE_OTHER): Payer: 59 | Admitting: Family Medicine

## 2018-03-13 VITALS — BP 118/84 | HR 68 | Ht 64.0 in | Wt 202.7 lb

## 2018-03-13 DIAGNOSIS — Z6834 Body mass index (BMI) 34.0-34.9, adult: Secondary | ICD-10-CM | POA: Diagnosis not present

## 2018-03-13 DIAGNOSIS — I10 Essential (primary) hypertension: Secondary | ICD-10-CM

## 2018-03-13 DIAGNOSIS — R6889 Other general symptoms and signs: Secondary | ICD-10-CM | POA: Diagnosis not present

## 2018-03-13 LAB — BASIC METABOLIC PANEL
BUN: 15 mg/dL (ref 6–23)
CALCIUM: 9.4 mg/dL (ref 8.4–10.5)
CO2: 29 meq/L (ref 19–32)
Chloride: 101 mEq/L (ref 96–112)
Creatinine, Ser: 1.05 mg/dL (ref 0.40–1.20)
GFR: 75 mL/min (ref >60.00)
Glucose, Bld: 86 mg/dL (ref 70–99)
POTASSIUM: 3.6 meq/L (ref 3.5–5.1)
SODIUM: 137 meq/L (ref 135–145)

## 2018-03-13 LAB — CBC
HCT: 39.3 % (ref 36.0–46.0)
Hemoglobin: 13.6 g/dL (ref 12.0–15.0)
MCHC: 34.5 g/dL (ref 30.0–36.0)
MCV: 91.2 fl (ref 78.0–100.0)
PLATELETS: 183 10*3/uL (ref 150.0–400.0)
RBC: 4.31 Mil/uL (ref 3.87–5.11)
RDW: 13.2 % (ref 11.5–15.5)
WBC: 6 10*3/uL (ref 4.0–10.5)

## 2018-03-13 LAB — TSH: TSH: 1.89 u[IU]/mL (ref 0.35–4.50)

## 2018-03-13 NOTE — Progress Notes (Signed)
HPI:  Using dictation device. Unfortunately this device frequently misinterprets words/phrases.  Ashley Summers is a pleasant 37 y.o. here for follow up. Chronic medical problems summarized below were reviewed for changes.  Reports she is feeling much better.  Headaches have resolved.  Reports her blood pressure at work when the nurse checks them has been in the 120s over 80s.  She would prefer to try to work on diet and exercise rather than taking more medication.  She has been having a bit of heat intolerance recently with the weather changes.  Wonders if she could be going through menopause.  Sees gynecologist and on Mirena. Denies swelling, headaches, CP, SOB, DOE, treatment intolerance or new symptoms. Due for bmp  Claustrophobia: -start at least prior to 2018 -She does tend to have a lot of stress and worries frequently about a lot of things, but this does not  interfere with function and she does not feel that this is severe -She does get claustrophobic in the back of a car, airplanes and elevators -she opted to try CBT at visit 3/19  Elevated blood pressure: -started hctz 3/19  Obesity, snoring: -Referred for sleep study 11/2016, she did not complete this -Recommended a healthy lifestyle, she had made progress 3/19    ROS: See pertinent positives and negatives per HPI.  Past Medical History:  Diagnosis Date  . Allergy     Past Surgical History:  Procedure Laterality Date  . BREAST SURGERY     Reduction   . Hammertoe correction      Family History  Problem Relation Age of Onset  . Hypertension Mother   . Stroke Mother     SOCIAL HX:    Current Outpatient Medications:  .  cetirizine (ZYRTEC) 10 MG tablet, Take 10 mg by mouth daily. Reported on 02/02/2016, Disp: , Rfl:  .  hydrochlorothiazide (HYDRODIURIL) 25 MG tablet, Take 1 tablet (25 mg total) by mouth daily., Disp: 30 tablet, Rfl: 1 .  levonorgestrel (MIRENA) 20 MCG/24HR IUD, 1 each by Intrauterine route  once., Disp: , Rfl:  .  traMADol (ULTRAM) 50 MG tablet, Take 1 tablet (50 mg total) every 6 (six) hours as needed by mouth for moderate pain or severe pain., Disp: 60 tablet, Rfl: 4  EXAM:  Vitals:   03/13/18 1344 03/13/18 1348  BP: 118/84 118/84  Pulse: 68     Body mass index is 34.79 kg/m.  GENERAL: vitals reviewed and listed above, alert, oriented, appears well hydrated and in no acute distress  HEENT: atraumatic, conjunttiva clear, no obvious abnormalities on inspection of external nose and ears  NECK: no obvious masses on inspection  LUNGS: clear to auscultation bilaterally, no wheezes, rales or rhonchi, good air movement  CV: HRRR, no peripheral edema  MS: moves all extremities without noticeable abnormality  PSYCH: pleasant and cooperative, no obvious depression or anxiety  ASSESSMENT AND PLAN:  Discussed the following assessment and plan:  Essential hypertension - Plan: Basic metabolic panel, CBC  BMI 95.1-88.4,ZYSAY - Obesity  Heat intolerance - Plan: TSH  -Blood pressure little elevated on arrival, diastolic mildly above goal on recheck -->  Discussed treatment options, she prefers to continue the hydrochlorothiazide and work on lifestyle change -Recommended a healthy low sugar diet, Mediterranean-style diet, regular aerobic exercise and a 10 pound weight reduction over the next 3-4 months was agreed upon with the patient -Labs today per orders, will check a TSH  -Advised to follow-up with GYN about hormonal concerns on the Mirena  -  Follow-up sooner as needed    Patient Instructions  BEFORE YOU LEAVE: -labs -follow up:  3-4 months  Continue the blood pressure medication (hydrochlorthiazide)  Healthy low sugar diet and regular aerobic exercise - goal to lose 10 lbs over next 3-4 months.  We have ordered labs or studies at this visit. It can take up to 1-2 weeks for results and processing. IF results require follow up or explanation, we will call you  with instructions. Clinically stable results will be released to your [facility]. If you have not heard from [place] or cannot find your results in [facility] in 2 weeks please contact our office at [phone].  If you are not yet signed up for [facility], please consider signing up.           Lucretia Kern, DO

## 2018-03-13 NOTE — Patient Instructions (Signed)
BEFORE YOU LEAVE: -labs -follow up:  3-4 months  Continue the blood pressure medication (hydrochlorthiazide)  Healthy low sugar diet and regular aerobic exercise - goal to lose 10 lbs over next 3-4 months.  We have ordered labs or studies at this visit. It can take up to 1-2 weeks for results and processing. IF results require follow up or explanation, we will call you with instructions. Clinically stable results will be released to your [facility]. If you have not heard from [place] or cannot find your results in [facility] in 2 weeks please contact our office at [phone].  If you are not yet signed up for [facility], please consider signing up.

## 2018-03-17 MED FILL — HYDROCHLOROTHIAZIDE 25 MG T: 25 | 30 days supply | Qty: 30 | Fill #1

## 2018-04-23 ENCOUNTER — Encounter: Payer: Self-pay | Admitting: Pulmonary Disease

## 2018-04-23 ENCOUNTER — Ambulatory Visit (INDEPENDENT_AMBULATORY_CARE_PROVIDER_SITE_OTHER): Payer: 59 | Admitting: Pulmonary Disease

## 2018-04-23 ENCOUNTER — Other Ambulatory Visit: Payer: Self-pay | Admitting: Family Medicine

## 2018-04-23 VITALS — BP 128/82 | HR 87 | Ht 64.0 in | Wt 208.0 lb

## 2018-04-23 DIAGNOSIS — G4733 Obstructive sleep apnea (adult) (pediatric): Secondary | ICD-10-CM | POA: Diagnosis not present

## 2018-04-23 NOTE — Progress Notes (Signed)
Subjective:    Patient ID: Ashley Summers, female    DOB: 07/23/1981, 37 y.o.   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 She has gained about 20 pounds in the last 2 years and has recently been diagnosed with hypertension.  She reports loud snoring that has been noted by her boyfriend and her daughter sleeps in a different room.  She reports non-refreshing sleep in spite of good sleep quality.  She reports losing her breath some nights during sleep. Epworth sleepiness score is 8 Bedtime is between 1030 and 11 PM, sleep latency varies but is generally less than 30 minutes, she sleeps on her side with 2 pillows, snoring is worse when she lies on her back, reports 1-2 nocturnal awakenings including nocturia and is out of bed by 7 AM feeling tired with dryness of mouth.  She has occasional headaches which have decreased now that she is started on thiazide diuretic. There is no history suggestive of cataplexy, sleep paralysis or parasomnias  She drinks 2 cups of coffee daily and denies excessive caffeinated beverages. Blood work was reviewed which shows normal TSH  Past Medical History:  Diagnosis Date  . Allergy    Past Surgical History:  Procedure Laterality Date  . BREAST SURGERY     Reduction   . Hammertoe correction     Allergies  Allergen Reactions  . Penicillins Rash    Social History   Socioeconomic History  . Marital status: Single    Spouse name: Not on file  . Number of children: Not on file  . Years of education: Not on file  . Highest education level: Not on file  Occupational History  . Not on file  Social Needs  . Financial resource strain: Not on file  . Food insecurity:    Worry: Not on file    Inability: Not on file  . Transportation needs:    Medical: Not on file    Non-medical: Not on file  Tobacco Use  . Smoking status: Never Smoker  . Smokeless tobacco: Never Used  Substance and  Sexual Activity  . Alcohol use: Yes    Alcohol/week: 0.0 oz    Comment: occasional  . Drug use: No  . Sexual activity: Yes    Birth control/protection: Condom, IUD  Lifestyle  . Physical activity:    Days per week: Not on file    Minutes per session: Not on file  . Stress: Not on file  Relationships  . Social connections:    Talks on phone: Not on file    Gets together: Not on file    Attends religious service: Not on file    Active member of club or organization: Not on file    Attends meetings of clubs or organizations: Not on file    Relationship status: Not on file  . Intimate partner violence:    Fear of current or ex partner: Not on file    Emotionally abused: Not on file    Physically abused: Not on file    Forced sexual activity: Not on file  Other Topics Concern  . Not on file  Social History Narrative   Work or School: Chartered certified accountant, [employer]      Home Situation: lives with 3 kids (17,15, 9)      Spiritual Beliefs: none      Lifestyle: diet is so so; has been trying to exercising  Family History  Problem Relation Age of Onset  . Hypertension Mother   . Stroke Mother      Review of Systems Positive for daytime fatigue, morning headaches  Constitutional: negative for anorexia, fevers and sweats  Eyes: negative for irritation, redness and visual disturbance  Ears, nose, mouth, throat, and face: negative for earaches, epistaxis, nasal congestion and sore throat  Respiratory: negative for cough, dyspnea on exertion, sputum and wheezing  Cardiovascular: negative for chest pain, dyspnea, lower extremity edema, orthopnea, palpitations and syncope  Gastrointestinal: negative for abdominal pain, constipation, diarrhea, melena, nausea and vomiting  Genitourinary:negative for dysuria, frequency and hematuria  Hematologic/lymphatic: negative for bleeding, easy bruising and lymphadenopathy  Musculoskeletal:negative for arthralgias, muscle  weakness and stiff joints  Neurological: negative for coordination problems, gait problems, headaches and weakness  Endocrine: negative for diabetic symptoms including polydipsia, polyuria and weight loss     Objective:   Physical Exam  Gen. Pleasant, well-nourished, in no distress ENT - no thrush, no post nasal drip, long uvula Neck: No JVD, no thyromegaly, no carotid bruits Lungs: no use of accessory muscles, no dullness to percussion, clear without rales or rhonchi  Cardiovascular: Rhythm regular, heart sounds  normal, no murmurs or gallops, no peripheral edema Musculoskeletal: No deformities, no cyanosis or clubbing        Assessment & Plan:

## 2018-04-23 NOTE — Patient Instructions (Signed)
Home sleep study 

## 2018-04-23 NOTE — Assessment & Plan Note (Signed)
Given excessive daytime somnolence, narrow pharyngeal exam, witnessed apneas & loud snoring, obstructive sleep apnea is very likely & an overnight polysomnogram will be scheduled as a home study. The pathophysiology of obstructive sleep apnea , it's cardiovascular consequences & modes of treatment including CPAP were discused with the patient in detail & they evidenced understanding.  Pretest probability is intermediate.  She is very claustrophobic so increased requires CPAP, will need nasal pillows.  But dry mouth also indicates that she may have some level of mouth breathing

## 2018-04-24 MED FILL — HYDROCHLOROTHIAZIDE 25 MG T: 25 | 30 days supply | Qty: 30 | Fill #0

## 2018-05-26 MED FILL — HYDROCHLOROTHIAZIDE 25 MG T: 25 | 30 days supply | Qty: 30 | Fill #1

## 2018-06-25 MED FILL — HYDROCHLOROTHIAZIDE 25 MG T: 25 | 30 days supply | Qty: 30 | Fill #2

## 2018-07-14 ENCOUNTER — Ambulatory Visit: Payer: 59 | Admitting: Family Medicine

## 2018-08-15 MED FILL — HYDROCHLOROTHIAZIDE 25 MG T: 25 | 30 days supply | Qty: 30 | Fill #3

## 2018-09-17 DIAGNOSIS — H11131 Conjunctival pigmentations, right eye: Secondary | ICD-10-CM | POA: Diagnosis not present

## 2018-09-17 DIAGNOSIS — H5213 Myopia, bilateral: Secondary | ICD-10-CM | POA: Diagnosis not present

## 2018-09-17 DIAGNOSIS — H538 Other visual disturbances: Secondary | ICD-10-CM | POA: Diagnosis not present

## 2018-09-17 MED FILL — HYDROCHLOROTHIAZIDE 25 MG T: 25 | 30 days supply | Qty: 30 | Fill #4

## 2018-10-22 ENCOUNTER — Ambulatory Visit (INDEPENDENT_AMBULATORY_CARE_PROVIDER_SITE_OTHER): Payer: 59 | Admitting: Advanced Practice Midwife

## 2018-10-22 ENCOUNTER — Encounter: Payer: Self-pay | Admitting: Advanced Practice Midwife

## 2018-10-22 VITALS — BP 134/86 | HR 65 | Ht 64.0 in | Wt 204.0 lb

## 2018-10-22 DIAGNOSIS — Z01419 Encounter for gynecological examination (general) (routine) without abnormal findings: Secondary | ICD-10-CM | POA: Diagnosis not present

## 2018-10-22 DIAGNOSIS — N951 Menopausal and female climacteric states: Secondary | ICD-10-CM

## 2018-10-22 DIAGNOSIS — N898 Other specified noninflammatory disorders of vagina: Secondary | ICD-10-CM

## 2018-10-22 DIAGNOSIS — Z124 Encounter for screening for malignant neoplasm of cervix: Secondary | ICD-10-CM

## 2018-10-22 DIAGNOSIS — Z113 Encounter for screening for infections with a predominantly sexual mode of transmission: Secondary | ICD-10-CM

## 2018-10-22 DIAGNOSIS — N946 Dysmenorrhea, unspecified: Secondary | ICD-10-CM

## 2018-10-22 MED ORDER — IBUPROFEN 600 MG PO TABS: 600.0000 mg | ORAL_TABLET | Freq: Four times a day (QID) | ORAL | 4 refills | Status: DC | PRN

## 2018-10-22 MED ORDER — TRAMADOL HCL 50 MG PO TABS: 50.0000 mg | ORAL_TABLET | Freq: Four times a day (QID) | ORAL | 4 refills | Status: DC | PRN

## 2018-10-22 MED FILL — IBUPROFEN 600 MG TABLET: 600 | 8 days supply | Qty: 30 | Fill #0

## 2018-10-22 MED FILL — traMADol HCL 50 MG TABS: 50 | 8 days supply | Qty: 30 | Fill #0

## 2018-10-22 MED FILL — HYDROCHLOROTHIAZIDE 25 MG T: 25 | 30 days supply | Qty: 30 | Fill #5

## 2018-10-22 NOTE — Progress Notes (Signed)
Subjective:     Ashley Summers is a 37 y.o. female here for a routine exam.  Current complaints: cramping monthly but light bleeding only with IUD, hot flashes x 2-3 months, low sex drive and depressive symptoms.  Pt has appt with behavioral health and primary care but desires some testing of hormones with her symptoms.  Personal health questionnaire reviewed: yes.   Gynecologic History No LMP recorded. (Menstrual status: IUD). Contraception: IUD Last Pap: 10/2017. Results were: normal Last mammogram: n/a. Results were: n/a  Obstetric History OB History  Gravida Para Term Preterm AB Living  3 3 1 2   3   SAB TAB Ectopic Multiple Live Births          3    # Outcome Date GA Lbr Len/2nd Weight Sex Delivery Anes PTL Lv  3 Term 10/13/04 [redacted]w[redacted]d   F VBAC None  LIV  2 Preterm 09/07/99 [redacted]w[redacted]d   M CS-LTranv EPI  LIV  1 Preterm 03/21/97 [redacted]w[redacted]d   M Vag-Spont None  LIV     The following portions of the patient's history were reviewed and updated as appropriate: allergies, current medications, past family history, past medical history, past social history, past surgical history and problem list.  Review of Systems A comprehensive review of systems was negative.    Objective:     BP 134/86   Pulse 65   Ht 5\' 4"  (1.626 m)   Wt 92.5 kg   BMI 35.02 kg/m    VS reviewed, nursing note reviewed,  Constitutional: well developed, well nourished, no distress HEENT: normocephalic CV: normal rate Pulm/chest wall: normal effort Breast Exam:  right breast normal without mass, skin or nipple changes or axillary nodes, left breast normal without mass, skin or nipple changes or axillary nodes Abdomen: soft Neuro: alert and oriented x 3 Skin: warm, dry Psych: affect normal Pelvic exam: Deferred.  Vaginal cultures collected.   Assessment/Plan:   1. Women's annual routine gynecological examination   2. Screening for cervical cancer --Pt had Pap 1 year ago with negative HPV. She needs IUD replaced  next year since it was placed in 2015. Discussed options with pt including Pap every year vs Pap next year with IUD placement and Q 5 years if desired per guidelines.   --Plan to do Pap next year and replace Mirena IUD.  3. Screening examination for STD (sexually transmitted disease) - Cervicovaginal ancillary only - Hepatitis B surface antigen - Hepatitis C antibody - RPR - HIV Antibody (routine testing w rflx)  4. Perimenopausal symptoms - TSH - Prolactin - Testosterone, Free, Total, SHBG --Pt to f/u with primary care and behavioral health as scheduled.  5.  Dysmenorrhea --Heat/ice/ibuprofen --Rx for ibuprofen and renewed Rx for Tramadol to use sparingly  Fatima Blank, CNM 9:15 AM

## 2018-10-22 NOTE — Progress Notes (Signed)
Patient presents for Annual Exam today.  CC:  NONE However wants to discuss hormone labs.   LMP irregular per pt last period was 08/2018 lasted 2 days   Last pap:10/21/2017  WNL   Contraception: IUD Mirena inserted 06/24/2014 per notes at last [facility]EX.   STD Screening:desires full panel

## 2018-10-22 NOTE — Patient Instructions (Signed)
Dysmenorrhea Menstrual cramps (dysmenorrhea) are caused by the muscles of the uterus tightening (contracting) during a menstrual period. For some women, this discomfort is merely bothersome. For others, dysmenorrhea can be severe enough to interfere with everyday activities for a few days each month. Primary dysmenorrhea is menstrual cramps that last a couple of days when you start having menstrual periods or soon after. This often begins after a teenager starts having her period. As a woman gets older or has a baby, the cramps will usually lessen or disappear. Secondary dysmenorrhea begins later in life, lasts longer, and the pain may be stronger than primary dysmenorrhea. The pain may start before the period and last a few days after the period. What are the causes? Dysmenorrhea is usually caused by an underlying problem, such as:  The tissue lining the uterus grows outside of the uterus in other areas of the body (endometriosis).  The endometrial tissue, which normally lines the uterus, is found in or grows into the muscular walls of the uterus (adenomyosis).  The pelvic blood vessels are engorged with blood just before the menstrual period (pelvic congestive syndrome).  Overgrowth of cells (polyps) in the lining of the uterus or cervix.  Falling down of the uterus (prolapse) because of loose or stretched ligaments.  Depression.  Bladder problems, infection, or inflammation.  Problems with the intestine, a tumor, or irritable bowel syndrome.  Cancer of the female organs or bladder.  A severely tipped uterus.  A very tight opening or closed cervix.  Noncancerous tumors of the uterus (fibroids).  Pelvic inflammatory disease (PID).  Pelvic scarring (adhesions) from a previous surgery.  Ovarian cyst.  An intrauterine device (IUD) used for birth control.  What increases the risk? You may be at greater risk of dysmenorrhea if:  You are younger than age 44.  You started puberty  early.  You have irregular or heavy bleeding.  You have never given birth.  You have a family history of this problem.  You are a smoker.  What are the signs or symptoms?  Cramping or throbbing pain in your lower abdomen.  Headaches.  Lower back pain.  Nausea or vomiting.  Diarrhea.  Sweating or dizziness.  Loose stools. How is this diagnosed? A diagnosis is based on your history, symptoms, physical exam, diagnostic tests, or procedures. Diagnostic tests or procedures may include:  Blood tests.  Ultrasonography.  An examination of the lining of the uterus (dilation and curettage, D&C).  An examination inside your abdomen or pelvis with a scope (laparoscopy).  X-rays.  CT scan.  MRI.  An examination inside the bladder with a scope (cystoscopy).  An examination inside the intestine or stomach with a scope (colonoscopy, gastroscopy).  How is this treated? Treatment depends on the cause of the dysmenorrhea. Treatment may include:  Pain medicine prescribed by your health care provider.  Birth control pills or an IUD with progesterone hormone in it.  Hormone replacement therapy.  Nonsteroidal anti-inflammatory drugs (NSAIDs). These may help stop the production of prostaglandins.  Surgery to remove adhesions, endometriosis, ovarian cyst, or fibroids.  Removal of the uterus (hysterectomy).  Progesterone shots to stop the menstrual period.  Cutting the nerves on the sacrum that go to the female organs (presacral neurectomy).  Electric current to the sacral nerves (sacral nerve stimulation).  Antidepressant medicine.  Psychiatric therapy, counseling, or group therapy.  Exercise and physical therapy.  Meditation and yoga therapy.  Acupuncture.  Follow these instructions at home:  Only take over-the-counter or  prescription medicines as directed by your health care provider.  Place a heating pad or hot water bottle on your lower back or abdomen. Do  not sleep with the heating pad.  Use aerobic exercises, walking, swimming, biking, and other exercises to help lessen the cramping.  Massage to the lower back or abdomen may help.  Stop smoking.  Avoid alcohol and caffeine. Contact a health care provider if:  Your pain does not get better with medicine.  You have pain with sexual intercourse.  Your pain increases and is not controlled with medicines.  You have abnormal vaginal bleeding with your period.  You develop nausea or vomiting with your period that is not controlled with medicine. Get help right away if: You pass out. This information is not intended to replace advice given to you by your health care provider. Make sure you discuss any questions you have with your health care provider. Document Released: 11/19/2005 Document Revised: 04/26/2016 Document Reviewed: 05/07/2013 Elsevier Interactive Patient Education  2017 [place] is the time when your body begins to move into the menopause (no menstrual period for 12 straight months). It is a natural process. Perimenopause can begin 2-8 years before the menopause and usually lasts for 1 year after the menopause. During this time, your ovaries may or may not produce an egg. The ovaries vary in their production of estrogen and progesterone hormones each month. This can cause irregular menstrual periods, difficulty getting pregnant, vaginal bleeding between periods, and uncomfortable symptoms. What are the causes?  Irregular production of the ovarian hormones, estrogen and progesterone, and not ovulating every month. Other causes include:  Tumor of the pituitary gland in the brain.  Medical disease that affects the ovaries.  Radiation treatment.  Chemotherapy.  Unknown causes.  Heavy smoking and excessive alcohol intake can bring on perimenopause sooner.  What are the signs or symptoms?  Hot flashes.  Night sweats.  Irregular  menstrual periods.  Decreased sex drive.  Vaginal dryness.  Headaches.  Mood swings.  Depression.  Memory problems.  Irritability.  Tiredness.  Weight gain.  Trouble getting pregnant.  The beginning of losing bone cells (osteoporosis).  The beginning of hardening of the arteries (atherosclerosis). How is this diagnosed? Your health care provider will make a diagnosis by analyzing your age, menstrual history, and symptoms. He or she will do a physical exam and note any changes in your body, especially your female organs. Female hormone tests may or may not be helpful depending on the amount of female hormones you produce and when you produce them. However, other hormone tests may be helpful to rule out other problems. How is this treated? In some cases, no treatment is needed. The decision on whether treatment is necessary during the perimenopause should be made by you and your health care provider based on how the symptoms are affecting you and your lifestyle. Various treatments are available, such as:  Treating individual symptoms with a specific medicine for that symptom.  Herbal medicines that can help specific symptoms.  Counseling.  Group therapy.  Follow these instructions at home:  Keep track of your menstrual periods (when they occur, how heavy they are, how long between periods, and how long they last) as well as your symptoms and when they started.  Only take over-the-counter or prescription medicines as directed by your health care provider.  Sleep and rest.  Exercise.  Eat a diet that contains calcium (good for your bones) and soy (acts like  the estrogen hormone).  Do not smoke.  Avoid alcoholic beverages.  Take vitamin supplements as recommended by your health care provider. Taking vitamin E may help in certain cases.  Take calcium and vitamin D supplements to help prevent bone loss.  Group therapy is sometimes helpful.  Acupuncture may help in  some cases. Contact a health care provider if:  You have questions about any symptoms you are having.  You need a referral to a specialist (gynecologist, psychiatrist, or psychologist). Get help right away if:  You have vaginal bleeding.  Your period lasts longer than 8 days.  Your periods are recurring sooner than 21 days.  You have bleeding after intercourse.  You have severe depression.  You have pain when you urinate.  You have severe headaches.  You have vision problems. This information is not intended to replace advice given to you by your health care provider. Make sure you discuss any questions you have with your health care provider. Document Released: 12/27/2004 Document Revised: 04/26/2016 Document Reviewed: 06/18/2013 Elsevier Interactive Patient Education  2017 [employer].

## 2018-10-23 LAB — CERVICOVAGINAL ANCILLARY ONLY
Bacterial vaginitis: NEGATIVE
CANDIDA VAGINITIS: NEGATIVE
Chlamydia: NEGATIVE
Neisseria Gonorrhea: NEGATIVE
Trichomonas: NEGATIVE

## 2018-10-24 LAB — TESTOSTERONE, FREE, TOTAL, SHBG
SEX HORMONE BINDING: 24.2 nmol/L — AB (ref 24.6–122.0)
TESTOSTERONE FREE: 3.6 pg/mL (ref 0.0–4.2)
TESTOSTERONE: 16 ng/dL (ref 8–48)

## 2018-10-24 LAB — TSH: TSH: 1.58 u[IU]/mL (ref 0.450–4.500)

## 2018-10-24 LAB — PROLACTIN: Prolactin: 18.6 ng/mL (ref 4.8–23.3)

## 2018-10-24 LAB — HIV ANTIBODY (ROUTINE TESTING W REFLEX): HIV SCREEN 4TH GENERATION: NONREACTIVE

## 2018-10-24 LAB — HEPATITIS C ANTIBODY: Hep C Virus Ab: <0.1 s/co ratio (ref 0.0–0.9)

## 2018-10-24 LAB — HEPATITIS B SURFACE ANTIGEN: HEP B S AG: NEGATIVE

## 2018-10-24 LAB — RPR: RPR Ser Ql: NONREACTIVE

## 2018-11-08 NOTE — Progress Notes (Signed)
HPI:  Using dictation device. Unfortunately this device frequently misinterprets words/phrases.  Here for CPE: Due for flu vaccine.  -Concerns and/or follow up today:  PMH elevated blood pressure on diuretic, obesity and hypertriglyceridemia. Currently dealing with some mild depression - see phq9. She denies any severe symptoms and has set up appt with behavioral health at work today for this. Agrees to follow up here if any worsening or concerns.   Sees gynecologist. Has mirena.  -Diet: variety of foods, balance and well rounded, larger portion sizes -Exercise: no regular exercise -Taking folic acid, vitamin D or calcium: no -Diabetes and Dyslipidemia Screening: fasting for labs -Vaccines: see vaccine section EPIC -pap history: sees gyn, utd -FDLMP: see nursing notes -sexual activity: sees gyn -wants STI testing (Hep C if born [birth year]-65): no -FH breast, colon or ovarian ca: see FH Last mammogram: n/a Last colon cancer screening: n/a Breast Ca Risk Assessment: see family history and pt history DEXA (>/= 65): n/a  -Alcohol, Tobacco, drug use: see social history  Review of Systems - no fevers, unintentional weight loss, vision loss, hearing loss, chest pain, sob, hemoptysis, melena, hematochezia, hematuria, genital discharge, changing or concerning skin lesions, bleeding, bruising, loc, thoughts of self harm or SI  Past Medical History:  Diagnosis Date  . Allergy   . Hypertension     Past Surgical History:  Procedure Laterality Date  . BREAST SURGERY     Reduction   . Hammertoe correction      Family History  Problem Relation Age of Onset  . Hypertension Mother   . Stroke Mother     Social History   Socioeconomic History  . Marital status: Single    Spouse name: Not on file  . Number of children: Not on file  . Years of education: Not on file  . Highest education level: Not on file  Occupational History  . Not on file  Social Needs  . Financial resource  strain: Not on file  . Food insecurity:    Worry: Not on file    Inability: Not on file  . Transportation needs:    Medical: Not on file    Non-medical: Not on file  Tobacco Use  . Smoking status: Never Smoker  . Smokeless tobacco: Never Used  Substance and Sexual Activity  . Alcohol use: Yes    Alcohol/week: 0.0 standard drinks    Comment: occasional  . Drug use: No  . Sexual activity: Yes    Partners: Male    Birth control/protection: IUD  Lifestyle  . Physical activity:    Days per week: Not on file    Minutes per session: Not on file  . Stress: Not on file  Relationships  . Social connections:    Talks on phone: Not on file    Gets together: Not on file    Attends religious service: Not on file    Active member of club or organization: Not on file    Attends meetings of clubs or organizations: Not on file    Relationship status: Not on file  Other Topics Concern  . Not on file  Social History Narrative   Work or School: Chartered certified accountant, [employer]      Home Situation: lives with 3 kids (17,15, 9)      Spiritual Beliefs: none      Lifestyle: diet is so so; has been trying to exercising              Current  Outpatient Medications:  .  cetirizine (ZYRTEC) 10 MG tablet, Take 10 mg by mouth daily. Reported on 02/02/2016, Disp: , Rfl:  .  hydrochlorothiazide (HYDRODIURIL) 25 MG tablet, TAKE 1 TABLET (25 MG TOTAL) BY MOUTH DAILY., Disp: 30 tablet, Rfl: 5 .  ibuprofen (ADVIL,MOTRIN) 600 MG tablet, Take 1 tablet (600 mg total) by mouth every 6 (six) hours as needed., Disp: 30 tablet, Rfl: 4 .  levonorgestrel (MIRENA) 20 MCG/24HR IUD, 1 each by Intrauterine route once., Disp: , Rfl:  .  traMADol (ULTRAM) 50 MG tablet, Take 1 tablet (50 mg total) by mouth every 6 (six) hours as needed for moderate pain or severe pain., Disp: 30 tablet, Rfl: 4  EXAM:  Vitals:   11/10/18 0807  BP: 120/82  Pulse: 71  Temp: 98.3 F (36.8 C)  Body mass index is 35.24  kg/m.   GENERAL: vitals reviewed and listed below, alert, oriented, appears well hydrated and in no acute distress  HEENT: head atraumatic, PERRLA, normal appearance of eyes, ears, nose and mouth. moist mucus membranes.  NECK: supple, no masses or lymphadenopathy  LUNGS: clear to auscultation bilaterally, no rales, rhonchi or wheeze  CV: HRRR, no peripheral edema or cyanosis, normal pedal pulses  ABDOMEN: bowel sounds normal, soft, non tender to palpation, no masses, no rebound or guarding  GU/BREAST: see gyn, deferred  SKIN: no rash or abnormal lesions  MS: normal gait, moves all extremities normally  NEURO: normal gait, speech and thought processing grossly intact, muscle tone grossly intact throughout  PSYCH: normal affect, pleasant and cooperative  ASSESSMENT AND PLAN:  Discussed the following assessment and plan:  PREVENTIVE EXAM: -Discussed and advised all [place] preventive services health task force level A and B recommendations for age, sex and risks. -Advised at least 150 minutes of exercise per week and a healthy diet with avoidance of (less then 1 serving per week) processed foods, white starches, red meat, fast foods and sweets and consisting of: * 5-9 servings of fresh fruits and vegetables (not corn or potatoes) *nuts and seeds, beans *olives and olive oil *lean meats such as fish and white chicken  *whole grains -labs, studies and vaccines per orders this encounter -offered help with the depression, she declined as is set up to see behavioral healthy through her employer, agrees to follow up here as needed for this if worsening or not improving.  Patient advised to return to clinic immediately if symptoms worsen or persist or new concerns.  Patient Instructions  BEFORE YOU LEAVE: -labs -follow up: yearly and as needed  Do the counseling as planned. Please follow up hear if not improving or any worsening of symptoms.  Increase aerobic exercise to 150  minutes per week.  Eat a healthy low sugar/low carb diet - the Mediterranean or DASH diet are good options.  We have ordered labs or studies at this visit. It can take up to 1-2 weeks for results and processing. IF results require follow up or explanation, we will call you with instructions. Clinically stable results will be released to your [facility]. If you have not heard from [place] or cannot find your results in [facility] in 2 weeks please contact our office at [phone].  If you are not yet signed up for [facility] please consider signing up.           No follow-ups on file.  Lucretia Kern, DO

## 2018-11-10 ENCOUNTER — Encounter: Payer: Self-pay | Admitting: Family Medicine

## 2018-11-10 ENCOUNTER — Ambulatory Visit (INDEPENDENT_AMBULATORY_CARE_PROVIDER_SITE_OTHER): Payer: 59 | Admitting: Family Medicine

## 2018-11-10 VITALS — BP 110/70 | HR 71 | Temp 98.3°F | Ht 64.0 in | Wt 205.3 lb

## 2018-11-10 DIAGNOSIS — Z1331 Encounter for screening for depression: Secondary | ICD-10-CM | POA: Diagnosis not present

## 2018-11-10 DIAGNOSIS — I10 Essential (primary) hypertension: Secondary | ICD-10-CM

## 2018-11-10 DIAGNOSIS — F320 Major depressive disorder, single episode, mild: Secondary | ICD-10-CM | POA: Diagnosis not present

## 2018-11-10 DIAGNOSIS — E781 Pure hyperglyceridemia: Secondary | ICD-10-CM | POA: Diagnosis not present

## 2018-11-10 DIAGNOSIS — Z0000 Encounter for general adult medical examination without abnormal findings: Secondary | ICD-10-CM

## 2018-11-10 LAB — BASIC METABOLIC PANEL
BUN: 16 mg/dL (ref 6–23)
CO2: 28 meq/L (ref 19–32)
Calcium: 9.3 mg/dL (ref 8.4–10.5)
Chloride: 99 mEq/L (ref 96–112)
Creatinine, Ser: 0.99 mg/dL (ref 0.40–1.20)
GFR: 79.99 mL/min (ref >60.00)
GLUCOSE: 99 mg/dL (ref 70–99)
POTASSIUM: 3.8 meq/L (ref 3.5–5.1)
SODIUM: 136 meq/L (ref 135–145)

## 2018-11-10 LAB — CBC
HCT: 41.8 % (ref 36.0–46.0)
HEMOGLOBIN: 14.2 g/dL (ref 12.0–15.0)
MCHC: 34 g/dL (ref 30.0–36.0)
MCV: 90.9 fl (ref 78.0–100.0)
PLATELETS: 200 10*3/uL (ref 150.0–400.0)
RBC: 4.6 Mil/uL (ref 3.87–5.11)
RDW: 13.2 % (ref 11.5–15.5)
WBC: 5.4 10*3/uL (ref 4.0–10.5)

## 2018-11-10 LAB — LIPID PANEL
CHOL/HDL RATIO: 4
Cholesterol: 183 mg/dL (ref 0–200)
HDL: 52.2 mg/dL (ref >39.00)
LDL CALC: 101 mg/dL — AB (ref 0–99)
NONHDL: 131.03
Triglycerides: 151 mg/dL — ABNORMAL HIGH (ref 0.0–149.0)
VLDL: 30.2 mg/dL (ref 0.0–40.0)

## 2018-11-10 NOTE — Patient Instructions (Signed)
BEFORE YOU LEAVE: -labs -follow up: yearly and as needed  Do the counseling as planned. Please follow up hear if not improving or any worsening of symptoms.  Increase aerobic exercise to 150 minutes per week.  Eat a healthy low sugar/low carb diet - the Mediterranean or DASH diet are good options.  We have ordered labs or studies at this visit. It can take up to 1-2 weeks for results and processing. IF results require follow up or explanation, we will call you with instructions. Clinically stable results will be released to your [facility]. If you have not heard from [place] or cannot find your results in [facility] in 2 weeks please contact our office at [phone].  If you are not yet signed up for [facility], please consider signing up.

## 2018-12-26 ENCOUNTER — Other Ambulatory Visit: Payer: Self-pay | Admitting: Family Medicine

## 2018-12-26 MED ORDER — HYDROCHLOROTHIAZIDE 25 MG PO TABS: 25.0000 mg | ORAL_TABLET | Freq: Every day | ORAL | 2 refills | Status: DC

## 2018-12-26 MED FILL — HYDROCHLOROTHIAZIDE 25 MG T: 25 | 90 days supply | Qty: 90 | Fill #0

## 2018-12-26 NOTE — Telephone Encounter (Signed)
Next OV in 10 months Requested Prescriptions  Pending Prescriptions Disp Refills  . hydrochlorothiazide (HYDRODIURIL) 25 MG tablet 90 tablet 2    Sig: Take 1 tablet (25 mg total) by mouth daily.     Cardiovascular: Diuretics - Thiazide Passed - 12/26/2018 10:19 AM      Passed - Ca in normal range and within 360 days    Calcium  Date Value Ref Range Status  11/10/2018 9.3 8.4 - 10.5 mg/dL Final         Passed - Cr in normal range and within 360 days    Creat  Date Value Ref Range Status  09/20/2015 0.90 0.50 - 1.10 mg/dL Final   Creatinine, Ser  Date Value Ref Range Status  11/10/2018 0.99 0.40 - 1.20 mg/dL Final         Passed - K in normal range and within 360 days    Potassium  Date Value Ref Range Status  11/10/2018 3.8 3.5 - 5.1 mEq/L Final         Passed - Na in normal range and within 360 days    Sodium  Date Value Ref Range Status  11/10/2018 136 135 - 145 mEq/L Final  11/08/2017 140 134 - 144 mmol/L Final         Passed - Last BP in normal range    BP Readings from Last 1 Encounters:  11/10/18 110/70         Passed - Valid encounter within last 6 months    Recent Outpatient Visits          1 month ago Encounter for preventive health examination   Therapist, [facility] at [employer], [place], DO   9 months ago Essential hypertension   Therapist, music at [employer], [place], DO   10 months ago Hypertension, essential   Therapist, music at [employer], [place] [employer], DO   1 year ago Encounter for preventive health examination   Therapist, music at [employer], [place], DO   1 year ago Right knee pain, unspecified chronicity   Therapist, music at [employer], [place], DO      Future Appointments            In 10 months Maudie Mercury, [place], [place] [facility] [place], [facility]

## 2018-12-26 NOTE — Telephone Encounter (Signed)
Copied from [place] [id number]. Topic: Quick Communication - Rx Refill/Question >> Dec 26, 2018 10:16 AM Alanda Slim E wrote: Medication: hydrochlorothiazide (HYDRODIURIL) 25 MG tablet  Has the patient contacted their pharmacy? Yes(no refills available, call PCP)    Preferred Pharmacy (with phone number or street name): [place], [place] - [address] [phone] (Phone) [phone] (Fax)    Agent: Please be advised that RX refills may take up to 3 business days. We ask that you follow-up with your pharmacy.

## 2019-01-07 ENCOUNTER — Ambulatory Visit: Payer: Self-pay

## 2019-01-07 MED ORDER — OSELTAMIVIR PHOSPHATE 75 MG PO CAPS: 75.0000 mg | ORAL_CAPSULE | Freq: Every day | ORAL | 0 refills | Status: DC

## 2019-01-07 NOTE — Telephone Encounter (Signed)
[place] for tamiflu 75mg  daily x 7 days

## 2019-01-07 NOTE — Telephone Encounter (Signed)
Patient called and says she works in a [employer] and 2 of her co-workers have been diagnosed with the flu. One was yesterday and today another went to the doctor and was diagnosed. 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 She says she doesn't have any symptoms. I advised someone form the office will call with Dr. Julianne Rice recommendations. She says to call her cell phone and leave a VM and she will get it.    Reason for Disposition . [1] Influenza EXPOSURE within last 48 hours (2 days) AND [2] NOT HIGH RISK AND [3] strongly requests antiviral medication  Answer Assessment - Initial Assessment Questions 1. TYPE of EXPOSURE: "How were you exposed?" (e.g., close contact, not a close contact)     Co-workers at [place] office 2. DATE of EXPOSURE: "When did the exposure occur?" (e.g., hour, days, weeks)     1 co-worker diagnosed yesterday and 1 this morning 3. PREGNANCY: "Is there any chance you are pregnant?" "When was your last menstrual period?"     No 4. HIGH RISK for COMPLICATIONS: "Do you have any heart or lung problems? Do you have a weakened immune system?" (e.g., CHF, COPD, asthma, HIV positive, chemotherapy, renal failure, diabetes mellitus, sickle cell anemia)     No 5. SYMPTOMS: "Do you have any symptoms?" (e.g., cough, fever, sore throat, difficulty breathing).     No  Protocols used: INFLUENZA EXPOSURE-A-AH

## 2019-01-07 NOTE — Telephone Encounter (Signed)
I called the pt and informed her the Rx was sent to her pharmacy. 

## 2019-01-07 NOTE — Telephone Encounter (Signed)
Patient called, left VM to return call to discuss the request for Tamiflu.

## 2019-02-17 NOTE — Telephone Encounter (Signed)
RA please advise on patients email. Thank you.

## 2019-02-18 NOTE — Telephone Encounter (Signed)
Please schedule home sleep test and office visit 1 wk after with NP/me to discuss results

## 2019-02-18 NOTE — Telephone Encounter (Signed)
Dr Elsworth Soho,  We have been informed this morning that due to COVID-19, home sleep test are on hold, unless urgent.  Please advise

## 2019-02-19 NOTE — Telephone Encounter (Signed)
Okay to schedule when available

## 2019-03-09 ENCOUNTER — Ambulatory Visit: Payer: Self-pay | Admitting: Family Medicine

## 2019-03-09 NOTE — Telephone Encounter (Signed)
Pt. Reports she started having twitching on the right side of her face Sat. Morning.Under her eye and her cheek. No numbness or facial drooping noted. Spoke with Jaimie in the practice and she will speak with pt. And schedule.  Reason for Disposition . [1] Muscle twitch (spasm) also happens in face (e.g., cheek, jaw area) AND [2] brief (a second or two; not present now)  Answer Assessment - Initial Assessment Questions 1. APPEARANCE of MOVEMENT: "What did the jerking or twitching look like?" (e.g., body area) 2.  ONSET: "When did this start happening?" (e.g., hours, days, weeks, months ago) 3.  DURATION: "How long does the jerk, twitch, or spasm last?" 4.  FREQUENCY:  "How often does this happen?"      Right side of face under the eye and on the cheek 5. WHEN: "When does this happen?" (e.g., while awake, while falling asleep, while sleeping)     While awake - Saturday - all day long 6. CAUSE: "What do you think caused the eye twitching?"     Unsure 7. OTHER SYMPTOMS: "Are there any other symptoms?" (e.g., fever, headache)     Had a headache Sat. 8. PREGNANCY: "Is there any chance you are pregnant?" "When was your last menstrual period?"     No  Protocols used: EYELID TWITCH OR SPASM-A-AH, MUSCLE JERKS - TICS - SHUDDERS-A-AH

## 2019-03-09 NOTE — Telephone Encounter (Signed)
Pt scheduled with Dr Maudie Mercury Date: 03/12/2019    Time: 2:00 PM    Visit Type: OFFICE VISIT [1004]    Provider: Lucretia Kern, DO     Nothing further needed.

## 2019-03-12 ENCOUNTER — Encounter: Payer: Self-pay | Admitting: Family Medicine

## 2019-03-12 ENCOUNTER — Other Ambulatory Visit: Payer: Self-pay

## 2019-03-12 ENCOUNTER — Ambulatory Visit (INDEPENDENT_AMBULATORY_CARE_PROVIDER_SITE_OTHER): Payer: 59 | Admitting: Family Medicine

## 2019-03-12 DIAGNOSIS — R600 Localized edema: Secondary | ICD-10-CM

## 2019-03-12 DIAGNOSIS — F439 Reaction to severe stress, unspecified: Secondary | ICD-10-CM

## 2019-03-12 DIAGNOSIS — I10 Essential (primary) hypertension: Secondary | ICD-10-CM

## 2019-03-12 DIAGNOSIS — F339 Major depressive disorder, recurrent, unspecified: Secondary | ICD-10-CM | POA: Diagnosis not present

## 2019-03-12 DIAGNOSIS — F419 Anxiety disorder, unspecified: Secondary | ICD-10-CM | POA: Diagnosis not present

## 2019-03-12 DIAGNOSIS — R253 Fasciculation: Secondary | ICD-10-CM

## 2019-03-12 NOTE — Progress Notes (Signed)
Virtual Visit via Video Note  I connected with Ashley Summers on 03/12/19 at  2:00 PM EDT by a video enabled telemedicine application and verified that I am speaking with the correct person using two identifiers.  Location patient: home Location provider:work or home office Persons participating in the virtual visit: patient, provider  I discussed the limitations of evaluation and management by telemedicine and the availability of in person appointments. The patient expressed understanding and agreed to proceed.   HPI:  Ashley Summers has a PMH of HTN on diuretic, Obesity, CTS and hyperlipidemia. Taking her hctz 25mg  daily. BP 120/82. No exercise. Diet "pretty good" - baking her food. She has been very stressed - recently changed jobs. Has been depressed and anxious. She is seeing someone at employee health for counseling. No SI, thoughts of harm or hallucinations. She does not want to take any medications. Last weekend she had some twitching in her R cheek for intermittently for a few days. No fevers, vision changes, worst HA, numbness or other neurological symptoms.  ROS: See pertinent positives and negatives per HPI.  Past Medical History:  Diagnosis Date  . Allergy   . Hypertension     Past Surgical History:  Procedure Laterality Date  . BREAST SURGERY     Reduction   . Hammertoe correction      Family History  Problem Relation Age of Onset  . Hypertension Mother   . Stroke Mother     SOCIAL HX: see hpi   Current Outpatient Medications:  .  cetirizine (ZYRTEC) 10 MG tablet, Take 10 mg by mouth as needed. Reported on 02/02/2016, Disp: , Rfl:  .  hydrochlorothiazide (HYDRODIURIL) 25 MG tablet, Take 1 tablet (25 mg total) by mouth daily., Disp: 90 tablet, Rfl: 2 .  ibuprofen (ADVIL,MOTRIN) 600 MG tablet, Take 1 tablet (600 mg total) by mouth every 6 (six) hours as needed., Disp: 30 tablet, Rfl: 4 .  levonorgestrel (MIRENA) 20 MCG/24HR IUD, 1 each by Intrauterine route once., Disp: , Rfl:  .   traMADol (ULTRAM) 50 MG tablet, Take 1 tablet (50 mg total) by mouth every 6 (six) hours as needed for moderate pain or severe pain., Disp: 30 tablet, Rfl: 4  EXAM:  VITALS per patient if applicable: 716/96  GENERAL: alert, oriented, appears well and in no acute distress  HEENT: atraumatic, conjunttiva clear, no obvious abnormalities on inspection of external nose and ears  NECK: normal movements of the head and neck  LUNGS: on inspection no signs of respiratory distress, breathing rate appears normal, no obvious gross SOB, gasping or wheezing  CV: no obvious cyanosis, on inspection of the ankles no swelling, had he press for pitting edema and none is present  MS: moves all visible extremities without noticeable abnormality  PSYCH/NEURO: pleasant and cooperative, no obvious depression or anxiety, speech and thought processing grossly intact, on lengthy visual inspection of the face in good light and up close no fasciculations or asymmetry noted, CN II-XII grossly intact, finger to nose adapted normal.   ASSESSMENT AND PLAN:  Discussed the following assessment and plan:  Essential hypertension  Leg edema  Morbid obesity (HCC)  Stress  Anxiety  Depression, recurrent (HCC)  Fasciculation  Discussed her health, concerns, evaluation, risks, potential etiologies and work up at length.  She prefers to continue with CBT for the stress/anxiety and declined medications.  Advised healthy diet, regular exercise, hydration and adequate sleep. ? Twitch in the face resolved and opted to monitor. Visual inspection and exam today  show no deficits. Advised follow up 2-3 weeks with new PCP and call in interim if recurrent or any other symptoms or concerns.   I discussed the assessment and treatment plan with the patient. The patient was provided an opportunity to ask questions and all were answered. The patient agreed with the plan and demonstrated an understanding of the instructions.    No edema on exam today. Advised healthy diet, regular exercise, compression/elevation if needed, continue diuretic.  The patient was advised to call back or seek an in-person evaluation if the symptoms worsen or if the condition fails to improve as anticipated.   Follow up instructions: Advised assistant Wendie Simmer to help patient arrange the following: -follow up/TOC with one of our providers in 3-4 weeks, she plans to look online at bios/pictures after this visit so that you can assist her when you call her.   Lucretia Kern, DO

## 2019-04-10 ENCOUNTER — Other Ambulatory Visit: Payer: Self-pay

## 2019-04-10 ENCOUNTER — Encounter: Payer: Self-pay | Admitting: Family Medicine

## 2019-04-10 ENCOUNTER — Ambulatory Visit (INDEPENDENT_AMBULATORY_CARE_PROVIDER_SITE_OTHER): Payer: 59 | Admitting: Family Medicine

## 2019-04-10 DIAGNOSIS — I10 Essential (primary) hypertension: Secondary | ICD-10-CM | POA: Diagnosis not present

## 2019-04-10 DIAGNOSIS — F410 Panic disorder [episodic paroxysmal anxiety] without agoraphobia: Secondary | ICD-10-CM

## 2019-04-10 DIAGNOSIS — E6609 Other obesity due to excess calories: Secondary | ICD-10-CM

## 2019-04-10 DIAGNOSIS — G5603 Carpal tunnel syndrome, bilateral upper limbs: Secondary | ICD-10-CM | POA: Diagnosis not present

## 2019-04-10 DIAGNOSIS — G4733 Obstructive sleep apnea (adult) (pediatric): Secondary | ICD-10-CM | POA: Diagnosis not present

## 2019-04-10 NOTE — Progress Notes (Signed)
Virtual Visit via Video Note  I connected with Ashley Summers on 04/10/19 at  1:00 PM EDT by a video enabled telemedicine application and verified that I am speaking with the correct person using two identifiers.  Location patient: home Location provider:work or home office Persons participating in the virtual visit: patient, provider  I discussed the limitations of evaluation and management by telemedicine and the availability of in person appointments. The patient expressed understanding and agreed to proceed.   HPI: Pt seen for f/u on chronic conditions and TOC, previously seen by Dr. Maudie Mercury.  OSA: had an initial sleep study.  Needs to repeat test wearing CPAP.  Has yet to schedule.  HTN:  Not checking bp at home, will check at work.  Yesterday BP was 128/88.  Taking HCTZ 25 mg daily.  Working out and walking for exercise.  Cooks at home. Does not really eat much salt.  Drinking 2 "jugs of water" at work and half of one at home.    Carpal tunnel:  Had nerve conduction studies in the past. Getting worse.  Hands numb and tingle at night waking pt up.  Feeling at times goes up her arm.  B/l wrist hurt.  Wears splint at night.  Tried ibuprofen.   Panic attacks:  Feeling claustrophobic out in public x 2 yrs or while in the car x 1 yr.  Worsening.  Feels like she can't breathe and heart is racing.  Does ok if she is driving.  Notes feeling more if riding in the back seat or in smaller cars.  Sleep is interrupted as up 2/2 pain from carpal tunnel.  Denies increased stress.  Feels like had more stress at her previous job.  Pt does note her mom had stroke 2 wks ago, but is doing well.  In counseling through [facility].  Has never been on medication.  Obesity:  Trying to increase activity to lose weight.  Not seeing change in weight.   States was on phentermine in the past.  Interested in possibly restarting.  ROS: See pertinent positives and negatives per HPI.  Past Medical History:  Diagnosis Date  .  Allergy   . Hypertension     Past Surgical History:  Procedure Laterality Date  . BREAST SURGERY     Reduction   . Hammertoe correction      Family History  Problem Relation Age of Onset  . Hypertension Mother   . Stroke Mother     SOCIAL HX:    Current Outpatient Medications:  .  cetirizine (ZYRTEC) 10 MG tablet, Take 10 mg by mouth as needed. Reported on 02/02/2016, Disp: , Rfl:  .  hydrochlorothiazide (HYDRODIURIL) 25 MG tablet, Take 1 tablet (25 mg total) by mouth daily., Disp: 90 tablet, Rfl: 2 .  ibuprofen (ADVIL,MOTRIN) 600 MG tablet, Take 1 tablet (600 mg total) by mouth every 6 (six) hours as needed., Disp: 30 tablet, Rfl: 4 .  levonorgestrel (MIRENA) 20 MCG/24HR IUD, 1 each by Intrauterine route once., Disp: , Rfl:  .  traMADol (ULTRAM) 50 MG tablet, Take 1 tablet (50 mg total) by mouth every 6 (six) hours as needed for moderate pain or severe pain., Disp: 30 tablet, Rfl: 4  EXAM:  VITALS per patient if applicable: RR between 40-81 bpm  GENERAL: alert, oriented, appears well and in no acute distress  HEENT: atraumatic, conjunctiva clear, no obvious abnormalities on inspection of external nose and ears  NECK: normal movements of the head and neck  LUNGS: on inspection no signs of respiratory distress, breathing rate appears normal, no obvious gross SOB, gasping or wheezing  CV: no obvious cyanosis  MS: moves all visible extremities without noticeable abnormality  PSYCH/NEURO: pleasant and cooperative, no obvious depression or anxiety, speech and thought processing grossly intact  ASSESSMENT AND PLAN:  Discussed the following assessment and plan:  Bilateral carpal tunnel syndrome -discussed various treatment options including NSAIDs, wrist splints, ergonomic modifications, PT, steroid injections, carpal tunnel release -pt considering options -will place referral if needed. -continue wrist splints at night  Panic attacks -increasing -discussed various  medications options.  Pt considering meds. -continue counseling with [facility].  Essential hypertension -stable -continue regular bp checks at work -continue HCTZ 25 mg daily -discussed lifestyle modifications -obtain bmp at next OFV.  Obesity due to excess calories with serious comorbidity, unspecified classification -discussed the importance of lifestyle modifications as pt has used phentermine in the past and is interested in restarting. -consider referral to nutrition -consider referral to weight management  OSA (obstructive sleep apnea) -pt encouraged to schedule f/u sleep study   f/u prn in the next 1-2 months, sooner if needed.  I discussed the assessment and treatment plan with the patient. The patient was provided an opportunity to ask questions and all were answered. The patient agreed with the plan and demonstrated an understanding of the instructions.   The patient was advised to call back or seek an in-person evaluation if the symptoms worsen or if the condition fails to improve as anticipated.  Billie Ruddy, MD

## 2019-05-01 ENCOUNTER — Encounter: Payer: Self-pay | Admitting: Family Medicine

## 2019-05-11 ENCOUNTER — Other Ambulatory Visit: Payer: Self-pay | Admitting: Family Medicine

## 2019-05-11 DIAGNOSIS — F410 Panic disorder [episodic paroxysmal anxiety] without agoraphobia: Secondary | ICD-10-CM

## 2019-05-11 MED ORDER — PAROXETINE HCL 10 MG PO TABS: 10.0000 mg | ORAL_TABLET | Freq: Every day | ORAL | 2 refills | Status: DC

## 2019-05-11 MED FILL — PARoxetine HCL 10 MG TABS: 10 | 30 days supply | Qty: 30 | Fill #0

## 2019-05-21 MED FILL — IBUPROFEN 600 MG TABLET: 600 | 8 days supply | Qty: 30 | Fill #1

## 2019-05-21 MED FILL — HYDROCHLOROTHIAZIDE 25 MG T: 25 | 90 days supply | Qty: 90 | Fill #1

## 2019-05-21 MED FILL — PARoxetine HCL 10 MG TABS: 10 | 30 days supply | Qty: 30 | Fill #0

## 2019-07-07 ENCOUNTER — Other Ambulatory Visit: Payer: Self-pay

## 2019-07-14 MED FILL — IBUPROFEN 600 MG TABLET: 600 | 8 days supply | Qty: 30 | Fill #2

## 2019-07-14 MED FILL — PARoxetine HCL 10 MG TABS: 10 | 30 days supply | Qty: 30 | Fill #1

## 2019-10-02 ENCOUNTER — Other Ambulatory Visit: Payer: Self-pay

## 2019-10-02 DIAGNOSIS — Z20822 Contact with and (suspected) exposure to covid-19: Secondary | ICD-10-CM

## 2019-10-05 LAB — NOVEL CORONAVIRUS, NAA: SARS-CoV-2, NAA: NOT DETECTED

## 2019-10-07 MED FILL — HYDROCHLOROTHIAZIDE 25 MG T: 25 | 90 days supply | Qty: 90 | Fill #2

## 2019-10-26 ENCOUNTER — Other Ambulatory Visit: Payer: Self-pay

## 2019-10-26 ENCOUNTER — Encounter: Payer: Self-pay | Admitting: Obstetrics and Gynecology

## 2019-10-26 ENCOUNTER — Ambulatory Visit (INDEPENDENT_AMBULATORY_CARE_PROVIDER_SITE_OTHER): Payer: 59 | Admitting: Obstetrics and Gynecology

## 2019-10-26 VITALS — BP 131/85 | HR 64 | Ht 64.0 in | Wt 203.0 lb

## 2019-10-26 DIAGNOSIS — Z01419 Encounter for gynecological examination (general) (routine) without abnormal findings: Secondary | ICD-10-CM | POA: Diagnosis not present

## 2019-10-26 DIAGNOSIS — Z124 Encounter for screening for malignant neoplasm of cervix: Secondary | ICD-10-CM | POA: Diagnosis not present

## 2019-10-26 DIAGNOSIS — Z30430 Encounter for insertion of intrauterine contraceptive device: Secondary | ICD-10-CM | POA: Diagnosis not present

## 2019-10-26 DIAGNOSIS — Z3202 Encounter for pregnancy test, result negative: Secondary | ICD-10-CM | POA: Diagnosis not present

## 2019-10-26 DIAGNOSIS — N898 Other specified noninflammatory disorders of vagina: Secondary | ICD-10-CM | POA: Diagnosis not present

## 2019-10-26 DIAGNOSIS — Z113 Encounter for screening for infections with a predominantly sexual mode of transmission: Secondary | ICD-10-CM

## 2019-10-26 DIAGNOSIS — Z1151 Encounter for screening for human papillomavirus (HPV): Secondary | ICD-10-CM | POA: Diagnosis not present

## 2019-10-26 DIAGNOSIS — Z30433 Encounter for removal and reinsertion of intrauterine contraceptive device: Secondary | ICD-10-CM

## 2019-10-26 LAB — POCT URINE PREGNANCY: Preg Test, Ur: NEGATIVE

## 2019-10-26 MED ORDER — IBUPROFEN 600 MG PO TABS: 600.0000 mg | ORAL_TABLET | Freq: Four times a day (QID) | ORAL | 4 refills | Status: DC | PRN

## 2019-10-26 MED ORDER — LEVONORGESTREL 20 MCG/24HR IU IUD
INTRAUTERINE_SYSTEM | Freq: Once | INTRAUTERINE | Status: AC
  Administered 2019-10-26: 1 via INTRAUTERINE

## 2019-10-26 MED ORDER — NYSTATIN 100000 UNIT/GM EX CREA: 1.0000 "application " | TOPICAL_CREAM | Freq: Two times a day (BID) | CUTANEOUS | 1 refills | Status: DC

## 2019-10-26 NOTE — Progress Notes (Signed)
Subjective:     Ashley Summers is a 38 y.o. female [phone] with BMI 34 and amenorrhea due to levonorgestrel IUD who is here for a comprehensive physical exam. The patient reports no problems. She is sexually active without complaints. She denies any pelvic pain or abnormal discharge. She denies any urinary incontinence  Past Medical History:  Diagnosis Date  . Allergy   . Hypertension    Past Surgical History:  Procedure Laterality Date  . BREAST SURGERY     Reduction   . Hammertoe correction     Family History  Problem Relation Age of Onset  . Hypertension Mother   . Stroke Mother     Social History   Socioeconomic History  . Marital status: Single    Spouse name: Not on file  . Number of children: Not on file  . Years of education: Not on file  . Highest education level: Not on file  Occupational History  . Not on file  Social Needs  . Financial resource strain: Not on file  . Food insecurity    Worry: Not on file    Inability: Not on file  . Transportation needs    Medical: Not on file    Non-medical: Not on file  Tobacco Use  . Smoking status: Never Smoker  . Smokeless tobacco: Never Used  Substance and Sexual Activity  . Alcohol use: Yes    Alcohol/week: 0.0 standard drinks    Comment: occasional  . Drug use: No  . Sexual activity: Yes    Partners: Male    Birth control/protection: I.U.D.  Lifestyle  . Physical activity    Days per week: Not on file    Minutes per session: Not on file  . Stress: Not on file  Relationships  . Social Herbalist on phone: Not on file    Gets together: Not on file    Attends religious service: Not on file    Active member of club or organization: Not on file    Attends meetings of clubs or organizations: Not on file    Relationship status: Not on file  . Intimate partner violence    Fear of current or ex partner: Not on file    Emotionally abused: Not on file    Physically abused: Not on file    Forced  sexual activity: Not on file  Other Topics Concern  . Not on file  Social History Narrative   Work or School: Chartered certified accountant, [employer]      Home Situation: lives with 3 kids (17,15, 9)      Spiritual Beliefs: none      Lifestyle: diet is so so; has been trying to exercising            Health Maintenance  Topic Date Due  . MAMMOGRAM  02/05/1997  . INFLUENZA VACCINE  07/04/2019  . PAP SMEAR-Modifier  10/21/2020  . TETANUS/TDAP  10/11/2024  . HIV Screening  Completed       Review of Systems Pertinent items are noted in HPI.   Objective:  Blood pressure 131/85, pulse 64, height 5\' 4"  (1.626 m), weight 203 lb (92.1 kg).     GENERAL: Well-developed, well-nourished female in no acute distress.  HEENT: Normocephalic, atraumatic. Sclerae anicteric.  NECK: Supple. Normal thyroid.  LUNGS: Clear to auscultation bilaterally.  HEART: Regular rate and rhythm. BREASTS: Symmetric in size. No palpable masses or lymphadenopathy, skin changes, or nipple drainage. ABDOMEN: Soft,  nontender, nondistended. No organomegaly. PELVIC: Normal external female genitalia. Vagina is pink and rugated.  Normal discharge. Normal appearing cervix with IUD strings visualized at the os. Uterus is normal in size. No adnexal mass or tenderness. EXTREMITIES: No cyanosis, clubbing, or edema, 2+ distal pulses.    Assessment:    Healthy female exam.      Plan:    Pap smear collected Screening mammogram ordered Patient with expired IUD and desired removal with re-insertion [facility] PROCEDURE NOTE  IUD Removal and Insertion Patient identified, informed consent performed, consent signed.  Patient was in the dorsal lithotomy position, normal external genitalia was noted.  A speculum was placed in the patient's vagina, normal discharge was noted, no lesions. The cervix was visualized, no lesions, no abnormal discharge.  The strings of the IUD were grasped and pulled using ring forceps. The IUD  was removed in its entirety. The cervix was cleaned with Betadine x 2.  Grasped anteriorly with a single tooth tenaculum.  Uterus sounded to 8 cm.  Mirena IUD placed per manufacturer's recommendations.  Strings trimmed to 3 cm. Tenaculum was removed, good hemostasis noted.  Patient tolerated procedure well.   Patient given post procedure instructions and Mirena care card with expiration date.  Patient is asked to check IUD strings periodically and follow up in 4-6 weeks for IUD check.     See After Visit Summary for Counseling Recommendations

## 2019-10-26 NOTE — Addendum Note (Signed)
Addended by: Mora Bellman on: 10/26/2019 10:35 AM   Modules accepted: Orders

## 2019-10-26 NOTE — Progress Notes (Signed)
Patient presents for Annual and IUD Removal.Re-Insertion.  LMP: Spottiing only Contraception: Mirena Last pap: 10/21/2017 WNL  Mammogram : Never  Denies any Family Hx of Breast Cancer STD Screening: Full Panel  CC: None

## 2019-10-27 ENCOUNTER — Other Ambulatory Visit: Payer: Self-pay

## 2019-10-27 DIAGNOSIS — Z139 Encounter for screening, unspecified: Secondary | ICD-10-CM

## 2019-10-27 LAB — CERVICOVAGINAL ANCILLARY ONLY
Bacterial Vaginitis (gardnerella): NEGATIVE
Candida Glabrata: NEGATIVE
Candida Vaginitis: NEGATIVE
Chlamydia: NEGATIVE
Comment: NEGATIVE
Comment: NEGATIVE
Comment: NEGATIVE
Comment: NEGATIVE
Comment: NEGATIVE
Comment: NORMAL
Neisseria Gonorrhea: NEGATIVE
Trichomonas: NEGATIVE

## 2019-10-27 LAB — HEPATITIS C ANTIBODY: Hep C Virus Ab: <0.1 s/co ratio (ref 0.0–0.9)

## 2019-10-27 LAB — RPR: RPR Ser Ql: NONREACTIVE

## 2019-10-27 LAB — HIV ANTIBODY (ROUTINE TESTING W REFLEX): HIV Screen 4th Generation wRfx: NONREACTIVE

## 2019-10-27 LAB — HEPATITIS B SURFACE ANTIGEN: Hepatitis B Surface Ag: NEGATIVE

## 2019-10-29 LAB — NOVEL CORONAVIRUS, NAA: SARS-CoV-2, NAA: NOT DETECTED

## 2019-10-30 LAB — CYTOLOGY - PAP
Comment: NEGATIVE
Diagnosis: NEGATIVE
High risk HPV: NEGATIVE

## 2019-11-02 ENCOUNTER — Ambulatory Visit
Admission: RE | Admit: 2019-11-02 | Discharge: 2019-11-02 | Disposition: A | Discharge status: Home / Self Care | Payer: 59 | Source: Ambulatory Visit | Attending: Obstetrics and Gynecology | Admitting: Obstetrics and Gynecology

## 2019-11-02 ENCOUNTER — Other Ambulatory Visit: Payer: Self-pay

## 2019-11-02 DIAGNOSIS — Z1231 Encounter for screening mammogram for malignant neoplasm of breast: Secondary | ICD-10-CM | POA: Diagnosis not present

## 2019-11-02 DIAGNOSIS — Z01419 Encounter for gynecological examination (general) (routine) without abnormal findings: Secondary | ICD-10-CM

## 2019-11-16 ENCOUNTER — Encounter: Payer: 59 | Admitting: Family Medicine

## 2019-11-17 ENCOUNTER — Other Ambulatory Visit: Payer: Self-pay

## 2019-11-18 ENCOUNTER — Encounter: Payer: Self-pay | Admitting: Family Medicine

## 2019-11-18 ENCOUNTER — Ambulatory Visit (INDEPENDENT_AMBULATORY_CARE_PROVIDER_SITE_OTHER): Payer: 59 | Admitting: Family Medicine

## 2019-11-18 ENCOUNTER — Other Ambulatory Visit: Payer: Self-pay | Admitting: Family Medicine

## 2019-11-18 VITALS — BP 126/88 | HR 72 | Temp 97.8°F | Wt 201.0 lb

## 2019-11-18 DIAGNOSIS — R202 Paresthesia of skin: Secondary | ICD-10-CM | POA: Diagnosis not present

## 2019-11-18 DIAGNOSIS — I10 Essential (primary) hypertension: Secondary | ICD-10-CM | POA: Diagnosis not present

## 2019-11-18 DIAGNOSIS — Z13220 Encounter for screening for lipoid disorders: Secondary | ICD-10-CM

## 2019-11-18 DIAGNOSIS — Z0000 Encounter for general adult medical examination without abnormal findings: Secondary | ICD-10-CM | POA: Diagnosis not present

## 2019-11-18 DIAGNOSIS — R002 Palpitations: Secondary | ICD-10-CM | POA: Diagnosis not present

## 2019-11-18 DIAGNOSIS — R600 Localized edema: Secondary | ICD-10-CM | POA: Diagnosis not present

## 2019-11-18 DIAGNOSIS — H6121 Impacted cerumen, right ear: Secondary | ICD-10-CM | POA: Diagnosis not present

## 2019-11-18 LAB — BRAIN NATRIURETIC PEPTIDE: Pro B Natriuretic peptide (BNP): 15 pg/mL (ref 0.0–100.0)

## 2019-11-18 LAB — LIPID PANEL
Cholesterol: 185 mg/dL (ref 0–200)
HDL: 51.3 mg/dL (ref >39.00)
LDL Cholesterol: 109 mg/dL — ABNORMAL HIGH (ref 0–99)
NonHDL: 133.35
Total CHOL/HDL Ratio: 4
Triglycerides: 123 mg/dL (ref 0.0–149.0)
VLDL: 24.6 mg/dL (ref 0.0–40.0)

## 2019-11-18 LAB — FOLATE: Folate: 14.6 ng/mL (ref >5.9)

## 2019-11-18 LAB — BASIC METABOLIC PANEL
BUN: 18 mg/dL (ref 6–23)
CO2: 28 mEq/L (ref 19–32)
Calcium: 9.3 mg/dL (ref 8.4–10.5)
Chloride: 103 mEq/L (ref 96–112)
Creatinine, Ser: 0.96 mg/dL (ref 0.40–1.20)
GFR: 77.58 mL/min (ref >60.00)
Glucose, Bld: 101 mg/dL — ABNORMAL HIGH (ref 70–99)
Potassium: 3.6 mEq/L (ref 3.5–5.1)
Sodium: 139 mEq/L (ref 135–145)

## 2019-11-18 LAB — CBC WITH DIFFERENTIAL/PLATELET
Basophils Absolute: 0 10*3/uL (ref 0.0–0.1)
Basophils Relative: 0.5 % (ref 0.0–3.0)
Eosinophils Absolute: 0.2 10*3/uL (ref 0.0–0.7)
Eosinophils Relative: 4.7 % (ref 0.0–5.0)
HCT: 39.2 % (ref 36.0–46.0)
Hemoglobin: 13.4 g/dL (ref 12.0–15.0)
Lymphocytes Relative: 46.8 % — ABNORMAL HIGH (ref 12.0–46.0)
Lymphs Abs: 2 10*3/uL (ref 0.7–4.0)
MCHC: 34.3 g/dL (ref 30.0–36.0)
MCV: 91.7 fl (ref 78.0–100.0)
Monocytes Absolute: 0.3 10*3/uL (ref 0.1–1.0)
Monocytes Relative: 7.5 % (ref 3.0–12.0)
Neutro Abs: 1.8 10*3/uL (ref 1.4–7.7)
Neutrophils Relative %: 40.5 % — ABNORMAL LOW (ref 43.0–77.0)
Platelets: 188 10*3/uL (ref 150.0–400.0)
RBC: 4.27 Mil/uL (ref 3.87–5.11)
RDW: 12.8 % (ref 11.5–15.5)
WBC: 4.4 10*3/uL (ref 4.0–10.5)

## 2019-11-18 LAB — VITAMIN B12: Vitamin B-12: 501 pg/mL (ref 211–911)

## 2019-11-18 LAB — TSH: TSH: 1.14 u[IU]/mL (ref 0.35–4.50)

## 2019-11-18 LAB — T4, FREE: Free T4: 1.03 ng/dL (ref 0.60–1.60)

## 2019-11-18 LAB — HEMOGLOBIN A1C: Hgb A1c MFr Bld: 5.3 % (ref 4.6–6.5)

## 2019-11-18 LAB — VITAMIN D 25 HYDROXY (VIT D DEFICIENCY, FRACTURES): VITD: 12.97 ng/mL — ABNORMAL LOW (ref 30.00–100.00)

## 2019-11-18 MED ORDER — VITAMIN D (ERGOCALCIFEROL) 1.25 MG (50000 UNIT) PO CAPS: 50000.0000 [IU] | ORAL_CAPSULE | ORAL | 0 refills | Status: DC

## 2019-11-18 MED FILL — VIT D2 1.25 MG (50,000 UNIT: 1.25 MG | 84 days supply | Qty: 12 | Fill #0

## 2019-11-18 NOTE — Patient Instructions (Addendum)
You can try taking vitamin B6 200 mg daily for your carpal tunnel pain.  You can also try Tiger Balm topical ointment.  Both can be found over-the-counter at your local drugstore. Preventive Care 20-40 Years Old, Female Preventive care refers to visits with your health care provider and lifestyle choices that can promote health and wellness. This includes:  A yearly physical exam. This may also be called an annual well check.  Regular dental visits and eye exams.  Immunizations.  Screening for certain conditions.  Healthy lifestyle choices, such as eating a healthy diet, getting regular exercise, not using drugs or products that contain nicotine and tobacco, and limiting alcohol use. What can I expect for my preventive care visit? Physical exam Your health care provider will check your:  Height and weight. This may be used to calculate body mass index (BMI), which tells if you are at a healthy weight.  Heart rate and blood pressure.  Skin for abnormal spots. Counseling Your health care provider may ask you questions about your:  Alcohol, tobacco, and drug use.  Emotional well-being.  Home and relationship well-being.  Sexual activity.  Eating habits.  Work and work Statistician.  Method of birth control.  Menstrual cycle.  Pregnancy history. What immunizations do I need?  Influenza (flu) vaccine  This is recommended every year. Tetanus, diphtheria, and pertussis (Tdap) vaccine  You may need a Td booster every 10 years. Varicella (chickenpox) vaccine  You may need this if you have not been vaccinated. Zoster (shingles) vaccine  You may need this after age 13. Measles, mumps, and rubella (MMR) vaccine  You may need at least one dose of MMR if you were born in 1957 or later. You may also need a second dose. Pneumococcal conjugate (PCV13) vaccine  You may need this if you have certain conditions and were not previously vaccinated. Pneumococcal polysaccharide  (PPSV23) vaccine  You may need one or two doses if you smoke cigarettes or if you have certain conditions. Meningococcal conjugate (MenACWY) vaccine  You may need this if you have certain conditions. Hepatitis A vaccine  You may need this if you have certain conditions or if you travel or work in places where you may be exposed to hepatitis A. Hepatitis B vaccine  You may need this if you have certain conditions or if you travel or work in places where you may be exposed to hepatitis B. Haemophilus influenzae type b (Hib) vaccine  You may need this if you have certain conditions. Human papillomavirus (HPV) vaccine  If recommended by your health care provider, you may need three doses over 6 months. You may receive vaccines as individual doses or as more than one vaccine together in one shot (combination vaccines). Talk with your health care provider about the risks and benefits of combination vaccines. What tests do I need? Blood tests  Lipid and cholesterol levels. These may be checked every 5 years, or more frequently if you are over 21 years old.  Hepatitis C test.  Hepatitis B test. Screening  Lung cancer screening. You may have this screening every year starting at age 13 if you have a 30-pack-year history of smoking and currently smoke or have quit within the past 15 years.  Colorectal cancer screening. All adults should have this screening starting at age 2 and continuing until age 31. Your health care provider may recommend screening at age 83 if you are at increased risk. You will have tests every 1-10 years, depending on  your results and the type of screening test.  Diabetes screening. This is done by checking your blood sugar (glucose) after you have not eaten for a while (fasting). You may have this done every 1-3 years.  Mammogram. This may be done every 1-2 years. Talk with your health care provider about when you should start having regular mammograms. This may  depend on whether you have a family history of breast cancer.  BRCA-related cancer screening. This may be done if you have a family history of breast, ovarian, tubal, or peritoneal cancers.  Pelvic exam and Pap test. This may be done every 3 years starting at age 25. Starting at age 7, this may be done every 5 years if you have a Pap test in combination with an HPV test. Other tests  Sexually transmitted disease (STD) testing.  Bone density scan. This is done to screen for osteoporosis. You may have this scan if you are at high risk for osteoporosis. Follow these instructions at home: Eating and drinking  Eat a diet that includes fresh fruits and vegetables, whole grains, lean protein, and low-fat dairy.  Take vitamin and mineral supplements as recommended by your health care provider.  Do not drink alcohol if: ? Your health care provider tells you not to drink. ? You are pregnant, may be pregnant, or are planning to become pregnant.  If you drink alcohol: ? Limit how much you have to 0-1 drink a day. ? Be aware of how much alcohol is in your drink. In the [place] one drink equals one 12 oz bottle of beer (355 mL), one 5 oz glass of wine (148 mL), or one 1 oz glass of hard liquor (44 mL). Lifestyle  Take daily care of your teeth and gums.  Stay active. Exercise for at least 30 minutes on 5 or more days each week.  Do not use any products that contain nicotine or tobacco, such as cigarettes, e-cigarettes, and chewing tobacco. If you need help quitting, ask your health care provider.  If you are sexually active, practice safe sex. Use a condom or other form of birth control (contraception) in order to prevent pregnancy and STIs (sexually transmitted infections).  If told by your health care provider, take low-dose aspirin daily starting at age 1. What's next?  Visit your health care provider once a year for a well check visit.  Ask your health care provider how often you should  have your eyes and teeth checked.  Stay up to date on all vaccines. This information is not intended to replace advice given to you by your health care provider. Make sure you discuss any questions you have with your health care provider. Document Released: 12/16/2015 Document Revised: 07/31/2018 Document Reviewed: 07/31/2018 Elsevier Patient Education  [place].  Paresthesia Paresthesia is an abnormal burning or prickling sensation. It is usually felt in the hands, arms, legs, or feet. However, it may occur in any part of the body. Usually, paresthesia is not painful. It may feel like:  Tingling or numbness.  Buzzing.  Itching. Paresthesia may occur without any clear cause, or it may be caused by:  Breathing too quickly (hyperventilation).  Pressure on a nerve.  An underlying medical condition.  Side effects of a medication.  Nutritional deficiencies.  Exposure to toxic chemicals. Most people experience temporary (transient) paresthesia at some time in their lives. For some people, it may be long-lasting (chronic) because of an underlying medical condition. If you have paresthesia that lasts  a long time, you may need to be evaluated by your health care provider. Follow these instructions at home: Alcohol use   Do not drink alcohol if: ? Your health care provider tells you not to drink. ? You are pregnant, may be pregnant, or are planning to become pregnant.  If you drink alcohol: ? Limit how much you use to:  0-1 drink a day for women.  0-2 drinks a day for men. ? Be aware of how much alcohol is in your drink. In the [place] one drink equals one 12 oz bottle of beer (355 mL), one 5 oz glass of wine (148 mL), or one 1 oz glass of hard liquor (44 mL). Nutrition   Eat a healthy diet. This includes: ? Eating foods that are high in fiber, such as fresh fruits and vegetables, whole grains, and beans. ? Limiting foods that are high in fat and processed sugars, such  as fried or sweet foods. General instructions  Take over-the-counter and prescription medicines only as told by your health care provider.  Do not use any products that contain nicotine or tobacco, such as cigarettes and e-cigarettes. These can keep blood from reaching damaged nerves. If you need help quitting, ask your health care provider.  If you have diabetes, work closely with your health care provider to keep your blood sugar under control.  If you have numbness in your feet: ? Check every day for signs of injury or infection. Watch for redness, warmth, and swelling. ? Wear padded socks and comfortable shoes. These help protect your feet.  Keep all follow-up visits as told by your health care provider. This is important. Contact a health care provider if you:  Have paresthesia that gets worse or does not go away.  Have a burning or prickling feeling that gets worse when you walk.  Have pain, cramps, or dizziness.  Develop a rash. Get help right away if you:  Feel weak.  Have trouble walking or moving.  Have problems with speech, understanding, or vision.  Feel confused.  Cannot control your bladder or bowel movements.  Have numbness after an injury.  Develop new weakness in an arm or leg.  Faint. Summary  Paresthesia is an abnormal burning or prickling sensation that is usually felt in the hands, arms, legs, or feet. It may also occur in other parts of the body.  Paresthesia may occur without any clear cause, or it may be caused by breathing too quickly (hyperventilation), pressure on a nerve, an underlying medical condition, side effects of a medication, nutritional deficiencies, or exposure to toxic chemicals.  If you have paresthesia that lasts a long time, you may need to be evaluated by your health care provider. This information is not intended to replace advice given to you by your health care provider. Make sure you discuss any questions you have with your  health care provider. Document Released: 11/09/2002 Document Revised: 12/15/2018 Document Reviewed: 11/28/2017 Elsevier Patient Education  2020 [employer].  Managing Your Hypertension Hypertension is commonly called high blood pressure. This is when the force of your blood pressing against the walls of your arteries is too strong. Arteries are blood vessels that carry blood from your heart throughout your body. Hypertension forces the heart to work harder to pump blood, and may cause the arteries to become narrow or stiff. Having untreated or uncontrolled hypertension can cause heart attack, stroke, kidney disease, and other problems. What are blood pressure readings? A blood pressure reading consists  of a higher number over a lower number. Ideally, your blood pressure should be below 120/80. The first ("top") number is called the systolic pressure. It is a measure of the pressure in your arteries as your heart beats. The second ("bottom") number is called the diastolic pressure. It is a measure of the pressure in your arteries as the heart relaxes. What does my blood pressure reading mean? Blood pressure is classified into four stages. Based on your blood pressure reading, your health care provider may use the following stages to determine what type of treatment you need, if any. Systolic pressure and diastolic pressure are measured in a unit called mm Hg. Normal  Systolic pressure: below 960.  Diastolic pressure: below 80. Elevated  Systolic pressure: 454-098.  Diastolic pressure: below 80. Hypertension stage 1  Systolic pressure: 119-147.  Diastolic pressure: 82-95. Hypertension stage 2  Systolic pressure: 621 or above.  Diastolic pressure: 90 or above. What health risks are associated with hypertension? Managing your hypertension is an important responsibility. Uncontrolled hypertension can lead to:  A heart attack.  A stroke.  A weakened blood vessel (aneurysm).  Heart  failure.  Kidney damage.  Eye damage.  Metabolic syndrome.  Memory and concentration problems. What changes can I make to manage my hypertension? Hypertension can be managed by making lifestyle changes and possibly by taking medicines. Your health care provider will help you make a plan to bring your blood pressure within a normal range. Eating and drinking   Eat a diet that is high in fiber and potassium, and low in salt (sodium), added sugar, and fat. An example eating plan is called the DASH (Dietary Approaches to Stop Hypertension) diet. To eat this way: ? Eat plenty of fresh fruits and vegetables. Try to fill half of your plate at each meal with fruits and vegetables. ? Eat whole grains, such as whole wheat pasta, brown rice, or whole grain bread. Fill about one quarter of your plate with whole grains. ? Eat low-fat diary products. ? Avoid fatty cuts of meat, processed or cured meats, and poultry with skin. Fill about one quarter of your plate with lean proteins such as fish, chicken without skin, beans, eggs, and tofu. ? Avoid premade and processed foods. These tend to be higher in sodium, added sugar, and fat.  Reduce your daily sodium intake. Most people with hypertension should eat less than 1,500 mg of sodium a day.  Limit alcohol intake to no more than 1 drink a day for nonpregnant women and 2 drinks a day for men. One drink equals 12 oz of beer, 5 oz of wine, or 1 oz of hard liquor. Lifestyle  Work with your health care provider to maintain a healthy body weight, or to lose weight. Ask what an ideal weight is for you.  Get at least 30 minutes of exercise that causes your heart to beat faster (aerobic exercise) most days of the week. Activities may include walking, swimming, or biking.  Include exercise to strengthen your muscles (resistance exercise), such as weight lifting, as part of your weekly exercise routine. Try to do these types of exercises for 30 minutes at least  3 days a week.  Do not use any products that contain nicotine or tobacco, such as cigarettes and e-cigarettes. If you need help quitting, ask your health care provider.  Control any long-term (chronic) conditions you have, such as high cholesterol or diabetes. Monitoring  Monitor your blood pressure at home as told by your  health care provider. Your personal target blood pressure may vary depending on your medical conditions, your age, and other factors.  Have your blood pressure checked regularly, as often as told by your health care provider. Working with your health care provider  Review all the medicines you take with your health care provider because there may be side effects or interactions.  Talk with your health care provider about your diet, exercise habits, and other lifestyle factors that may be contributing to hypertension.  Visit your health care provider regularly. Your health care provider can help you create and adjust your plan for managing hypertension. Will I need medicine to control my blood pressure? Your health care provider may prescribe medicine if lifestyle changes are not enough to get your blood pressure under control, and if:  Your systolic blood pressure is 130 or higher.  Your diastolic blood pressure is 80 or higher. Take medicines only as told by your health care provider. Follow the directions carefully. Blood pressure medicines must be taken as prescribed. The medicine does not work as well when you skip doses. Skipping doses also puts you at risk for problems. Contact a health care provider if:  You think you are having a reaction to medicines you have taken.  You have repeated (recurrent) headaches.  You feel dizzy.  You have swelling in your ankles.  You have trouble with your vision. Get help right away if:  You develop a severe headache or confusion.  You have unusual weakness or numbness, or you feel faint.  You have severe pain in your  chest or abdomen.  You vomit repeatedly.  You have trouble breathing. Summary  Hypertension is when the force of blood pumping through your arteries is too strong. If this condition is not controlled, it may put you at risk for serious complications.  Your personal target blood pressure may vary depending on your medical conditions, your age, and other factors. For most people, a normal blood pressure is less than 120/80.  Hypertension is managed by lifestyle changes, medicines, or both. Lifestyle changes include weight loss, eating a healthy, low-sodium diet, exercising more, and limiting alcohol. This information is not intended to replace advice given to you by your health care provider. Make sure you discuss any questions you have with your health care provider. Document Released: 08/13/2012 Document Revised: 03/13/2019 Document Reviewed: 10/17/2016 Elsevier Patient Education  2020 [place], Adult The ears produce a substance called earwax that helps keep bacteria out of the ear and protects the skin in the ear canal. Occasionally, earwax can build up in the ear and cause discomfort or hearing loss. What increases the risk? This condition is more likely to develop in people who:  Are female.  Are elderly.  Naturally produce more earwax.  Clean their ears often with cotton swabs.  Use earplugs often.  Use in-ear headphones often.  Wear hearing aids.  Have narrow ear canals.  Have earwax that is overly thick or sticky.  Have eczema.  Are dehydrated.  Have excess hair in the ear canal. What are the signs or symptoms? Symptoms of this condition include:  Reduced or muffled hearing.  A feeling of fullness in the ear or feeling that the ear is plugged.  Fluid coming from the ear.  Ear pain.  Ear itch.  Ringing in the ear.  Coughing.  An obvious piece of earwax that can be seen inside the ear canal. How is this diagnosed? This condition  may be diagnosed based on:  Your symptoms.  Your medical history.  An ear exam. During the exam, your health care provider will look into your ear with an instrument called an otoscope. You may have tests, including a hearing test. How is this treated? This condition may be treated by:  Using ear drops to soften the earwax.  Having the earwax removed by a health care provider. The health care provider may: ? Flush the ear with water. ? Use an instrument that has a loop on the end (curette). ? Use a suction device.  Surgery to remove the wax buildup. This may be done in severe cases. Follow these instructions at home:   Take over-the-counter and prescription medicines only as told by your health care provider.  Do not put any objects, including cotton swabs, into your ear. You can clean the opening of your ear canal with a washcloth or facial tissue.  Follow instructions from your health care provider about cleaning your ears. Do not over-clean your ears.  Drink enough fluid to keep your urine clear or pale yellow. This will help to thin the earwax.  Keep all follow-up visits as told by your health care provider. If earwax builds up in your ears often or if you use hearing aids, consider seeing your health care provider for routine, preventive ear cleanings. Ask your health care provider how often you should schedule your cleanings.  If you have hearing aids, clean them according to instructions from the manufacturer and your health care provider. Contact a health care provider if:  You have ear pain.  You develop a fever.  You have blood, pus, or other fluid coming from your ear.  You have hearing loss.  You have ringing in your ears that does not go away.  Your symptoms do not improve with treatment.  You feel like the room is spinning (vertigo). Summary  Earwax can build up in the ear and cause discomfort or hearing loss.  The most common symptoms of this  condition include reduced or muffled hearing and a feeling of fullness in the ear or feeling that the ear is plugged.  This condition may be diagnosed based on your symptoms, your medical history, and an ear exam.  This condition may be treated by using ear drops to soften the earwax or by having the earwax removed by a health care provider.  Do not put any objects, including cotton swabs, into your ear. You can clean the opening of your ear canal with a washcloth or facial tissue. This information is not intended to replace advice given to you by your health care provider. Make sure you discuss any questions you have with your health care provider. Document Released: 12/27/2004 Document Revised: 11/01/2017 Document Reviewed: 01/30/2017 Elsevier Patient Education  2020 [place] tunnel syndrome is a condition that causes pain in your hand and arm. The carpal tunnel is a narrow area located on the palm side of your wrist. Repeated wrist motion or certain diseases may cause swelling within the tunnel. This swelling pinches the main nerve in the wrist (median nerve). What are the causes? This condition may be caused by:  Repeated wrist motions.  Wrist injuries.  Arthritis.  A cyst or tumor in the carpal tunnel.  Fluid buildup during pregnancy. Sometimes the cause of this condition is not known. What increases the risk? The following factors may make you more likely to develop this condition:  Having  a job, such as being a Research scientist (life sciences), that requires you to repeatedly move your wrist in the same motion.  Being a woman.  Having certain conditions, such as: ? Diabetes. ? Obesity. ? An underactive thyroid (hypothyroidism). ? Kidney failure. What are the signs or symptoms? Symptoms of this condition include:  A tingling feeling in your fingers, especially in your thumb, index, and middle fingers.  Tingling or numbness in your  hand.  An aching feeling in your entire arm, especially when your wrist and elbow are bent for a long time.  Wrist pain that goes up your arm to your shoulder.  Pain that goes down into your palm or fingers.  A weak feeling in your hands. You may have trouble grabbing and holding items. Your symptoms may feel worse during the night. How is this diagnosed? This condition is diagnosed with a medical history and physical exam. You may also have tests, including:  Electromyogram (EMG). This test measures electrical signals sent by your nerves into the muscles.  Nerve conduction study. This test measures how well electrical signals pass through your nerves.  Imaging tests, such as X-rays, ultrasound, and MRI. These tests check for possible causes of your condition. How is this treated? This condition may be treated with:  Lifestyle changes. It is important to stop or change the activity that caused your condition.  Doing exercise and activities to strengthen your muscles and bones (physical therapy).  Learning how to use your hand again after diagnosis (occupational therapy).  Medicines for pain and inflammation. This may include medicine that is injected into your wrist.  A wrist splint.  Surgery. Follow these instructions at home: If you have a splint:  Wear the splint as told by your health care provider. Remove it only as told by your health care provider.  Loosen the splint if your fingers tingle, become numb, or turn cold and blue.  Keep the splint clean.  If the splint is not waterproof: ? Do not let it get wet. ? Cover it with a watertight covering when you take a bath or shower. Managing pain, stiffness, and swelling   If directed, put ice on the painful area: ? If you have a removable splint, remove it as told by your health care provider. ? Put ice in a plastic bag. ? Place a towel between your skin and the bag. ? Leave the ice on for 20 minutes, 2-3 times per  day. General instructions  Take over-the-counter and prescription medicines only as told by your health care provider.  Rest your wrist from any activity that may be causing your pain. If your condition is work related, talk with your employer about changes that can be made, such as getting a wrist pad to use while typing.  Do any exercises as told by your health care provider, physical therapist, or occupational therapist.  Keep all follow-up visits as told by your health care provider. This is important. Contact a health care provider if:  You have new symptoms.  Your pain is not controlled with medicines.  Your symptoms get worse. Get help right away if:  You have severe numbness or tingling in your wrist or hand. Summary  Carpal tunnel syndrome is a condition that causes pain in your hand and arm.  It is usually caused by repeated wrist motions.  Lifestyle changes and medicines are used to treat carpal tunnel syndrome. Surgery may be recommended.  Follow your health care provider's instructions about  wearing a splint, resting from activity, keeping follow-up visits, and calling for help. This information is not intended to replace advice given to you by your health care provider. Make sure you discuss any questions you have with your health care provider. Document Released: 11/16/2000 Document Revised: 03/28/2018 Document Reviewed: 03/28/2018 Elsevier Patient Education  2020 [place] is a normal reaction to life events. Stress is what you feel when life demands more than you are used to, or more than you think you can handle. Some stress can be useful, such as studying for a test or meeting a deadline at work. Stress that occurs too often or for too long can cause problems. It can affect your emotional health and interfere with relationships and normal daily activities. Too much stress can weaken your body's defense system (immune system) and increase your  risk for physical illness. If you already have a medical problem, stress can make it worse. What are the causes? All sorts of life events can cause stress. An event that causes stress for one person may not be stressful for another person. Major life events, whether positive or negative, commonly cause stress. Examples include:  Losing a job or starting a new job.  Losing a loved one.  Moving to a new town or home.  Getting married or divorced.  Having a baby.  Injury or illness. Less obvious life events can also cause stress, especially if they occur day after day or in combination with each other. Examples include:  Working long hours.  Driving in traffic.  Caring for children.  Being in debt.  Being in a difficult relationship. What are the signs or symptoms? Stress can cause emotional symptoms, including:  Anxiety. This is feeling worried, afraid, on edge, overwhelmed, or out of control.  Anger, including irritation or impatience.  Depression. This is feeling sad, down, helpless, or guilty.  Trouble focusing, remembering, or making decisions. Stress can cause physical symptoms, including:  Aches and pains. These may affect your head, neck, back, stomach, or other areas of your body.  Tight muscles or a clenched jaw.  Low energy.  Trouble sleeping. Stress can cause unhealthy behaviors, including:  Eating to feel better (overeating) or skipping meals.  Working too much or putting off tasks.  Smoking, drinking alcohol, or using drugs to feel better. How is this diagnosed? Stress is diagnosed through an assessment by your health care provider. He or she may diagnose this condition based on:  Your symptoms and any stressful life events.  Your medical history.  Tests to rule out other causes of your symptoms. Depending on your condition, your health care provider may refer you to a specialist for further evaluation. How is this treated?  Stress management  techniques are the recommended treatment for stress. Medicine is not typically recommended for the treatment of stress. Techniques to reduce your reaction to stressful life events include:  Stress identification. Monitor yourself for symptoms of stress and identify what causes stress for you. These skills may help you to avoid or prepare for stressful events.  Time management. Set your priorities, keep a calendar of events, and learn to say "no." Taking these actions can help you avoid making too many commitments. Techniques for coping with stress include:  Rethinking the problem. Try to think realistically about stressful events rather than ignoring them or overreacting. Try to find the positives in a stressful situation rather than focusing on the negatives.  Exercise. Physical exercise can release both  physical and emotional tension. The key is to find a form of exercise that you enjoy and do it regularly.  Relaxation techniques. These relax the body and mind. The key is to find one or more that you enjoy and use the technique(s) regularly. Examples include: ? Meditation, deep breathing, or progressive relaxation techniques. ? Yoga or tai chi. ? Biofeedback, mindfulness techniques, or journaling. ? Listening to music, being out in nature, or participating in other hobbies.  Practicing a healthy lifestyle. Eat a balanced diet, drink plenty of water, limit or avoid caffeine, and get plenty of sleep.  Having a strong support network. Spend time with family, friends, or other people you enjoy being around. Express your feelings and talk things over with someone you trust. Counseling or talk therapy with a mental health professional may be helpful if you are having trouble managing stress on your own. Follow these instructions at home: Lifestyle   Avoid drugs.  Do not use any products that contain nicotine or tobacco, such as cigarettes and e-cigarettes. If you need help quitting, ask your  health care provider.  Limit alcohol intake to no more than 1 drink a day for nonpregnant women and 2 drinks a day for men. One drink equals 12 oz of beer, 5 oz of wine, or 1 oz of hard liquor.  Do not use alcohol or drugs to relax.  Eat a balanced diet that includes fresh fruits and vegetables, whole grains, lean meats, fish, eggs, and beans, and low-fat dairy. Avoid processed foods and foods high in added fat, sugar, and salt.  Exercise at least 30 minutes on 5 or more days each week.  Get 7-8 hours of sleep each night. General instructions   Practice stress management techniques as discussed with your health care provider.  Drink enough fluid to keep your urine clear or pale yellow.  Take over-the-counter and prescription medicines only as told by your health care provider.  Keep all follow-up visits as told by your health care provider. This is important. Contact a health care provider if:  Your symptoms get worse.  You have new symptoms.  You feel overwhelmed by your problems and can no longer manage them on your own. Get help right away if:  You have thoughts of hurting yourself or others. If you ever feel like you may hurt yourself or others, or have thoughts about taking your own life, get help right away. You can go to your nearest emergency department or call:  Your local emergency services (911 in the [place]  A suicide crisis helpline, such as the [place] at 971-862-3525. This is open 24 hours a day. Summary  Stress is a normal reaction to life events. It can cause problems if it happens too often or for too long.  Practicing stress management techniques is the best way to treat stress.  Counseling or talk therapy with a mental health professional may be helpful if you are having trouble managing stress on your own. This information is not intended to replace advice given to you by your health care provider. Make sure you discuss  any questions you have with your health care provider. Document Released: 05/15/2001 Document Revised: 11/01/2017 Document Reviewed: 01/09/2017 Elsevier Patient Education  [place]. Panic Attack A panic attack is a sudden episode of severe anxiety, fear, or discomfort that causes physical and emotional symptoms. The attack may be in response to something frightening, or it may occur for no known reason. Symptoms  of a panic attack can be similar to symptoms of a heart attack or stroke. It is important to see your health care provider when you have a panic attack so that these conditions can be ruled out. A panic attack is a symptom of another condition. Most panic attacks go away with treatment of the underlying problem. If you have panic attacks often, you may have a condition called panic disorder. What are the causes? A panic attack may be caused by:  An extreme, life-threatening situation, such as a war or natural disaster.  An anxiety disorder, such as post-traumatic stress disorder.  Depression.  Certain medical conditions, including heart problems, neurological conditions, and infections.  Certain over-the-counter and prescription medicines.  Illegal drugs that increase heart rate and blood pressure, such as methamphetamine.  Alcohol.  Supplements that increase anxiety.  Panic disorder. What increases the risk? You are more likely to develop this condition if:  You have an anxiety disorder.  You have another mental health condition.  You take certain medicines.  You use alcohol, illegal drugs, or other substances.  You are under extreme stress.  A life event is causing increased feelings of anxiety and depression. What are the signs or symptoms? A panic attack starts suddenly, usually lasts about 20 minutes, and occurs with one or more of the following:  A pounding heart.  A feeling that your heart is beating irregularly or faster than normal  (palpitations).  Sweating.  Trembling or shaking.  Shortness of breath or feeling smothered.  Feeling choked.  Chest pain or discomfort.  Nausea or a strange feeling in your stomach.  Dizziness, feeling lightheaded, or feeling like you might faint.  Chills or hot flashes.  Numbness or tingling in your lips, hands, or feet.  Feeling confused, or feeling that you are not yourself.  Fear of losing control or being emotionally unstable.  Fear of dying. How is this diagnosed? A panic attack is diagnosed with an assessment by your health care provider. During the assessment your health care provider will ask questions about:  Your history of anxiety, depression, and panic attacks.  Your medical history.  Whether you drink alcohol, use illegal drugs, take supplements, or take medicines. Be honest about your substance use. Your health care provider may also:  Order blood tests or other kinds of tests to rule out serious medical conditions.  Refer you to a mental health professional for further evaluation. How is this treated? Treatment depends on the cause of the panic attack:  If the cause is a medical problem, your health care provider will either treat that problem or refer you to a specialist.  If the cause is emotional, you may be given anti-anxiety medicines or referred to a counselor. These medicines may reduce how often attacks happen, reduce how severe the attacks are, and lower anxiety.  If the cause is a medicine, your health care provider may tell you to stop the medicine, change your dose, or take a different medicine.  If the cause is a drug, treatment may involve letting the drug wear off and taking medicine to help the drug leave your body or to counteract its effects. Attacks caused by drug abuse may continue even if you stop using the drug. Follow these instructions at home:  Take over-the-counter and prescription medicines only as told by your health care  provider.  If you feel anxious, limit your caffeine intake.  Take good care of your physical and mental health by: ? Eating  a balanced diet that includes plenty of fresh fruits and vegetables, whole grains, lean meats, and low-fat dairy. ? Getting plenty of rest. Try to get 7-8 hours of uninterrupted sleep each night. ? Exercising regularly. Try to get 30 minutes of physical activity at least 5 days a week. ? Not smoking. Talk to your health care provider if you need help quitting. ? Limiting alcohol intake to no more than 1 drink a day for nonpregnant women and 2 drinks a day for men. One drink equals 12 oz of beer, 5 oz of wine, or 1 oz of hard liquor.  Keep all follow-up visits as told by your health care provider. This is important. Panic attacks may have underlying physical or emotional problems that take time to accurately diagnose. Contact a health care provider if:  Your symptoms do not improve, or they get worse.  You are not able to take your medicine as prescribed because of side effects. Get help right away if:  You have serious thoughts about hurting yourself or others.  You have symptoms of a panic attack. Do not drive yourself to the hospital. Have someone else drive you or call an ambulance. If you ever feel like you may hurt yourself or others, or you have thoughts about taking your own life, get help right away. You can go to your nearest emergency department or call:  Your local emergency services (911 in the [place]  A suicide crisis helpline, such as the [place] at 270-637-8891. This is open 24 hours a day. Summary  A panic attack is a sign of a serious health or mental health condition. Get help right away. Do not drive yourself to the hospital. Have someone else drive you or call an ambulance.  Always see a health care provider to have the reasons for the panic attack correctly diagnosed.  If your panic attack was caused by a  physical problem, follow your health care provider's suggestions for medicine, referral to a specialist, and lifestyle changes.  If your panic attack was caused by an emotional problem, follow through with counseling from a qualified mental health specialist.  If you feel like you may hurt yourself or others, call 911 and get help right away. This information is not intended to replace advice given to you by your health care provider. Make sure you discuss any questions you have with your health care provider. Document Released: 11/19/2005 Document Revised: 11/01/2017 Document Reviewed: 12/28/2016 Elsevier Patient Education  2020 [employer].

## 2019-11-18 NOTE — Progress Notes (Signed)
Subjective:     Ashley Summers is a 38 y.o. female and is here for a comprehensive physical exam. The patient reports problems - Palpitations, paresthesias, LE edema.  Pt noticed LE edema that is worse at night.  Swelling comes and goes.  Pt does a lot of standing at work.  Pt is not eating much salt and cooking her meals at home.  Pt also notes tingling in hands and feet more at night.  Pt has a history of carpal tunnel bilaterally.  Wearing wrist splints but still having pain.  Will take Aleve or ibuprofen.  Pt also notes feeling of her heart "dropping" can happen at any time.  Does not last long.  Pt denies chest pain, changes in vision, dizziness headaches, numbness/tingling in upper extremity.  Drinking about 1 L of water per day.  May have a large cup of coffee in the morning.  Denies increased anxiety.  States panic has become less.  Has been able to drive in her son's car without feeling anxious/claustrophobic.  Had a few counseling sessions with [facility].  Plans to schedule follow-up in the future.  Has not been taking Paxil 10 mg as caused headaches.  Social History   Socioeconomic History  . Marital status: Single    Spouse name: Not on file  . Number of children: Not on file  . Years of education: Not on file  . Highest education level: Not on file  Occupational History  . Not on file  Tobacco Use  . Smoking status: Never Smoker  . Smokeless tobacco: Never Used  Substance and Sexual Activity  . Alcohol use: Yes    Alcohol/week: 0.0 standard drinks    Comment: occasional  . Drug use: No  . Sexual activity: Yes    Partners: Male    Birth control/protection: I.U.D.  Other Topics Concern  . Not on file  Social History Narrative   Work or School: Chartered certified accountant, [employer]      Home Situation: lives with 3 kids (17,15, 9)      Spiritual Beliefs: none      Lifestyle: diet is so so; has been trying to exercising            Social Determinants of Health   Financial  Resource Strain:   . Difficulty of Paying Living Expenses: Not on file  Food Insecurity:   . Worried About Charity fundraiser in the Last Year: Not on file  . Ran Out of Food in the Last Year: Not on file  Transportation Needs:   . Lack of Transportation (Medical): Not on file  . Lack of Transportation (Non-Medical): Not on file  Physical Activity:   . Days of Exercise per Week: Not on file  . Minutes of Exercise per Session: Not on file  Stress:   . Feeling of Stress : Not on file  Social Connections:   . Frequency of Communication with Friends and Family: Not on file  . Frequency of Social Gatherings with Friends and Family: Not on file  . Attends Religious Services: Not on file  . Active Member of Clubs or Organizations: Not on file  . Attends Archivist Meetings: Not on file  . Marital Status: Not on file  Intimate Partner Violence:   . Fear of Current or Ex-Partner: Not on file  . Emotionally Abused: Not on file  . Physically Abused: Not on file  . Sexually Abused: Not on file   Health  Maintenance  Topic Date Due  . MAMMOGRAM  11/01/2020  . PAP SMEAR-Modifier  10/25/2022  . TETANUS/TDAP  10/11/2024  . INFLUENZA VACCINE  Completed  . HIV Screening  Completed    The following portions of the patient's history were reviewed and updated as appropriate: allergies, current medications, past family history, past medical history, past social history, past surgical history and problem list.  Review of Systems Pertinent items noted in HPI and remainder of comprehensive ROS otherwise negative.   Objective:    BP 126/88 (BP Location: Left Arm, Patient Position: Sitting, Cuff Size: Large)   Pulse 72   Temp 97.8 F (36.6 C) (Temporal)   Wt 201 lb (91.2 kg)   LMP 11/11/2019 (Exact Date)   SpO2 98%   BMI 34.50 kg/m  General appearance: alert, cooperative, appears stated age and no distress Head: Normocephalic, without obvious abnormality, atraumatic Eyes:  conjunctivae/corneas clear. PERRL, EOM's intact. Fundi benign. Ears: External ears normal bilaterally.  Left TM normal.  Right canal occluded with cerumen.  Right TM normal after irrigation. Nose: Nares normal. Septum midline. Mucosa normal. No drainage or sinus tenderness. Throat: lips, mucosa, and tongue normal; teeth and gums normal Neck: no adenopathy, no carotid bruit, no JVD, supple, symmetrical, trachea midline and thyroid not enlarged, symmetric, no tenderness/mass/nodules Lungs: clear to auscultation bilaterally Heart: regular rate and rhythm, S1, S2 normal, no murmur, click, rub or gallop Abdomen: soft, non-tender; bowel sounds normal; no masses,  no organomegaly Extremities: extremities normal, atraumatic, no cyanosis or edema Pulses: 2+ and symmetric Skin: Skin color, texture, turgor normal. No rashes or lesions Lymph nodes: Cervical, supraclavicular, and axillary nodes normal. Neurologic: Alert and oriented X 3, normal strength and tone. Normal symmetric reflexes. Normal coordination and gait     Assessment:    Healthy female exam with recent palpitations and paresthesias in bilateral hands and feet.     Plan:     Anticipatory guidance given including wearing seatbelts, smoke detectors in the home, increasing physical activity, increasing p.o. intake of water and vegetables. -We will obtain labs as patient is fasting -Pap done at OB/Gyn.  Due 10/25/2022. -Given info for mammogram. -Influenza vaccine up-to-date -Given handout -PHQ-9 score 0 -GAD-7 score 0 -Next CPE in 1 year See After Visit Summary for Counseling Recommendations    Essential hypertension -Controlled -Continue hydrochlorothiazide 25 mg -We will check BMP -Continue lifestyle modifications -Given handout - Plan: Basic Metabolic Panel  Paresthesia -Discussed various causes including vitamin deficiency, nerve compression, DM -Foot exam done this visit and normal. -We will give handout -Will obtain  labs - Plan: TSH, T4, Free, Hemoglobin A1c, Vitamin B12, Vitamin D, 25-hydroxy, Folate  Palpitations  -Discussed various causes including thyroid dysfunction, increased caffeine intake, anxiety/stress -Patient to cut down on caffeine intake -Given handout -We will obtain labs -For continued symptoms will obtain Holter or event monitor - Plan: CBC with Differential/Platelet, TSH, T4, Free  Screening for cholesterol level  - Plan: Lipid Panel  Impacted cerumen of right ear -Consent obtained.  Right ear irrigated.  TM normal. -given handout  Follow-up as needed  Grier Mitts, MD

## 2019-11-20 ENCOUNTER — Other Ambulatory Visit: Payer: Self-pay | Admitting: Family Medicine

## 2019-11-23 ENCOUNTER — Ambulatory Visit: Payer: 59 | Admitting: Obstetrics and Gynecology

## 2019-11-23 ENCOUNTER — Other Ambulatory Visit: Payer: Self-pay

## 2019-11-23 ENCOUNTER — Encounter: Payer: Self-pay | Admitting: Obstetrics and Gynecology

## 2019-11-23 VITALS — BP 132/86 | HR 55 | Wt 204.0 lb

## 2019-11-23 DIAGNOSIS — Z30431 Encounter for routine checking of intrauterine contraceptive device: Secondary | ICD-10-CM | POA: Diagnosis not present

## 2019-11-23 MED ORDER — PHENTERMINE HCL 15 MG PO CAPS: 15.0000 mg | ORAL_CAPSULE | ORAL | 3 refills | Status: DC

## 2019-11-23 MED FILL — PHENTERMINE 15 MG CAPSULE: 15 | 30 days supply | Qty: 30 | Fill #0

## 2019-11-23 NOTE — Progress Notes (Signed)
38 yo here for IUD check s/p placement 10/26/2019. Patient reports feeling well. She denies any pelvic pain, or persistent abnormal bleeding. Patient has not been sexually active  Past Medical History:  Diagnosis Date  . Allergy   . Hypertension    Past Surgical History:  Procedure Laterality Date  . BREAST SURGERY     Reduction   . Hammertoe correction     Family History  Problem Relation Age of Onset  . Hypertension Mother   . Stroke Mother    Social History   Tobacco Use  . Smoking status: Never Smoker  . Smokeless tobacco: Never Used  Substance Use Topics  . Alcohol use: Yes    Alcohol/week: 0.0 standard drinks    Comment: occasional  . Drug use: No   ROS See pertinent in HPI  Blood pressure 132/86, pulse (!) 55, weight 204 lb (92.5 kg), last menstrual period 11/11/2019.  GENERAL: Well-developed, well-nourished female in no acute distress.  ABDOMEN: Soft, nontender, nondistended. No organomegaly. PELVIC: Normal external female genitalia. Vagina is pink and rugated.  Normal discharge. Normal appearing cervix with IUD strings visualized extending 3 cm from os. Uterus is normal in size.  No adnexal mass or tenderness. EXTREMITIES: No cyanosis, clubbing, or edema, 2+ distal pulses.  A/P 38 yo here for IUD check - IUD appears to be in the appropriate location - Patient with normal pap smear and mammogram 10/2019 - RTC prn

## 2019-11-23 NOTE — Progress Notes (Signed)
Pt is in office for IUD check. Pt states she is doing well with IUD.

## 2020-01-15 ENCOUNTER — Other Ambulatory Visit: Payer: Self-pay | Admitting: Family Medicine

## 2020-01-15 MED FILL — IBUPROFEN 600 MG TABLET: 600 | 7 days supply | Qty: 30 | Fill #1

## 2020-01-15 MED FILL — PHENTERMINE 15 MG CAPSULE: 15 | 30 days supply | Qty: 30 | Fill #1

## 2020-01-18 MED FILL — HYDROCHLOROTHIAZIDE 25 MG T: 25 | 90 days supply | Qty: 90 | Fill #0

## 2020-01-25 ENCOUNTER — Other Ambulatory Visit: Payer: Self-pay

## 2020-01-25 MED ORDER — METRONIDAZOLE 0.75 % VA GEL: 1.0000 | Freq: Every day | VAGINAL | 0 refills | Status: AC

## 2020-01-25 MED FILL — metroNIDAZOLE 0.75 % GEL: 0.75 | 5 days supply | Qty: 70 | Fill #0

## 2020-01-25 NOTE — Progress Notes (Signed)
Rx metrogel for BV symptoms per protocol, pt made aware.

## 2020-02-01 ENCOUNTER — Ambulatory Visit: Payer: 59 | Admitting: Obstetrics and Gynecology

## 2020-02-23 MED FILL — PHENTERMINE 15 MG CAPSULE: 15 | 30 days supply | Qty: 30 | Fill #2

## 2020-04-26 MED FILL — PHENTERMINE 15 MG CAPSULE: 15 | 30 days supply | Qty: 30 | Fill #3

## 2020-06-03 MED FILL — NYSTATIN 100,000 UNIT/GM CR: 100000 | 14 days supply | Qty: 30 | Fill #1

## 2020-06-03 MED FILL — HYDROCHLOROTHIAZIDE 25 MG T: 25 | 90 days supply | Qty: 90 | Fill #1

## 2020-06-17 ENCOUNTER — Ambulatory Visit: Payer: No Typology Code available for payment source | Attending: Nurse Practitioner | Admitting: Nurse Practitioner

## 2020-06-17 ENCOUNTER — Other Ambulatory Visit: Payer: Self-pay

## 2020-06-17 ENCOUNTER — Encounter: Payer: Self-pay | Admitting: Nurse Practitioner

## 2020-06-17 VITALS — Ht 64.0 in | Wt 192.0 lb

## 2020-06-17 DIAGNOSIS — Z7689 Persons encountering health services in other specified circumstances: Secondary | ICD-10-CM | POA: Diagnosis not present

## 2020-06-17 DIAGNOSIS — F419 Anxiety disorder, unspecified: Secondary | ICD-10-CM | POA: Diagnosis not present

## 2020-06-17 DIAGNOSIS — I10 Essential (primary) hypertension: Secondary | ICD-10-CM | POA: Diagnosis not present

## 2020-06-17 DIAGNOSIS — E669 Obesity, unspecified: Secondary | ICD-10-CM | POA: Diagnosis not present

## 2020-06-17 MED ORDER — SAXENDA 18 MG/3ML ~~LOC~~ SOPN: 0.2500 mg | PEN_INJECTOR | SUBCUTANEOUS | 0 refills | Status: DC

## 2020-06-17 MED ORDER — HYDROXYZINE HCL 10 MG PO TABS: 10.0000 mg | ORAL_TABLET | Freq: Four times a day (QID) | ORAL | 3 refills | Status: DC | PRN

## 2020-06-17 MED FILL — hydrOXYzine HCL 10 MG TABS: 10 | 15 days supply | Qty: 60 | Fill #0

## 2020-06-17 NOTE — Progress Notes (Signed)
Virtual Visit via Telephone Note Due to national recommendations of social distancing due to [place] 19, telehealth visit is felt to be most appropriate for this patient at this time.  I discussed the limitations, risks, security and privacy concerns of performing an evaluation and management service by telephone and the availability of in person appointments. I also discussed with the patient that there may be a patient responsible charge related to this service. The patient expressed understanding and agreed to proceed.    I connected with Ashley Summers on 06/17/20  at   1:50 PM EDT  EDT by telephone and verified that I am speaking with the correct person using two identifiers.   Consent I discussed the limitations, risks, security and privacy concerns of performing an evaluation and management service by telephone and the availability of in person appointments. I also discussed with the patient that there may be a patient responsible charge related to this service. The patient expressed understanding and agreed to proceed.   Location of Patient: Private  residence   Location of Provider: [place] participating in Telemedicine visit: Geryl Rankins FNP-BC [place]    History of Present Illness: Telemedicine visit for: Establish Care  She walks after work 30-40 minutes a day.  Has lost 10lbs through exercise and dietary modifications but having difficulty losing any additional weight. Treatments tried: Phentermine causes her to have insomnia and heart racing. Goal 175-180. She has a history of HTN with most recent blood pressure 124/74. Highest weight 206. Current BMI 32 with co morbidity of HTN.    Anxiety Was previously taking Paxil which gave her headaches. She is not interested in SSRI. Would like something to take as needed.  Associated symptoms: panic attacks, palpitations. PHQ9 score normal.  Depression  screen [facility] 2/9 06/17/2020 11/18/2019 11/10/2018 11/10/2018 11/04/2017  Decreased Interest 0 0 - 0 0  Down, Depressed, Hopeless 0 0 1 1 0  PHQ - 2 Score 0 0 1 1 0  Altered sleeping 0 0 1 - -  Tired, decreased energy 0 0 1 - -  Change in appetite 0 0 1 - -  Feeling bad or failure about yourself  0 0 1 - -  Trouble concentrating 0 0 1 - -  Moving slowly or fidgety/restless 0 0 0 - -  Suicidal thoughts 0 0 0 - -  PHQ-9 Score 0 0 6 - -   GAD 7 : Generalized Anxiety Score 06/17/2020 11/18/2019  Nervous, Anxious, on Edge 2 0  Control/stop worrying 2 0  Worry too much - different things 2 0  Trouble relaxing 0 1  Restless 0 0  Easily annoyed or irritable 3 0  Afraid - awful might happen 0 0  Total GAD 7 Score 9 1    Past Medical History:  Diagnosis Date  . Allergy   . Anxiety   . Hypertension     Past Surgical History:  Procedure Laterality Date  . BREAST SURGERY     Reduction   . Hammertoe correction      Family History  Problem Relation Age of Onset  . Hypertension Mother   . Stroke Mother     Social History   Socioeconomic History  . Marital status: Single    Spouse name: Not on file  . Number of children: Not on file  . Years of education: Not on file  . Highest education level: Not on file  Occupational History  . Not on file  Tobacco Use  . Smoking status: Never Smoker  . Smokeless tobacco: Never Used  Vaping Use  . Vaping Use: Never used  Substance and Sexual Activity  . Alcohol use: Yes    Alcohol/week: 0.0 standard drinks    Comment: occasional  . Drug use: No  . Sexual activity: Yes    Partners: Male    Birth control/protection: I.U.D.  Other Topics Concern  . Not on file  Social History Narrative   Work or School: Chartered certified accountant, [employer], [employer] unit      Home Situation: lives with 3 kids (17,15, 9)      Spiritual Beliefs: none      Lifestyle: diet is so so; has been trying to exercising            Social Determinants of Health   Financial  Resource Strain:   . Difficulty of Paying Living Expenses:   Food Insecurity:   . Worried About Charity fundraiser in the Last Year:   . Arboriculturist in the Last Year:   Transportation Needs:   . Film/video editor (Medical):   Marland Kitchen Lack of Transportation (Non-Medical):   Physical Activity:   . Days of Exercise per Week:   . Minutes of Exercise per Session:   Stress:   . Feeling of Stress :   Social Connections:   . Frequency of Communication with Friends and Family:   . Frequency of Social Gatherings with Friends and Family:   . Attends Religious Services:   . Active Member of Clubs or Organizations:   . Attends Archivist Meetings:   Marland Kitchen Marital Status:      Observations/Objective: Awake, alert and oriented x 3   Review of Systems  Constitutional: Negative for fever, malaise/fatigue and weight loss.  HENT: Negative.  Negative for nosebleeds.   Eyes: Negative.  Negative for blurred vision, double vision and photophobia.  Respiratory: Negative.  Negative for cough and shortness of breath.   Cardiovascular: Negative.  Negative for chest pain, palpitations and leg swelling.  Gastrointestinal: Negative.  Negative for heartburn, nausea and vomiting.  Musculoskeletal: Negative.  Negative for myalgias.  Neurological: Negative.  Negative for dizziness, focal weakness, seizures and headaches.  Psychiatric/Behavioral: Negative for suicidal ideas. The patient is nervous/anxious.     Assessment and Plan: [name] was seen today for establish care.  Diagnoses and all orders for this visit:  Encounter to establish care  Obesity (BMI 30-39.9) -     Liraglutide -Weight Management (SAXENDA) 18 MG/3ML SOPN; Inject 0.04 mLs (0.24 mg total) into the skin once a week.  Anxiety -     hydrOXYzine (ATARAX/VISTARIL) 10 MG tablet; Take 1 tablet (10 mg total) by mouth every 6 (six) hours as needed for anxiety.  Essential hypertension Continue all antihypertensives as prescribed.   Remember to bring in your blood pressure log with you for your follow up appointment.  DASH/Mediterranean Diets are healthier choices for HTN.  Continue HCTZ 25 mg daily as prescribed     Follow Up Instructions Return in about 4 weeks (around 07/15/2020) for weight check/ BP.     I discussed the assessment and treatment plan with the patient. The patient was provided an opportunity to ask questions and all were answered. The patient agreed with the plan and demonstrated an understanding of the instructions.   The patient was advised to call back or seek an in-person evaluation if the symptoms worsen  or if the condition fails to improve as anticipated.  I provided 18 minutes of non-face-to-face time during this encounter including median intraservice time, reviewing previous notes, labs, imaging, medications and explaining diagnosis and management.  Gildardo Pounds, FNP-BC

## 2020-06-22 ENCOUNTER — Telehealth: Payer: Self-pay | Admitting: Family Medicine

## 2020-06-22 DIAGNOSIS — E559 Vitamin D deficiency, unspecified: Secondary | ICD-10-CM

## 2020-06-22 DIAGNOSIS — I10 Essential (primary) hypertension: Secondary | ICD-10-CM

## 2020-06-22 NOTE — Telephone Encounter (Signed)
Pt called in for assistance. Pt says that she is scheduled to have labs drawn at the office. Pt says that she work for [employer] and would like to know if she could have a copy of the orders and have labs completed at work instead?    Also, pt says that she received a Rx for Saxenda and was told by the pharmacy that medication is requiring a PA completed. Pt would like further assistance with this. Pt would also like to know if provider has samples of medication until PA is completed?   CB: [id number]

## 2020-06-23 ENCOUNTER — Encounter: Payer: Self-pay | Admitting: Nurse Practitioner

## 2020-06-23 NOTE — Telephone Encounter (Signed)
Spoke to patient and will put labs in for patient to get done.  Pt. Was informed PCP is not in clinic and will give patient a call regarding samples. Pt. Was informed CMA will look out for PA for the medication.

## 2020-06-23 NOTE — Telephone Encounter (Signed)
Patient is requesting a callback from nurse in regards to the prior authorization completion and the samples of the Saxenda medication. Please advise

## 2020-06-24 ENCOUNTER — Ambulatory Visit: Payer: No Typology Code available for payment source | Attending: Family Medicine

## 2020-06-24 ENCOUNTER — Other Ambulatory Visit: Payer: Self-pay

## 2020-06-24 DIAGNOSIS — Z130 Encounter for screening for diseases of the blood and blood-forming organs and certain disorders involving the immune mechanism: Secondary | ICD-10-CM

## 2020-06-24 DIAGNOSIS — E559 Vitamin D deficiency, unspecified: Secondary | ICD-10-CM

## 2020-06-24 DIAGNOSIS — I10 Essential (primary) hypertension: Secondary | ICD-10-CM

## 2020-06-24 NOTE — Telephone Encounter (Signed)
Attempt to reach patient and informed on PCP advising.  No answer and LVM.

## 2020-06-25 LAB — CBC
Hematocrit: 41.7 % (ref 34.0–46.6)
Hemoglobin: 13.9 g/dL (ref 11.1–15.9)
MCH: 31.1 pg (ref 26.6–33.0)
MCHC: 33.3 g/dL (ref 31.5–35.7)
MCV: 93 fL (ref 79–97)
Platelets: 199 10*3/uL (ref 150–450)
RBC: 4.47 x10E6/uL (ref 3.77–5.28)
RDW: 12.3 % (ref 11.7–15.4)
WBC: 4.9 10*3/uL (ref 3.4–10.8)

## 2020-06-25 LAB — CMP14+EGFR
ALT: 22 IU/L (ref 0–32)
AST: 16 IU/L (ref 0–40)
Albumin/Globulin Ratio: 1.8 (ref 1.2–2.2)
Albumin: 4.4 g/dL (ref 3.8–4.8)
Alkaline Phosphatase: 55 IU/L (ref 48–121)
BUN/Creatinine Ratio: 12 (ref 9–23)
BUN: 11 mg/dL (ref 6–24)
Bilirubin Total: 0.5 mg/dL (ref 0.0–1.2)
CO2: 25 mmol/L (ref 20–29)
Calcium: 9.4 mg/dL (ref 8.7–10.2)
Chloride: 100 mmol/L (ref 96–106)
Creatinine, Ser: 0.95 mg/dL (ref 0.57–1.00)
GFR calc Af Amer: 86 mL/min/{1.73_m2} (ref >59)
GFR calc non Af Amer: 75 mL/min/{1.73_m2} (ref >59)
Globulin, Total: 2.5 g/dL (ref 1.5–4.5)
Glucose: 73 mg/dL (ref 65–99)
Potassium: 3.8 mmol/L (ref 3.5–5.2)
Sodium: 141 mmol/L (ref 134–144)
Total Protein: 6.9 g/dL (ref 6.0–8.5)

## 2020-06-25 LAB — LIPID PANEL
Chol/HDL Ratio: 3.8 ratio (ref 0.0–4.4)
Cholesterol, Total: 192 mg/dL (ref 100–199)
HDL: 51 mg/dL (ref >39)
LDL Chol Calc (NIH): 119 mg/dL — ABNORMAL HIGH (ref 0–99)
Triglycerides: 124 mg/dL (ref 0–149)
VLDL Cholesterol Cal: 22 mg/dL (ref 5–40)

## 2020-06-25 LAB — VITAMIN D 25 HYDROXY (VIT D DEFICIENCY, FRACTURES): Vit D, 25-Hydroxy: 26.7 ng/mL — ABNORMAL LOW (ref 30.0–100.0)

## 2020-06-29 ENCOUNTER — Encounter: Payer: Self-pay | Admitting: Nurse Practitioner

## 2020-06-29 ENCOUNTER — Other Ambulatory Visit: Payer: Self-pay | Admitting: Pharmacist

## 2020-06-29 ENCOUNTER — Telehealth: Payer: Self-pay | Admitting: Nurse Practitioner

## 2020-06-29 DIAGNOSIS — E669 Obesity, unspecified: Secondary | ICD-10-CM

## 2020-06-29 DIAGNOSIS — I10 Essential (primary) hypertension: Secondary | ICD-10-CM

## 2020-06-29 MED ORDER — SAXENDA 18 MG/3ML ~~LOC~~ SOPN: 2.4000 mg | PEN_INJECTOR | Freq: Every day | SUBCUTANEOUS | 2 refills | Status: DC

## 2020-06-29 NOTE — Telephone Encounter (Signed)
Patient was advised from pharmacist PA was needed for Liraglutide -Weight Management ([place]) 18 MG/3ML SOPN  , patient checking on the status of PA and would like to know if practice has samples. Patient states she has called twice prior to today, please advise best # [phone]

## 2020-06-29 NOTE — Telephone Encounter (Signed)
Pharmacy needed approval for 2.4 mg daily as the original rx was written for 0.24 mg weekly. Updated rx sent to the pharmacy.

## 2020-06-29 NOTE — Telephone Encounter (Signed)
Called to get clarification on an order that was sent to the pharmacy for Liraglutide -Weight Management (SAXENDA) 18 MG/3ML SOPN.  Please advise and call to discuss at [phone]

## 2020-07-01 ENCOUNTER — Telehealth: Payer: Self-pay

## 2020-07-01 NOTE — Telephone Encounter (Signed)
[place] 10/31/20

## 2020-07-07 MED FILL — SAXENDA 18 MG/3 ML PEN: 18 | 38 days supply | Qty: 15 | Fill #0

## 2020-07-15 ENCOUNTER — Ambulatory Visit: Payer: No Typology Code available for payment source | Admitting: Nurse Practitioner

## 2020-08-05 ENCOUNTER — Encounter: Payer: Self-pay | Admitting: Family

## 2020-08-05 ENCOUNTER — Ambulatory Visit: Payer: No Typology Code available for payment source | Attending: Family | Admitting: Family

## 2020-08-05 ENCOUNTER — Other Ambulatory Visit: Payer: Self-pay

## 2020-08-05 VITALS — BP 136/86 | HR 60 | Temp 97.0°F | Resp 18 | Ht 64.0 in | Wt 190.8 lb

## 2020-08-05 DIAGNOSIS — E669 Obesity, unspecified: Secondary | ICD-10-CM | POA: Diagnosis not present

## 2020-08-05 DIAGNOSIS — R002 Palpitations: Secondary | ICD-10-CM | POA: Diagnosis not present

## 2020-08-05 DIAGNOSIS — I10 Essential (primary) hypertension: Secondary | ICD-10-CM | POA: Diagnosis not present

## 2020-08-05 MED ORDER — HYDROCHLOROTHIAZIDE 25 MG PO TABS: 25.0000 mg | ORAL_TABLET | Freq: Every day | ORAL | 0 refills | Status: DC

## 2020-08-05 MED ORDER — PEN NEEDLES 31G X 8 MM MISC: 6 refills | Status: DC

## 2020-08-05 MED FILL — UNIFINE PENTIPS 8MM 31G: 31G X 8 MM | 90 days supply | Qty: 100 | Fill #0

## 2020-08-05 NOTE — Progress Notes (Signed)
Patient ID: Ashley Summers, female    DOB: [birth year]  MRN: [id number]  CC: HYPERTENSION FOLLOW-UP  Subjective: Ashley Summers is a 39 y.o. female with history of hemorrhoid and obstructive sleep apnea who presents for blood pressure check.   1. HYPERTENSION FOLLOW-UP: 06/17/2020: Visit with nurse practitioner Raul Del to establish care. During that encounter discussed blood pressure and continued antihypertensive (Hydrochlorothiazide). Asked to bring blood pressure log in at follow-up appointment.   08/05/2020: Currently taking: see medication list Have you taken your blood pressure medication today: []  Yes []  No  Med Adherence: [x]  Yes    []  No Medication side effects: []  Yes    [x]  No Adherence with salt restriction: [x]  Yes    []  No Exercise: Yes [x]  No []  Home Monitoring?: [x]  Yes    []  No Monitoring Frequency: []  Yes    []  No Home BP results range: [x]  Yes    []  No 126-134/78-88 Smoking []  Yes [x]  No SOB? []  Yes    [x]  No Chest Pain?: []  Yes    [x]  No Leg swelling?: []  Yes    [x]  No Headaches?: []  Yes    [x]  No Dizziness? []  Yes    [x]  No Comments:   2. PALPITATIONS: 06/17/2020: Visit with nurse practitioner Raul Del. During that encounter patient reported taking Phentermine which caused her to have insomnia and causing her heart to race.   At that time also discussed anxiety with associated symptoms being panic attacks and palpitations. . Patient previously taking Paxil which gave her headaches. Patient was not interested in SSRI. Requested another medication as needed. Hydroxyzine prescribed during the same encounter.   08/05/2020: Duration: months Symptom description: sometimes feels like heartbeat is going too low and then other times feels like they are going too fast Duration of episode: minutes Frequency: reports some weeks she will have palpitations twice weekly and then she will not have any for a while and then they will randomly return Activity when event  occurred: most recently being 2 weeks ago happened when she was sitting at her computer Related to exertion: no Dyspnea: no Chest pain: no Syncope: no Anxiety/stress: patient with history of anxiety and states she does not feel palpitations are related to anxiety, patient also reports she does have panic attacks Nausea/vomiting: no Diaphoresis: no Coronary artery disease: no Congestive heart failure: no Arrhythmia:no Thyroid disease: no Caffeine intake: reports she does have 1 cup of coffee in the morning Status:  worse Treatments attempted:anti-anxiety medication , reports the last time she took was 2 weeks ago prior to palpitations occurrence   Patient Active Problem List   Diagnosis Date Noted   OSA (obstructive sleep apnea) 04/23/2018   Hemorrhoid 02/03/2016   IUD check up 02/03/2016     Current Outpatient Medications on File Prior to Visit  Medication Sig Dispense Refill   hydrochlorothiazide (HYDRODIURIL) 25 MG tablet TAKE 1 TABLET BY MOUTH DAILY. 90 tablet 2   hydrOXYzine (ATARAX/VISTARIL) 10 MG tablet Take 1 tablet (10 mg total) by mouth every 6 (six) hours as needed for anxiety. 60 tablet 3   ibuprofen (ADVIL) 600 MG tablet Take 1 tablet (600 mg total) by mouth every 6 (six) hours as needed. 30 tablet 4   levonorgestrel (MIRENA) 20 MCG/24HR IUD 1 each by Intrauterine route once.     nystatin cream (MYCOSTATIN) Apply 1 application topically 2 (two) times daily. Under breast 30 g 1   Vitamin D, Ergocalciferol, (DRISDOL) 1.25 MG (50000 UT) CAPS capsule Take  1 capsule (50,000 Units total) by mouth every 7 (seven) days. 12 capsule 0   No current facility-administered medications on file prior to visit.    Allergies  Allergen Reactions   Penicillins Rash    Social History   Socioeconomic History   Marital status: Single    Spouse name: Not on file   Number of children: Not on file   Years of education: Not on file   Highest education level: Not on file   Occupational History   Not on file  Tobacco Use   Smoking status: Never Smoker   Smokeless tobacco: Never Used  Vaping Use   Vaping Use: Never used  Substance and Sexual Activity   Alcohol use: Yes    Alcohol/week: 0.0 standard drinks    Comment: occasional   Drug use: No   Sexual activity: Yes    Partners: Male    Birth control/protection: I.U.D.  Other Topics Concern   Not on file  Social History Narrative   Work or School: Chartered certified accountant, [employer], [employer] unit      Home Situation: lives with 3 kids (17,15, 9)      Spiritual Beliefs: none      Lifestyle: diet is so so; has been trying to exercising            Social Determinants of Health   Financial Resource Strain:    Difficulty of Paying Living Expenses: Not on file  Food Insecurity:    Worried About Charity fundraiser in the Last Year: Not on file   [employer] of Food in the Last Year: Not on file  Transportation Needs:    Lack of Transportation (Medical): Not on file   Lack of Transportation (Non-Medical): Not on file  Physical Activity:    Days of Exercise per Week: Not on file   Minutes of Exercise per Session: Not on file  Stress:    Feeling of Stress : Not on file  Social Connections:    Frequency of Communication with Friends and Family: Not on file   Frequency of Social Gatherings with Friends and Family: Not on file   Attends Religious Services: Not on file   Active Member of Clubs or Organizations: Not on file   Attends Archivist Meetings: Not on file   Marital Status: Not on file  Intimate Partner Violence:    Fear of Current or Ex-Partner: Not on file   Emotionally Abused: Not on file   Physically Abused: Not on file   Sexually Abused: Not on file    Family History  Problem Relation Age of Onset   Hypertension Mother    Stroke Mother     Past Surgical History:  Procedure Laterality Date   BREAST SURGERY     Reduction    Hammertoe correction       ROS: Review of Systems Negative except as stated above  PHYSICAL EXAM: Vitals with BMI 08/05/2020 06/17/2020 11/23/2019  Height 5\' 4"  5\' 4"  -  Weight 190 lbs 13 oz 192 lbs 204 lbs  BMI 66.06 30.16 35  Systolic 010 - 932  Diastolic 86 - 86  Pulse 60 - 55    Wt Readings from Last 3 Encounters:  08/05/20 190 lb 12.8 oz (86.5 kg)  06/17/20 192 lb (87.1 kg)  11/23/19 204 lb (92.5 kg)    Physical Exam General appearance - alert, well appearing, and in no distress and oriented to person, place, and time Mental status - alert,  oriented to person, place, and time, normal mood, behavior, speech, dress, motor activity, and thought processes Chest - clear to auscultation, no wheezes, rales or rhonchi, symmetric air entry, no tachypnea, retractions or cyanosis Heart - normal rate, regular rhythm, normal S1, S2, no murmurs, rubs, clicks or gallops Neurological - alert, oriented, normal speech, no focal findings or movement disorder noted, neck supple without rigidity, cranial nerves II through XII intact, normal muscle tone, no tremors, strength 5/5, Romberg sign negative, normal gait and station  ASSESSMENT AND PLAN: 1. Essential hypertension: - Blood pressure close to goal during today's visit. Patient without chest pressure, chest pain, palpitations, and shortness of breath. - Continue Hydrochlorothiazide as prescribed for high blood pressure.  - Counseled on blood pressure goal of less than 130/80, low-sodium, DASH diet, medication compliance, 150 minutes of moderate intensity exercise per week as tolerated. Discussed medication compliance, adverse effects. - Last CMP and CBC obtained 06/24/2020. - Follow-up with primary provider in 3 months or sooner if needed. - hydrochlorothiazide (HYDRODIURIL) 25 MG tablet; Take 1 tablet (25 mg total) by mouth daily.  Dispense: 90 tablet; Refill: 0  2. Palpitations: - Visit 06/17/2020. During that encounter patient reported taking Phentermine which  caused her to have insomnia and causing her heart to race.  - During 06/17/2020 visit also discussed anxiety with associated symptoms being panic attacks and palpitations. Patient previously taking Paxil which gave her headaches. Patient was not interested in SSRI. Requested another medication as needed. Hydroxyzine prescribed during the same encounter. - Today patient in office without palpitations, chest pain, chest pressure, and shortness of breath. Reports she does not feel palpitations are related to anxiety as she was having palpitations years ago which spontaneously resolved. Reports current palpitations returned around March 2021. Last palpitations occurred 2 weeks ago while sitting at computer. Reports last time she took Hydroxyzine was 2 weeks ago prior to palpitations.  - Patient was given clear instructions to go to Emergency Department or return to medical center if symptoms don't improve, worsen, or new problems develop.The patient verbalized understanding. - Referral to Cardiology for further evaluation and management.  - Follow-up with primary provider as needed.  - Ambulatory referral to Cardiology  3. Obesity (BMI 30-39.9): - Patient currently taking Liraglutide 0.24 mg total weekly injections. Requests pen needle refills.  - Insulin Pen Needle (PEN NEEDLES) 31G X 8 MM MISC; UAD  Dispense: 100 each; Refill: 6  Patient was given the opportunity to ask questions.  Patient verbalized understanding of the plan and was able to repeat key elements of the plan. Patient was given clear instructions to go to Emergency Department or return to medical center if symptoms don't improve, worsen, or new problems develop.The patient verbalized understanding.   Camillia Herter, NP

## 2020-08-05 NOTE — Patient Instructions (Addendum)
Continue Hydrochlorothiazide for high blood pressure. Referral to Cardiology for palpitations. Follow-up with primary provider in 3 months or sooner if needed. Hypertension, Adult Hypertension is another name for high blood pressure. High blood pressure forces your heart to work harder to pump blood. This can cause problems over time. There are two numbers in a blood pressure reading. There is a top number (systolic) over a bottom number (diastolic). It is best to have a blood pressure that is below 120/80. Healthy choices can help lower your blood pressure, or you may need medicine to help lower it. What are the causes? The cause of this condition is not known. Some conditions may be related to high blood pressure. What increases the risk?  Smoking.  Having type 2 diabetes mellitus, high cholesterol, or both.  Not getting enough exercise or physical activity.  Being overweight.  Having too much fat, sugar, calories, or salt (sodium) in your diet.  Drinking too much alcohol.  Having long-term (chronic) kidney disease.  Having a family history of high blood pressure.  Age. Risk increases with age.  Race. You may be at higher risk if you are African American.  Gender. Men are at higher risk than women before age 62. After age 67, women are at higher risk than men.  Having obstructive sleep apnea.  Stress. What are the signs or symptoms?  High blood pressure may not cause symptoms. Very high blood pressure (hypertensive crisis) may cause: ? Headache. ? Feelings of worry or nervousness (anxiety). ? Shortness of breath. ? Nosebleed. ? A feeling of being sick to your stomach (nausea). ? Throwing up (vomiting). ? Changes in how you see. ? Very bad chest pain. ? Seizures. How is this treated?  This condition is treated by making healthy lifestyle changes, such as: ? Eating healthy foods. ? Exercising more. ? Drinking less alcohol.  Your health care provider may prescribe  medicine if lifestyle changes are not enough to get your blood pressure under control, and if: ? Your top number is above 130. ? Your bottom number is above 80.  Your personal target blood pressure may vary. Follow these instructions at home: Eating and drinking   If told, follow the DASH eating plan. To follow this plan: ? Fill one half of your plate at each meal with fruits and vegetables. ? Fill one fourth of your plate at each meal with whole grains. Whole grains include whole-wheat pasta, brown rice, and whole-grain bread. ? Eat or drink low-fat dairy products, such as skim milk or low-fat yogurt. ? Fill one fourth of your plate at each meal with low-fat (lean) proteins. Low-fat proteins include fish, chicken without skin, eggs, beans, and tofu. ? Avoid fatty meat, cured and processed meat, or chicken with skin. ? Avoid pre-made or processed food.  Eat less than 1,500 mg of salt each day.  Do not drink alcohol if: ? Your doctor tells you not to drink. ? You are pregnant, may be pregnant, or are planning to become pregnant.  If you drink alcohol: ? Limit how much you use to:  0-1 drink a day for women.  0-2 drinks a day for men. ? Be aware of how much alcohol is in your drink. In the [place] one drink equals one 12 oz bottle of beer (355 mL), one 5 oz glass of wine (148 mL), or one 1 oz glass of hard liquor (44 mL). Lifestyle   Work with your doctor to stay at a healthy weight or  to lose weight. Ask your doctor what the best weight is for you.  Get at least 30 minutes of exercise most days of the week. This may include walking, swimming, or biking.  Get at least 30 minutes of exercise that strengthens your muscles (resistance exercise) at least 3 days a week. This may include lifting weights or doing Pilates.  Do not use any products that contain nicotine or tobacco, such as cigarettes, e-cigarettes, and chewing tobacco. If you need help quitting, ask your doctor.  Check  your blood pressure at home as told by your doctor.  Keep all follow-up visits as told by your doctor. This is important. Medicines  Take over-the-counter and prescription medicines only as told by your doctor. Follow directions carefully.  Do not skip doses of blood pressure medicine. The medicine does not work as well if you skip doses. Skipping doses also puts you at risk for problems.  Ask your doctor about side effects or reactions to medicines that you should watch for. Contact a doctor if you:  Think you are having a reaction to the medicine you are taking.  Have headaches that keep coming back (recurring).  Feel dizzy.  Have swelling in your ankles.  Have trouble with your vision. Get help right away if you:  Get a very bad headache.  Start to feel mixed up (confused).  Feel weak or numb.  Feel faint.  Have very bad pain in your: ? Chest. ? Belly (abdomen).  Throw up more than once.  Have trouble breathing. Summary  Hypertension is another name for high blood pressure.  High blood pressure forces your heart to work harder to pump blood.  For most people, a normal blood pressure is less than 120/80.  Making healthy choices can help lower blood pressure. If your blood pressure does not get lower with healthy choices, you may need to take medicine. This information is not intended to replace advice given to you by your health care provider. Make sure you discuss any questions you have with your health care provider. Document Revised: 07/30/2018 Document Reviewed: 07/30/2018 Elsevier Patient Education  2020 [employer].

## 2020-08-05 NOTE — Progress Notes (Signed)
C/o palpitations, Had previously, hydroxyzine helped some but have recently returned within last few months

## 2020-08-23 MED FILL — HYDROCHLOROTHIAZIDE 25 MG T: 25 | 90 days supply | Qty: 90 | Fill #2

## 2020-09-09 ENCOUNTER — Other Ambulatory Visit: Payer: Self-pay

## 2020-09-09 ENCOUNTER — Ambulatory Visit (INDEPENDENT_AMBULATORY_CARE_PROVIDER_SITE_OTHER): Payer: No Typology Code available for payment source | Admitting: Cardiovascular Disease

## 2020-09-09 ENCOUNTER — Encounter: Payer: Self-pay | Admitting: Cardiovascular Disease

## 2020-09-09 VITALS — BP 134/78 | HR 60 | Ht 64.0 in | Wt 198.0 lb

## 2020-09-09 DIAGNOSIS — I10 Essential (primary) hypertension: Secondary | ICD-10-CM

## 2020-09-09 DIAGNOSIS — R002 Palpitations: Secondary | ICD-10-CM | POA: Diagnosis not present

## 2020-09-09 HISTORY — DX: Palpitations: R00.2

## 2020-09-09 HISTORY — DX: Essential (primary) hypertension: I10

## 2020-09-09 NOTE — Patient Instructions (Addendum)
Medication Instructions:  Your physician recommends that you continue on your current medications as directed. Please refer to the Current Medication list given to you today.  *If you need a refill on your cardiac medications before your next appointment, please call your pharmacy*  Lab Work: TSH TODAY   If you have labs (blood work) drawn today and your tests are completely normal, you will receive your results only by: Marland Kitchen MyChart Message (if you have MyChart) OR . A paper copy in the mail If you have any lab test that is abnormal or we need to change your treatment, we will call you to review the results.  Testing/Procedures: NONE   Follow-Up: At [facility], you and your health needs are our priority.  As part of our continuing mission to provide you with exceptional heart care, we have created designated Provider Care Teams.  These Care Teams include your primary Cardiologist (physician) and Advanced Practice Providers (APPs -  Physician Assistants and Nurse Practitioners) who all work together to provide you with the care you need, when you need it.  We recommend signing up for the patient portal called "MyChart".  Sign up information is provided on this After Visit Summary.  MyChart is used to connect with patients for Virtual Visits (Telemedicine).  Patients are able to view lab/test results, encounter notes, upcoming appointments, etc.  Non-urgent messages can be sent to your provider as well.   To learn more about what you can do with MyChart, go to [email or web address].    Your next appointment:   6 month(s)  The format for your next appointment:   In Person  Provider:   You may see DR [facility] or one of the following Advanced Practice Providers on your designated Care Team:    Kerin Ransom, PA-C  [place], [place]  Coletta Memos, [place]  Other Instructions  CALL IF YOU START HAVING PALPITATIONS MORE FREQUENTLY

## 2020-09-09 NOTE — Progress Notes (Signed)
Cardiology Office Note   Date:  09/09/2020   ID:  Ashley Summers, Ashley Summers 21-Aug-1981, MRN [id number]  PCP:  Gildardo Pounds, NP  Cardiologist:   Skeet Latch, MD   No chief complaint on file.   History of Present Illness: Ashley Summers is a 39 y.o. female with hypertension, anxiety, and OSA who is being seen today for the evaluation of palpitations at the request of Ashley Herter, NP.  Ashley Summers saw her PCP on 08/2020 and reported palpitations.  She had palpitations about 3 years ago and they subsided.  Over the last year she started having them again.  It was getting worse and lasting longer but hasn't felt any in the last month.  The last severe episode lasted for about four minutes.  It felt like her heart was dropping.  There is no associated shortness of breath or chest pain.  It feels like something is weighing on her.  There is no associated lightheadedness or dizziness.  The episodes tend to occur at rest and more when she is under stress.  She notes some especially when at work and things are hectic.  She works as a Psychologist, sport and exercise.  She started going to the gym one month ago.  She like to use the elliptical and walks.  She gets short of breath but has no chest pain.  She doesn't have the palpitations when she exercises.  She has no lower extremity edema, orthopnea, or PND.  When she checks her blood pressure at home it typically is in the 130s over 70s.   Past Medical History:  Diagnosis Date  . Allergy   . Anxiety   . Essential hypertension 09/09/2020  . Hypertension   . Palpitations 09/09/2020    Past Surgical History:  Procedure Laterality Date  . BREAST SURGERY     Reduction   . Hammertoe correction       Current Outpatient Medications  Medication Sig Dispense Refill  . BIOTIN PO Take by mouth daily.    . cetirizine (ZYRTEC) 10 MG tablet Take by mouth.    . cholecalciferol (VITAMIN D3) 25 MCG (1000 UNIT) tablet Take 2,000 Units by mouth daily.     . hydrochlorothiazide (HYDRODIURIL) 25 MG tablet Take 1 tablet (25 mg total) by mouth daily. 90 tablet 0  . hydrOXYzine (ATARAX/VISTARIL) 10 MG tablet Take 1 tablet (10 mg total) by mouth every 6 (six) hours as needed for anxiety. 60 tablet 3  . Insulin Pen Needle (PEN NEEDLES) 31G X 8 MM MISC UAD 100 each 6  . levonorgestrel (MIRENA) 20 MCG/24HR IUD 1 each by Intrauterine route once.    . Liraglutide -Weight Management (SAXENDA) 18 MG/3ML SOPN Inject into the skin once a week.    . Melatonin 10 MG TABS Take by mouth as needed.    . Multiple Vitamin (MULTIVITAMIN) tablet Take 1 tablet by mouth daily.    Marland Kitchen nystatin cream (MYCOSTATIN) Apply 1 application topically 2 (two) times daily. Under breast 30 g 1  . zinc gluconate 50 MG tablet Take 50 mg by mouth daily.     No current facility-administered medications for this visit.    Allergies:   Penicillins    Social History:  The patient  reports that she has never smoked. She has never used smokeless tobacco. She reports current alcohol use. She reports that she does not use drugs.   Family History:  The patient's family history includes Hyperlipidemia in her maternal grandmother;  Hypertension in her brother, maternal grandmother, and mother; Pulmonary embolism in her mother; Stroke in her mother.    ROS:  Please see the history of present illness.   Otherwise, review of systems are positive for none.   All other systems are reviewed and negative.    PHYSICAL EXAM: VS:  BP 134/78   Pulse 60   Ht 5\' 4"  (1.626 m)   Wt 198 lb (89.8 kg)   SpO2 99%   BMI 33.99 kg/m  , BMI Body mass index is 33.99 kg/m. GENERAL:  Well appearing HEENT:  Pupils equal round and reactive, fundi not visualized, oral mucosa unremarkable NECK:  No jugular venous distention, waveform within normal limits, carotid upstroke brisk and symmetric, no bruits, no thyromegaly LYMPHATICS:  No cervical adenopathy LUNGS:  Clear to auscultation bilaterally HEART:  RRR.   PMI not displaced or sustained,S1 and S2 within normal limits, no S3, no S4, no clicks, no rubs, no murmurs ABD:  Flat, positive bowel sounds normal in frequency in pitch, no bruits, no rebound, no guarding, no midline pulsatile mass, no hepatomegaly, no splenomegaly EXT:  2 plus pulses throughout, no edema, no cyanosis no clubbing SKIN:  No rashes no nodules NEURO:  Cranial nerves II through XII grossly intact, motor grossly intact throughout PSYCH:  Cognitively intact, oriented to person place and time   EKG:  EKG is ordered today. The ekg ordered today demonstrates sinus rhythm.  Rate 60 bpm   Recent Labs: 11/18/2019: Pro B Natriuretic peptide (BNP) 15.0; TSH 1.14 06/24/2020: ALT 22; BUN 11; Creatinine, Ser 0.95; Hemoglobin 13.9; Platelets 199; Potassium 3.8; Sodium 141    Lipid Panel    Component Value Date/Time   CHOL 192 06/24/2020 0857   TRIG 124 06/24/2020 0857   HDL 51 06/24/2020 0857   CHOLHDL 3.8 06/24/2020 0857   CHOLHDL 4 11/18/2019 0908   VLDL 24.6 11/18/2019 0908   LDLCALC 119 (H) 06/24/2020 0857      Wt Readings from Last 3 Encounters:  09/09/20 198 lb (89.8 kg)  08/05/20 190 lb 12.8 oz (86.5 kg)  06/17/20 192 lb (87.1 kg)      ASSESSMENT AND PLAN:  # Palpitations:  Her symptoms sound like PACs or PVCs.  She had normal blood counts and electrolytes 06/2020.  Her thyroid has not been checked since 2020.  We will repeat a TSH today.  Given that she has not really been having them in the last month we will not have her wear an ambulatory monitor now.  We discussed getting an alive core monitor or wearing a ZIO.  She thinks that it started happening again she will call us and we will get a ZIO.  It sounds as though they are very triggered by anxiety and stress.  She does not have any alarm symptoms such as chest pain, exertional symptoms, lightheadedness, or dizziness.  # Essential hypertension: BP nearly at goal.  Keep up the exercise and limit sodium intake.   Continue HCTZ.   Current medicines are reviewed at length with the patient today.  The patient does not have concerns regarding medicines.  The following changes have been made:  no change  Labs/ tests ordered today include:   Orders Placed This Encounter  Procedures  . TSH  . EKG 12-Lead     Disposition:   FU with Anila Bojarski C. Oval Linsey, MD, [facility] in 6 months     Signed, Shizuo Biskup C. Oval Linsey, MD, [facility]  09/09/2020 2:31 PM    [facility]

## 2020-09-10 LAB — TSH: TSH: 1.42 u[IU]/mL (ref 0.450–4.500)

## 2020-09-20 ENCOUNTER — Other Ambulatory Visit: Payer: Self-pay

## 2020-09-20 ENCOUNTER — Ambulatory Visit (INDEPENDENT_AMBULATORY_CARE_PROVIDER_SITE_OTHER): Payer: No Typology Code available for payment source | Admitting: Plastic Surgery

## 2020-09-20 ENCOUNTER — Encounter: Payer: Self-pay | Admitting: Plastic Surgery

## 2020-09-20 DIAGNOSIS — M793 Panniculitis, unspecified: Secondary | ICD-10-CM | POA: Diagnosis not present

## 2020-09-20 NOTE — Progress Notes (Signed)
Patient ID: Ashley Summers, female    DOB: 1981/01/29, 39 y.o.   MRN: [id number]   Chief Complaint  Patient presents with  . Advice Only  . Skin Problem    The patient is a 39 year old female here for evaluation of her abdomen.  She complains of panniculitis with rashes in her skin folds.  She complains of back pain unrelieved by change in weight.  She is working on weight reduction.  She is 5 feet 4 inches tall weighs 200 pounds.  She has had injections for weight loss and taken phentermine in the past.  She had a breast reduction 10 years ago.  She does not smoke and does not have diabetes.  Her recent hemoglobin A1c was 5.3.  She has 3 kids 1 of which was delivered by C-section.  She would like to lose 20 more pounds.   Review of Systems  Constitutional: Positive for activity change. Negative for appetite change.  HENT: Negative.   Eyes: Negative.   Respiratory: Negative.  Negative for chest tightness and shortness of breath.   Gastrointestinal: Negative for abdominal distention and abdominal pain.  Endocrine: Negative.   Genitourinary: Negative.   Musculoskeletal: Positive for back pain.  Neurological: Negative.   Hematological: Negative.   Psychiatric/Behavioral: Negative.     Past Medical History:  Diagnosis Date  . Allergy   . Anxiety   . Essential hypertension 09/09/2020  . Hypertension   . Palpitations 09/09/2020    Past Surgical History:  Procedure Laterality Date  . BREAST SURGERY     Reduction   . Hammertoe correction        Current Outpatient Medications:  .  BIOTIN PO, Take by mouth daily., Disp: , Rfl:  .  cetirizine (ZYRTEC) 10 MG tablet, Take by mouth., Disp: , Rfl:  .  cholecalciferol (VITAMIN D3) 25 MCG (1000 UNIT) tablet, Take 2,000 Units by mouth daily., Disp: , Rfl:  .  hydrochlorothiazide (HYDRODIURIL) 25 MG tablet, Take 1 tablet (25 mg total) by mouth daily., Disp: 90 tablet, Rfl: 0 .  hydrOXYzine (ATARAX/VISTARIL) 10 MG tablet, Take 1  tablet (10 mg total) by mouth every 6 (six) hours as needed for anxiety., Disp: 60 tablet, Rfl: 3 .  Insulin Pen Needle (PEN NEEDLES) 31G X 8 MM MISC, UAD, Disp: 100 each, Rfl: 6 .  levonorgestrel (MIRENA) 20 MCG/24HR IUD, 1 each by Intrauterine route once., Disp: , Rfl:  .  Liraglutide -Weight Management (SAXENDA) 18 MG/3ML SOPN, Inject into the skin once a week., Disp: , Rfl:  .  Melatonin 10 MG TABS, Take by mouth as needed., Disp: , Rfl:  .  Multiple Vitamin (MULTIVITAMIN) tablet, Take 1 tablet by mouth daily., Disp: , Rfl:  .  nystatin cream (MYCOSTATIN), Apply 1 application topically 2 (two) times daily. Under breast, Disp: 30 g, Rfl: 1 .  zinc gluconate 50 MG tablet, Take 50 mg by mouth daily., Disp: , Rfl:    Objective:   Vitals:   09/20/20 1319  BP: (!) 145/94  Pulse: 74  Temp: 98.3 F (36.8 C)  SpO2: 99%    Physical Exam Vitals and nursing note reviewed.  Constitutional:      Appearance: Normal appearance.  HENT:     Head: Normocephalic.  Cardiovascular:     Rate and Rhythm: Normal rate.     Pulses: Normal pulses.  Pulmonary:     Effort: Pulmonary effort is normal.  Abdominal:     General: Abdomen is flat.  There is no distension.     Tenderness: There is no abdominal tenderness.  Skin:    General: Skin is warm.     Capillary Refill: Capillary refill takes less than 2 seconds.  Neurological:     General: No focal deficit present.     Mental Status: She is alert and oriented to person, place, and time.  Psychiatric:        Mood and Affect: Mood normal.        Behavior: Behavior normal.        Thought Content: Thought content normal.     Assessment & Plan:  Panniculitis  We will send for physical therapy for evaluation and treatment for her back pain.  I would also have encouraged her to lose at least 10 if not 20 pounds prior to surgery.  Once she is completed physical therapy and lost at least 10 pounds she will call us back and come back in for an  evaluation.  When the time comes she would be a good candidate for a panniculectomy.  Pictures were obtained of the patient and placed in the chart with the patient's or guardian's permission.   [place], DO

## 2020-09-23 ENCOUNTER — Other Ambulatory Visit: Payer: Self-pay | Admitting: Obstetrics and Gynecology

## 2020-09-23 MED FILL — SAXENDA 18 MG/3 ML PEN: 18 | 38 days supply | Qty: 15 | Fill #1

## 2020-10-05 ENCOUNTER — Other Ambulatory Visit: Payer: Self-pay | Admitting: Obstetrics and Gynecology

## 2020-10-05 DIAGNOSIS — Z1231 Encounter for screening mammogram for malignant neoplasm of breast: Secondary | ICD-10-CM

## 2020-10-07 ENCOUNTER — Encounter: Payer: Self-pay | Admitting: Physical Therapy

## 2020-10-07 ENCOUNTER — Ambulatory Visit: Payer: No Typology Code available for payment source | Attending: Plastic Surgery | Admitting: Physical Therapy

## 2020-10-07 ENCOUNTER — Encounter: Payer: No Typology Code available for payment source | Admitting: Physical Therapy

## 2020-10-07 ENCOUNTER — Other Ambulatory Visit: Payer: Self-pay

## 2020-10-07 DIAGNOSIS — G8929 Other chronic pain: Secondary | ICD-10-CM | POA: Insufficient documentation

## 2020-10-07 DIAGNOSIS — M545 Low back pain, unspecified: Secondary | ICD-10-CM | POA: Diagnosis present

## 2020-10-07 NOTE — Therapy (Signed)
Ashley Summers, [place], [address] Phone: [phone]   Fax:  [phone]  Physical Therapy Evaluation  Patient Details  Name: Ashley Summers MRN: [id number] Date of Birth: [birth year] Referring Provider (PT): Audelia Hives, DO   Encounter Date: 10/07/2020   PT End of Session - 10/07/20 0813    Visit Number 1    Number of Visits 8    Date for PT Re-Evaluation 11/18/20    Authorization Type [facility], recheck FOTO status by visit 6    PT Start Time 0717    PT Stop Time 0759    PT Time Calculation (min) 42 min    Activity Tolerance Patient tolerated treatment well    Behavior During Therapy [facility] for tasks assessed/performed           Past Medical History:  Diagnosis Date  . Allergy   . Anxiety   . Essential hypertension 09/09/2020  . Hypertension   . Palpitations 09/09/2020    Past Surgical History:  Procedure Laterality Date  . BREAST SURGERY     Reduction   . Hammertoe correction      There were no vitals filed for this visit.    Subjective Assessment - 10/07/20 0724    Subjective Pt. is a 39 y/o female diagnosed with panniculitis with associated abdominal rash issues referred to PT for associated LBP. She reports approximately 8 year history of insidious onset chronic LBP issues. No mechanism of injury noted. Pain is primarily local to lower lumbar regions but intermittently radiates up toward thoracic region. No LE radiating/radicular symptoms noted and no bowel/bladder changes or saddle parasthesias.    Pertinent History 8 year history chronic LBP    Limitations Sitting;Lifting;Walking;Standing    Patient Stated Goals Build core strength    Currently in Pain? Yes    Pain Score 4     Pain Location Back    Pain Orientation Lower;Mid    Pain Descriptors / Indicators Dull;Aching    Pain Type Chronic pain    Pain Onset More than a month ago    Pain Frequency Constant    Aggravating  Factors  prolonged sitting, prolonged standing (stationary), sometimes sleep disturbance    Pain Relieving Factors medication, movement    Effect of Pain on Daily Activities impacts positional tolerance              [facility] PT Assessment - 10/07/20 0001      Assessment   Medical Diagnosis LBP, panniculitis    Referring Provider (PT) Audelia Hives, DO    Onset Date/Surgical Date --   chronic history LBP x 8 years   Prior Therapy none      Precautions   Precautions None      Restrictions   Weight Bearing Restrictions No      Balance Screen   Has the patient fallen in the past 6 months No      Prior Function   Level of Independence Independent with basic ADLs;Independent with community mobility without device      Cognition   Overall Cognitive Status Within Functional Limits for tasks assessed      Observation/Other Assessments   Focus on Therapeutic Outcomes (FOTO)  35% limited      Posture/Postural Control   Posture Comments mild rounding of lumbar spine for sitting posture      ROM / Strength   AROM / PROM / Strength AROM;Strength      AROM   Overall  AROM Comments Bilateral hip AROM/PROM grossly [facility]    AROM Assessment Site Lumbar    Lumbar Flexion 85   increased local LBP   Lumbar Extension 30    Lumbar - Right Side Bend 28    Lumbar - Left Side Bend 24    Lumbar - Right Rotation WFL    Lumbar - Left Rotation [facility]      Strength   Strength Assessment Site Hip;Knee;Ankle    Right/Left Hip Right;Left    Right Hip Flexion 4/5    Right Hip Extension 4/5    Right Hip External Rotation  5/5    Right Hip Internal Rotation 5/5    Right Hip ABduction 4/5    Left Hip Flexion 4/5    Left Hip Extension 4/5    Left Hip External Rotation 5/5    Left Hip Internal Rotation 5/5    Left Hip ABduction 4/5    Right/Left Knee Right;Left    Right Knee Flexion 5/5    Right Knee Extension 5/5    Left Knee Flexion 5/5    Left Knee Extension 5/5    Right/Left Ankle  Right;Left    Right Ankle Dorsiflexion 5/5    Right Ankle Inversion 5/5    Right Ankle Eversion 5/5    Left Ankle Dorsiflexion 5/5    Left Ankle Inversion 5/5    Left Ankle Eversion 5/5      Flexibility   Soft Tissue Assessment /Muscle Length --   hamstring tightness SLR 70 deg, mild piriformis tightness      Palpation   Palpation comment Tender to palpation lumbar paraspinals L4-5 region, pain with L4-5 CPAs      Special Tests   Other special tests SLR (-), FABER (-), Thigh thrust (+) on right, (-) on left, SI compression (-), SI distraction (+)                      Objective measurements completed on examination: See above findings.       [facility] Adult PT Treatment/Exercise - 10/07/20 0001      Exercises   Exercises --   HEP handout review                 PT Education - 10/07/20 0813    Education Details HEP, POC, eval findings, reviewed FOTO patint report    Person(s) Educated Patient    Methods Explanation;Demonstration;Verbal cues;Handout    Comprehension Verbalized understanding               PT Long Term Goals - 10/07/20 0819      PT LONG TERM GOAL #1   Title Independent with HEP    Baseline needs HEP    Time 6    Period Weeks    Status New    Target Date 11/18/20      PT LONG TERM GOAL #2   Title Improve FOTO outcome measure score to 25% or less impairment    Baseline 35% limited    Time 6    Period Weeks    Status New    Target Date 11/18/20      PT LONG TERM GOAL #3   Title Tolerate sitting for work, eating meals, car travel for periods at least 30-40 minutes with LBP 2/10 or less    Baseline 4/10 pain at eval increased with prolonged sitting    Time 6    Period Weeks    Status New  Target Date 11/18/20      PT LONG TERM GOAL #4   Title Increase bilateral hip abduction and extension strength at least 1/2 MMT grade to improve lumbopelvic stability to help decrease LBP with prolonged positioning    Baseline 4/5     Time 6    Period Weeks    Status New    Target Date 11/18/20      PT LONG TERM GOAL #5   Title Return demos for body mechanics for sitting posture, lifting mechanics    Time 6    Period Weeks    Status New    Target Date 11/18/20                  Plan - 10/07/20 0814    Clinical Impression Statement Pt. presents with chronic localized LBP symptoms with hip and core weakness, mild extension bias noted for lumbar ROM. Pt. did have 2 positive SI provocative tests but pain reproduced was not concordant for primary symptoms which were local to lower lumbar region. Plan trial PT to see if symptoms can be improved with conservative tx. measures.    Personal Factors and Comorbidities Time since onset of injury/illness/exacerbation    Examination-Activity Limitations Bend;Lift;Sit;Stand;Locomotion Level    Examination-Participation Restrictions Community Activity;Occupation    Stability/Clinical Decision Making Stable/Uncomplicated    Clinical Decision Making Low    Rehab Potential Good    PT Frequency --   1-2x/week   PT Duration 6 weeks    PT Treatment/Interventions ADLs/Self Care Home Management;Cryotherapy;Electrical Stimulation;Moist Heat;Taping;Therapeutic exercise;Functional mobility training;Neuromuscular re-education;Therapeutic activities;Patient/family education;Manual techniques;Spinal Manipulations    PT Next Visit Plan Review HEP as needed, focus core/back and hip strengthening, extension bias ROM, stretch hamstrings and piriformis, lower lumbar STM/manual/PAs prn, pt. does not wish to try dry needling    PT Home Exercise Plan Access code: [email or web address]    Consulted and Agree with Plan of Care Patient           Patient will benefit from skilled therapeutic intervention in order to improve the following deficits and impairments:  Pain, Impaired flexibility, Decreased strength, Difficulty walking, Obesity  Visit Diagnosis: Chronic midline low back pain without  sciatica     Problem List Patient Active Problem List   Diagnosis Date Noted  . Panniculitis 09/20/2020  . Essential hypertension 09/09/2020  . Palpitations 09/09/2020  . OSA (obstructive sleep apnea) 04/23/2018  . Hemorrhoid 02/03/2016  . IUD check up 02/03/2016    Beaulah Dinning, PT, DPT 10/07/20 8:25 AM  [facility] [address] [place], [place], [address] Phone: [phone]   Fax:  [phone]  Name: Aylana Hirschfeld MRN: [id number] Date of Birth: Apr 06, 1981

## 2020-10-13 ENCOUNTER — Ambulatory Visit: Payer: No Typology Code available for payment source | Admitting: Physical Therapy

## 2020-10-17 ENCOUNTER — Other Ambulatory Visit: Payer: Self-pay

## 2020-10-17 ENCOUNTER — Ambulatory Visit: Payer: No Typology Code available for payment source | Attending: Nurse Practitioner | Admitting: Nurse Practitioner

## 2020-10-17 ENCOUNTER — Encounter: Payer: Self-pay | Admitting: Nurse Practitioner

## 2020-10-17 VITALS — BP 134/85 | HR 71 | Temp 96.6°F | Ht 64.5 in | Wt 196.0 lb

## 2020-10-17 DIAGNOSIS — E559 Vitamin D deficiency, unspecified: Secondary | ICD-10-CM | POA: Diagnosis not present

## 2020-10-17 DIAGNOSIS — E785 Hyperlipidemia, unspecified: Secondary | ICD-10-CM

## 2020-10-17 DIAGNOSIS — G4733 Obstructive sleep apnea (adult) (pediatric): Secondary | ICD-10-CM

## 2020-10-17 DIAGNOSIS — R002 Palpitations: Secondary | ICD-10-CM

## 2020-10-17 DIAGNOSIS — I10 Essential (primary) hypertension: Secondary | ICD-10-CM | POA: Diagnosis not present

## 2020-10-17 DIAGNOSIS — Z0000 Encounter for general adult medical examination without abnormal findings: Secondary | ICD-10-CM

## 2020-10-17 NOTE — Progress Notes (Signed)
Assessment & Plan:  [name] was seen today for annual exam.  Diagnoses and all orders for this visit:  Encounter for annual physical exam  Vitamin D deficiency disease -     VITAMIN D 25 Hydroxy (Vit-D Deficiency, Fractures)  Essential hypertension -     CMP14+EGFR  OSA (obstructive sleep apnea) -     Split night study; Future  Palpitations -     CBC  Dyslipidemia, goal LDL below 70 -     Lipid panel    Patient has been counseled on age-appropriate routine health concerns for screening and prevention. These are reviewed and up-to-date. Referrals have been placed accordingly. Immunizations are up-to-date or declined.    Subjective:   Chief Complaint  Patient presents with  . Annual Exam    Pt. is here for a physical exam.    HPI Ashley Summers 39 y.o. female presents to office today for annual physical. She has a mammogram scheduled for next month.  PMH: OSA (not on CPAP) Anxiety, HTN, and Palpitations (09/09/2020).    She is currently seeing plastics for Panniculitis.  She has seen Cardiology for palpitations which may likely be triggered by anxiety and stress. Watchful waiting at this time. If re occurs she will contact cardiology for event monitor.  OSA was not able to have test completed.    Review of Systems  Constitutional: Negative for fever, malaise/fatigue and weight loss.  HENT: Negative.  Negative for nosebleeds.   Eyes: Negative.  Negative for blurred vision, double vision and photophobia.  Respiratory: Negative.  Negative for cough and shortness of breath.   Cardiovascular: Negative.  Negative for chest pain, palpitations and leg swelling.  Gastrointestinal: Negative.  Negative for heartburn, nausea and vomiting.  Genitourinary: Negative.   Musculoskeletal: Positive for back pain (chronic. Pain relieved with NSAIDs and Tylenol. ). Negative for myalgias.  Skin: Negative.   Neurological: Negative.  Negative for dizziness, focal weakness, seizures  and headaches.  Endo/Heme/Allergies: Negative.   Psychiatric/Behavioral: Negative for suicidal ideas. The patient has insomnia.     Past Medical History:  Diagnosis Date  . Allergy   . Anxiety   . Essential hypertension 09/09/2020  . Hypertension   . Palpitations 09/09/2020    Past Surgical History:  Procedure Laterality Date  . BREAST SURGERY     Reduction   . Hammertoe correction      Family History  Problem Relation Age of Onset  . Hypertension Mother   . Stroke Mother   . Pulmonary embolism Mother   . Hypertension Brother   . Hyperlipidemia Maternal Grandmother   . Hypertension Maternal Grandmother     Social History Reviewed with no changes to be made today.   Outpatient Medications Prior to Visit  Medication Sig Dispense Refill  . BIOTIN PO Take by mouth daily.    . cetirizine (ZYRTEC) 10 MG tablet Take by mouth.    . cholecalciferol (VITAMIN D3) 25 MCG (1000 UNIT) tablet Take 2,000 Units by mouth daily.    . hydrochlorothiazide (HYDRODIURIL) 25 MG tablet Take 1 tablet (25 mg total) by mouth daily. 90 tablet 0  . hydrOXYzine (ATARAX/VISTARIL) 10 MG tablet Take 1 tablet (10 mg total) by mouth every 6 (six) hours as needed for anxiety. 60 tablet 3  . Insulin Pen Needle (PEN NEEDLES) 31G X 8 MM MISC UAD 100 each 6  . levonorgestrel (MIRENA) 20 MCG/24HR IUD 1 each by Intrauterine route once.    . Liraglutide -Weight Management (SAXENDA) 18  MG/3ML SOPN Inject into the skin once a week.    . Melatonin 10 MG TABS Take by mouth as needed.    . Multiple Vitamin (MULTIVITAMIN) tablet Take 1 tablet by mouth daily.    Marland Kitchen nystatin cream (MYCOSTATIN) Apply 1 application topically 2 (two) times daily. Under breast 30 g 1  . zinc gluconate 50 MG tablet Take 50 mg by mouth daily.     No facility-administered medications prior to visit.    Allergies  Allergen Reactions  . Penicillins Rash       Objective:    BP 134/85 (BP Location: Left Arm, Patient Position: Sitting, Cuff  Size: Normal)   Pulse 71   Temp (!) 96.6 F (35.9 C) (Temporal)   Ht 5' 4.5" (1.638 m)   Wt 196 lb (88.9 kg)   SpO2 98%   BMI 33.12 kg/m  Wt Readings from Last 3 Encounters:  10/17/20 196 lb (88.9 kg)  09/20/20 200 lb (90.7 kg)  09/09/20 198 lb (89.8 kg)    Physical Exam Constitutional:      Appearance: She is well-developed.  HENT:     Head: Normocephalic and atraumatic.     Right Ear: Hearing, tympanic membrane, ear canal and external ear normal.     Left Ear: Hearing, tympanic membrane, ear canal and external ear normal.     Nose: Nose normal.     Mouth/Throat:     Pharynx: No oropharyngeal exudate.  Eyes:     General: No scleral icterus.       Right eye: No discharge.     Extraocular Movements: Extraocular movements intact.     Conjunctiva/sclera: Conjunctivae normal.     Pupils: Pupils are equal, round, and reactive to light.  Neck:     Thyroid: No thyromegaly.     Trachea: No tracheal deviation.  Cardiovascular:     Rate and Rhythm: Normal rate and regular rhythm.     Heart sounds: Normal heart sounds. No murmur heard.  No friction rub.  Pulmonary:     Effort: Pulmonary effort is normal. No accessory muscle usage or respiratory distress.     Breath sounds: Normal breath sounds. No decreased breath sounds, wheezing, rhonchi or rales.  Abdominal:     General: Bowel sounds are normal. There is no distension.     Palpations: Abdomen is soft. There is no mass.     Tenderness: There is no abdominal tenderness. There is no guarding or rebound.  Musculoskeletal:        General: No tenderness or deformity. Normal range of motion.     Cervical back: Normal range of motion and neck supple.  Lymphadenopathy:     Cervical: No cervical adenopathy.  Skin:    General: Skin is warm and dry.     Findings: No erythema.  Neurological:     Mental Status: She is alert and oriented to person, place, and time.     Cranial Nerves: No cranial nerve deficit.     Coordination:  Coordination normal.     Deep Tendon Reflexes: Reflexes are normal and symmetric.     Reflex Scores:      Patellar reflexes are 2+ on the right side and 2+ on the left side. Psychiatric:        Speech: Speech normal.        Behavior: Behavior normal.        Thought Content: Thought content normal.        Judgment: Judgment normal.  Patient has been counseled extensively about nutrition and exercise as well as the importance of adherence with medications and regular follow-up. The patient was given clear instructions to go to ER or return to medical center if symptoms don't improve, worsen or new problems develop. The patient verbalized understanding.   Follow-up: Return in about 3 months (around 01/17/2021).   Gildardo Pounds, FNP-BC [facility], [place]   10/22/2020, 5:50 PM

## 2020-10-18 LAB — CMP14+EGFR
ALT: 21 IU/L (ref 0–32)
AST: 19 IU/L (ref 0–40)
Albumin/Globulin Ratio: 1.6 (ref 1.2–2.2)
Albumin: 4.4 g/dL (ref 3.8–4.8)
Alkaline Phosphatase: 62 IU/L (ref 44–121)
BUN/Creatinine Ratio: 14 (ref 9–23)
BUN: 15 mg/dL (ref 6–24)
Bilirubin Total: 0.8 mg/dL (ref 0.0–1.2)
CO2: 23 mmol/L (ref 20–29)
Calcium: 9.1 mg/dL (ref 8.7–10.2)
Chloride: 101 mmol/L (ref 96–106)
Creatinine, Ser: 1.1 mg/dL — ABNORMAL HIGH (ref 0.57–1.00)
GFR calc Af Amer: 72 mL/min/{1.73_m2} (ref >59)
GFR calc non Af Amer: 63 mL/min/{1.73_m2} (ref >59)
Globulin, Total: 2.8 g/dL (ref 1.5–4.5)
Glucose: 81 mg/dL (ref 65–99)
Potassium: 4 mmol/L (ref 3.5–5.2)
Sodium: 138 mmol/L (ref 134–144)
Total Protein: 7.2 g/dL (ref 6.0–8.5)

## 2020-10-18 LAB — LIPID PANEL
Chol/HDL Ratio: 3 ratio (ref 0.0–4.4)
Cholesterol, Total: 180 mg/dL (ref 100–199)
HDL: 60 mg/dL (ref >39)
LDL Chol Calc (NIH): 105 mg/dL — ABNORMAL HIGH (ref 0–99)
Triglycerides: 83 mg/dL (ref 0–149)
VLDL Cholesterol Cal: 15 mg/dL (ref 5–40)

## 2020-10-18 LAB — CBC
Hematocrit: 41.9 % (ref 34.0–46.6)
Hemoglobin: 14.3 g/dL (ref 11.1–15.9)
MCH: 31.5 pg (ref 26.6–33.0)
MCHC: 34.1 g/dL (ref 31.5–35.7)
MCV: 92 fL (ref 79–97)
Platelets: 203 10*3/uL (ref 150–450)
RBC: 4.54 x10E6/uL (ref 3.77–5.28)
RDW: 12.3 % (ref 11.7–15.4)
WBC: 5.4 10*3/uL (ref 3.4–10.8)

## 2020-10-18 LAB — VITAMIN D 25 HYDROXY (VIT D DEFICIENCY, FRACTURES): Vit D, 25-Hydroxy: 45.3 ng/mL (ref 30.0–100.0)

## 2020-10-21 ENCOUNTER — Other Ambulatory Visit: Payer: Self-pay

## 2020-10-21 ENCOUNTER — Other Ambulatory Visit: Payer: Self-pay | Admitting: Nurse Practitioner

## 2020-10-21 ENCOUNTER — Ambulatory Visit: Payer: No Typology Code available for payment source | Admitting: Physical Therapy

## 2020-10-21 ENCOUNTER — Encounter: Payer: Self-pay | Admitting: Physical Therapy

## 2020-10-21 DIAGNOSIS — M545 Low back pain, unspecified: Secondary | ICD-10-CM | POA: Diagnosis not present

## 2020-10-21 DIAGNOSIS — G8929 Other chronic pain: Secondary | ICD-10-CM

## 2020-10-21 DIAGNOSIS — M793 Panniculitis, unspecified: Secondary | ICD-10-CM

## 2020-10-21 NOTE — Patient Instructions (Signed)
Access Code: [email or web address] URL: [email or web address] Date: 10/21/2020 Prepared by: Hilda Blades  Exercises Supine Bridge with Gluteal Set and Spinal Articulation - 1 x daily - 5 x weekly - 2-3 sets - 10 reps - 3 seconds hold Supine March with Resistance Band - 1 x daily - 5 x weekly - 2-3 sets - 10 reps Hooklying Clamshell with Resistance - 1 x daily - 5 x weekly - 2-3 sets - 10 reps Squat with Chair Touch - 1 x daily - 5 x weekly - 2-3 sets - 10 reps Cat-Camel - 1 x daily - 5 x weekly - 1 sets - 10 reps - 5 seconds hold Bird Dog - 1 x daily - 5 x weekly - 2-3 sets - 10 reps - 3 seconds hold Child's Pose Stretch - 1 x daily - 5 x weekly - 2-3 reps - 20 seconds hold Seated Hamstring Stretch - 1 x daily - 5 x weekly - 2-3 reps - 20 seconds hold
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
## 2020-10-22 ENCOUNTER — Encounter: Payer: Self-pay | Admitting: Nurse Practitioner

## 2020-10-24 ENCOUNTER — Encounter: Payer: Self-pay | Admitting: Nurse Practitioner

## 2020-10-25 ENCOUNTER — Ambulatory Visit: Payer: No Typology Code available for payment source | Admitting: Physical Therapy

## 2020-10-25 ENCOUNTER — Encounter: Payer: Self-pay | Admitting: Physical Therapy

## 2020-10-25 ENCOUNTER — Other Ambulatory Visit: Payer: Self-pay

## 2020-10-25 DIAGNOSIS — M545 Low back pain, unspecified: Secondary | ICD-10-CM

## 2020-10-25 DIAGNOSIS — G8929 Other chronic pain: Secondary | ICD-10-CM

## 2020-10-25 NOTE — Therapy (Signed)
[place], [place], [address] Phone: [phone]   Fax:  [phone]  Physical Therapy Treatment  Patient Details  Name: Ashley Summers MRN: [id number] Date of Birth: [birth year] Referring Provider (PT): Audelia Hives, DO   Encounter Date: 10/25/2020   PT End of Session - 10/25/20 1713    Visit Number 3    Number of Visits 8    Date for PT Re-Evaluation 11/18/20    Authorization Type [facility] [employer], recheck FOTO status by visit 6    PT Start Time 1658    PT Stop Time 1730   patient requested to leave early   PT Time Calculation (min) 32 min    Activity Tolerance Patient tolerated treatment well    Behavior During Therapy [facility] for tasks assessed/performed           Past Medical History:  Diagnosis Date   Allergy    Anxiety    Essential hypertension 09/09/2020   Hypertension    Palpitations 09/09/2020    Past Surgical History:  Procedure Laterality Date   BREAST SURGERY     Reduction    Hammertoe correction      There were no vitals filed for this visit.   Subjective Assessment - 10/25/20 1705    Subjective Patient reports she is doing well, denies any pain currently. States she was a little achy earlier but once she moved around she started feeling better.    Patient Stated Goals Build core strength    Currently in Pain? No/denies              [facility] PT Assessment - 10/25/20 0001      AROM   Lumbar Flexion WFL - no pain    Lumbar Extension WFL - increased right lower back discomfort                         [facility] Adult PT Treatment/Exercise - 10/25/20 0001      Exercises   Exercises Lumbar      Lumbar Exercises: Stretches   Lower Trunk Rotation 5 reps    Lower Trunk Rotation Limitations figure-4      Lumbar Exercises: Aerobic   Nustep L5 x 5 min (UE and LE)      Lumbar Exercises: Standing   Lifting 10 reps   2 sets   Lifting Weights (lbs) 10     Lifting Limitations deadlift from 8"      Lumbar Exercises: Supine   Bridge 10 reps;3 seconds   2 sets     Lumbar Exercises: Quadruped   Madcat/Old Horse 10 reps    Opposite Arm/Leg Raise 10 reps;3 seconds   2 sets   Opposite Arm/Leg Raise Limitations small ball placed on lower back for cue to maintain neutral spine      Manual Therapy   Manual Therapy Joint mobilization    Joint Mobilization CPA and right UPA for lower lumbar region                  PT Education - 10/25/20 1712    Education Details HEP    Person(s) Educated Patient    Methods Explanation    Comprehension Verbalized understanding;Need further instruction               PT Long Term Goals - 10/07/20 0819      PT LONG TERM GOAL #1   Title Independent with HEP  Baseline needs HEP    Time 6    Period Weeks    Status New    Target Date 11/18/20      PT LONG TERM GOAL #2   Title Improve FOTO outcome measure score to 25% or less impairment    Baseline 35% limited    Time 6    Period Weeks    Status New    Target Date 11/18/20      PT LONG TERM GOAL #3   Title Tolerate sitting for work, eating meals, car travel for periods at least 30-40 minutes with LBP 2/10 or less    Baseline 4/10 pain at eval increased with prolonged sitting    Time 6    Period Weeks    Status New    Target Date 11/18/20      PT LONG TERM GOAL #4   Title Increase bilateral hip abduction and extension strength at least 1/2 MMT grade to improve lumbopelvic stability to help decrease LBP with prolonged positioning    Baseline 4/5    Time 6    Period Weeks    Status New    Target Date 11/18/20      PT LONG TERM GOAL #5   Title Return demos for body mechanics for sitting posture, lifting mechanics    Time 6    Period Weeks    Status New    Target Date 11/18/20                 Plan - 10/25/20 1714    Clinical Impression Statement Patient tolerated therapy well with no adverse effects. Patient reported  increaed right sided lower back pain with extension so used manual to work on right sided mobility and she reported improvement following. Progressed patient's core/hip strengthening this visit with good tolerance. Incorporated lifting technique and training, she did require cueing for proper lumbopelvic control. She would benefit from continued skilled PT to progress lumbar mobility and core/hip strength to reduce and maximize functional level.    PT Treatment/Interventions ADLs/Self Care Home Management;Cryotherapy;Electrical Stimulation;Moist Heat;Taping;Therapeutic exercise;Functional mobility training;Neuromuscular re-education;Therapeutic activities;Patient/family education;Manual techniques;Spinal Manipulations    PT Next Visit Plan Review HEP as needed, focus core/back and hip strengthening, extension bias ROM, stretch hamstrings and piriformis, lower lumbar STM/manual/PAs prn, pt. does not wish to try dry needling    PT Home Exercise Plan [email or web address]    Consulted and Agree with Plan of Care Patient           Patient will benefit from skilled therapeutic intervention in order to improve the following deficits and impairments:  Pain, Impaired flexibility, Decreased strength, Difficulty walking, Obesity  Visit Diagnosis: Chronic midline low back pain without sciatica     Problem List Patient Active Problem List   Diagnosis Date Noted   Panniculitis 09/20/2020   Essential hypertension 09/09/2020   Palpitations 09/09/2020   OSA (obstructive sleep apnea) 04/23/2018   Hemorrhoid 02/03/2016   IUD check up 02/03/2016    Hilda Blades, PT, DPT, LAT, ATC 10/25/20  5:34 PM Phone: [phone] Fax: [place] [facility] [address] [place], [place], [address] Phone: [phone]   Fax:  [phone]  Name: Ashley Summers MRN: [id number] Date of Birth: 1981/05/13

## 2020-10-26 ENCOUNTER — Ambulatory Visit: Payer: No Typology Code available for payment source | Admitting: Physical Therapy

## 2020-11-02 ENCOUNTER — Other Ambulatory Visit: Payer: Self-pay

## 2020-11-02 ENCOUNTER — Encounter: Payer: Self-pay | Admitting: Physical Therapy

## 2020-11-02 ENCOUNTER — Ambulatory Visit: Payer: No Typology Code available for payment source | Attending: Plastic Surgery | Admitting: Physical Therapy

## 2020-11-02 ENCOUNTER — Ambulatory Visit: Payer: No Typology Code available for payment source | Admitting: Physical Therapy

## 2020-11-02 DIAGNOSIS — G8929 Other chronic pain: Secondary | ICD-10-CM | POA: Insufficient documentation

## 2020-11-02 DIAGNOSIS — M545 Low back pain, unspecified: Secondary | ICD-10-CM | POA: Insufficient documentation

## 2020-11-02 NOTE — Therapy (Signed)
[place], [place], [address] Phone: [phone]   Fax:  [phone]  Physical Therapy Treatment  Patient Details  Name: Ashley Summers MRN: [id number] Date of Birth: [birth year] Referring Provider (PT): Audelia Hives, DO   Encounter Date: 11/02/2020   PT End of Session - 11/02/20 1731    Visit Number 4    Number of Visits 8    Date for PT Re-Evaluation 11/18/20    Authorization Type [facility] [employer], recheck FOTO status by visit 6    PT Start Time 1705    PT Stop Time 6962    PT Time Calculation (min) 40 min    Activity Tolerance Patient tolerated treatment well    Behavior During Therapy [facility] for tasks assessed/performed           Past Medical History:  Diagnosis Date   Allergy    Anxiety    Essential hypertension 09/09/2020   Hypertension    Palpitations 09/09/2020    Past Surgical History:  Procedure Laterality Date   BREAST SURGERY     Reduction    Hammertoe correction      There were no vitals filed for this visit.   Subjective Assessment - 11/02/20 1708    Subjective Patient reports she is doing well with no new issues. States exercises are going well.    Patient Stated Goals Build core strength    Currently in Pain? No/denies                             [facility] Adult PT Treatment/Exercise - 11/02/20 0001      Exercises   Exercises Lumbar      Lumbar Exercises: Stretches   Lower Trunk Rotation 5 reps;10 seconds    Lower Trunk Rotation Limitations figure-4      Lumbar Exercises: Aerobic   Nustep L5 x 5 min (UE and LE)      Lumbar Exercises: Standing   Lifting 10 reps   2 sets   Lifting Weights (lbs) 15    Lifting Limitations deadlift from 8"    Other Standing Lumbar Exercises Pallof press uisng 7# FM 2 x 10      Lumbar Exercises: Seated   Sit to Stand 10 reps   2 sets   Sit to Stand Limitations focus on hip hinge technique maintaining neutral  spine      Lumbar Exercises: Supine   Bridge 10 reps;3 seconds    Bridge with March 5 reps   2 sets   Other Supine Lumbar Exercises 90-90 alternating heel taps 3 x 10      Lumbar Exercises: Sidelying   Hip Abduction 15 reps   2 sets                 PT Education - 11/02/20 1731    Education Details HEP    Person(s) Educated Patient    Methods Explanation    Comprehension Verbalized understanding;Need further instruction               PT Long Term Goals - 10/07/20 0819      PT LONG TERM GOAL #1   Title Independent with HEP    Baseline needs HEP    Time 6    Period Weeks    Status New    Target Date 11/18/20      PT LONG TERM GOAL #2   Title Improve FOTO outcome  measure score to 25% or less impairment    Baseline 35% limited    Time 6    Period Weeks    Status New    Target Date 11/18/20      PT LONG TERM GOAL #3   Title Tolerate sitting for work, eating meals, car travel for periods at least 30-40 minutes with LBP 2/10 or less    Baseline 4/10 pain at eval increased with prolonged sitting    Time 6    Period Weeks    Status New    Target Date 11/18/20      PT LONG TERM GOAL #4   Title Increase bilateral hip abduction and extension strength at least 1/2 MMT grade to improve lumbopelvic stability to help decrease LBP with prolonged positioning    Baseline 4/5    Time 6    Period Weeks    Status New    Target Date 11/18/20      PT LONG TERM GOAL #5   Title Return demos for body mechanics for sitting posture, lifting mechanics    Time 6    Period Weeks    Status New    Target Date 11/18/20                 Plan - 11/02/20 1731    Clinical Impression Statement Patient tolerated therapy well with no adverse effects. Therapy focused on progressing core and hip strengthening this visit with good tolerance. She did not report any increase in pain this visit but did report muscle burning and fatgue. No change to HEP this visit, assess patient's  response to progression in strengthening this visit and modify HEP appropriately next visit. She would benefit from continued skilled PT to progress lumbar mobility and core/hip strength to reduce and maximize functional level.    PT Treatment/Interventions ADLs/Self Care Home Management;Cryotherapy;Electrical Stimulation;Moist Heat;Taping;Therapeutic exercise;Functional mobility training;Neuromuscular re-education;Therapeutic activities;Patient/family education;Manual techniques;Spinal Manipulations    PT Next Visit Plan Review HEP as needed, focus core/back and hip strengthening, lower lumbar STM/manual/PAs prn, pt. does not wish to try dry needling    PT Home Exercise Plan [id number]    Consulted and Agree with Plan of Care Patient           Patient will benefit from skilled therapeutic intervention in order to improve the following deficits and impairments:  Pain, Impaired flexibility, Decreased strength, Difficulty walking, Obesity  Visit Diagnosis: Chronic midline low back pain without sciatica     Problem List Patient Active Problem List   Diagnosis Date Noted   Panniculitis 09/20/2020   Essential hypertension 09/09/2020   Palpitations 09/09/2020   OSA (obstructive sleep apnea) 04/23/2018   Hemorrhoid 02/03/2016   IUD check up 02/03/2016    Hilda Blades, PT, DPT, LAT, ATC 11/02/20  5:47 PM Phone: [phone] Fax: [facility] [address] [place], [place], [address] Phone: [phone]   Fax:  [phone]  Name: Ashley Summers MRN: [id number] Date of Birth: [birth year]

## 2020-11-03 ENCOUNTER — Encounter: Payer: Self-pay | Admitting: Obstetrics and Gynecology

## 2020-11-03 ENCOUNTER — Other Ambulatory Visit: Payer: Self-pay | Admitting: Obstetrics and Gynecology

## 2020-11-03 ENCOUNTER — Ambulatory Visit (INDEPENDENT_AMBULATORY_CARE_PROVIDER_SITE_OTHER): Payer: No Typology Code available for payment source | Admitting: Obstetrics and Gynecology

## 2020-11-03 ENCOUNTER — Other Ambulatory Visit (HOSPITAL_COMMUNITY)
Admission: RE | Admit: 2020-11-03 | Discharge: 2020-11-03 | Disposition: A | Discharge status: Home / Self Care | Payer: No Typology Code available for payment source | Source: Ambulatory Visit | Attending: Obstetrics and Gynecology | Admitting: Obstetrics and Gynecology

## 2020-11-03 ENCOUNTER — Ambulatory Visit: Payer: No Typology Code available for payment source | Admitting: Physical Therapy

## 2020-11-03 VITALS — BP 144/84 | HR 59 | Ht 64.0 in | Wt 200.0 lb

## 2020-11-03 DIAGNOSIS — Z01419 Encounter for gynecological examination (general) (routine) without abnormal findings: Secondary | ICD-10-CM

## 2020-11-03 MED ORDER — NYSTATIN 100000 UNIT/GM EX CREA
1.0000 | TOPICAL_CREAM | Freq: Two times a day (BID) | CUTANEOUS | 3 refills | Status: DC
Start: 2020-11-03 — End: 2021-03-01

## 2020-11-03 MED FILL — NYSTATIN 100,000 UNIT/GM CR: 100000 | 14 days supply | Qty: 30 | Fill #0

## 2020-11-03 NOTE — Progress Notes (Signed)
Patient presents for Annual Exam today.  Mammogram: 11/02/19 WNL  Last pap: 10/26/2019  STD Screening: Full panel  Denies any Family Hx of Breast Cancer. Contraception: IUD  Removed Re-inserted 10/26/19  CC:  None

## 2020-11-03 NOTE — Progress Notes (Signed)
Subjective:     Ashley Summers is a 39 y.o. female P3 with BMI 34 who is here for a comprehensive physical exam. The patient reports no problems. Patient with levonorgestrel IUD in place since 10/2019 and amenorrhea. Patient is sexually active without complaints. She denies pelvic pain or abnormal discharge. Patient denies urinary incontinence.  Past Medical History:  Diagnosis Date  . Allergy   . Anxiety   . Essential hypertension 09/09/2020  . Hypertension   . Palpitations 09/09/2020   Past Surgical History:  Procedure Laterality Date  . BREAST SURGERY     Reduction   . Hammertoe correction     Family History  Problem Relation Age of Onset  . Hypertension Mother   . Stroke Mother   . Pulmonary embolism Mother   . Hypertension Brother   . Hyperlipidemia Maternal Grandmother   . Hypertension Maternal Grandmother     Social History   Socioeconomic History  . Marital status: Single    Spouse name: Not on file  . Number of children: Not on file  . Years of education: Not on file  . Highest education level: Not on file  Occupational History  . Not on file  Tobacco Use  . Smoking status: Never Smoker  . Smokeless tobacco: Never Used  Vaping Use  . Vaping Use: Never used  Substance and Sexual Activity  . Alcohol use: Yes    Alcohol/week: 0.0 standard drinks    Comment: occasional  . Drug use: No  . Sexual activity: Yes    Partners: Male    Birth control/protection: I.U.D.  Other Topics Concern  . Not on file  Social History Narrative   Work or School: Chartered certified accountant, [employer]      Home Situation: lives with 3 kids (17,15, 9)      Spiritual Beliefs: none      Lifestyle: diet is so so; has been trying to exercising            Social Determinants of Health   Financial Resource Strain:   . Difficulty of Paying Living Expenses: Not on file  Food Insecurity:   . Worried About Charity fundraiser in the Last Year: Not on file  . Ran Out of Food  in the Last Year: Not on file  Transportation Needs:   . Lack of Transportation (Medical): Not on file  . Lack of Transportation (Non-Medical): Not on file  Physical Activity:   . Days of Exercise per Week: Not on file  . Minutes of Exercise per Session: Not on file  Stress:   . Feeling of Stress : Not on file  Social Connections:   . Frequency of Communication with Friends and Family: Not on file  . Frequency of Social Gatherings with Friends and Family: Not on file  . Attends Religious Services: Not on file  . Active Member of Clubs or Organizations: Not on file  . Attends Archivist Meetings: Not on file  . Marital Status: Not on file  Intimate Partner Violence:   . Fear of Current or Ex-Partner: Not on file  . Emotionally Abused: Not on file  . Physically Abused: Not on file  . Sexually Abused: Not on file   Health Maintenance  Topic Date Due  . MAMMOGRAM  11/01/2020  . INFLUENZA VACCINE  03/02/2021 (Originally 07/03/2020)  . PAP SMEAR-Modifier  10/25/2022  . TETANUS/TDAP  10/11/2024  . COVID-19 Vaccine  Completed  . Hepatitis C Screening  Completed  . HIV Screening  Completed       Review of Systems Pertinent items noted in HPI and remainder of comprehensive ROS otherwise negative.   Objective:  Blood pressure (!) 144/84, pulse (!) 59, height 5\' 4"  (1.626 m), weight 200 lb (90.7 kg).     GENERAL: Well-developed, well-nourished female in no acute distress.  HEENT: Normocephalic, atraumatic. Sclerae anicteric.  NECK: Supple. Normal thyroid.  LUNGS: Clear to auscultation bilaterally.  HEART: Regular rate and rhythm. BREASTS: Symmetric in size. No palpable masses or lymphadenopathy, skin changes, or nipple drainage. ABDOMEN: Soft, nontender, nondistended. No organomegaly. PELVIC: Normal external female genitalia. Vagina is pink and rugated.  Normal discharge. Normal appearing cervix with IUD strings visualized. Uterus is normal in size.  No adnexal mass or  tenderness. EXTREMITIES: No cyanosis, clubbing, or edema, 2+ distal pulses.    Assessment:    Healthy female exam.      Plan:    Pap smear collected Screening mammogram scheduled on 12/17 STD screening per patient request See After Visit Summary for Counseling Recommendations

## 2020-11-04 LAB — CERVICOVAGINAL ANCILLARY ONLY
Bacterial Vaginitis (gardnerella): NEGATIVE
Candida Glabrata: NEGATIVE
Candida Vaginitis: NEGATIVE
Chlamydia: NEGATIVE
Comment: NEGATIVE
Comment: NEGATIVE
Comment: NEGATIVE
Comment: NEGATIVE
Comment: NEGATIVE
Comment: NORMAL
Neisseria Gonorrhea: NEGATIVE
Trichomonas: NEGATIVE

## 2020-11-04 LAB — HEPATITIS C ANTIBODY: Hep C Virus Ab: 0.2 s/co ratio (ref 0.0–0.9)

## 2020-11-04 LAB — HIV ANTIBODY (ROUTINE TESTING W REFLEX): HIV Screen 4th Generation wRfx: NONREACTIVE

## 2020-11-04 LAB — HEPATITIS B SURFACE ANTIGEN: Hepatitis B Surface Ag: NEGATIVE

## 2020-11-04 LAB — RPR: RPR Ser Ql: NONREACTIVE

## 2020-11-08 ENCOUNTER — Encounter: Payer: No Typology Code available for payment source | Admitting: Physical Therapy

## 2020-11-11 ENCOUNTER — Ambulatory Visit: Payer: No Typology Code available for payment source

## 2020-11-11 ENCOUNTER — Ambulatory Visit: Payer: No Typology Code available for payment source | Admitting: Physical Therapy

## 2020-11-11 ENCOUNTER — Encounter: Payer: Self-pay | Admitting: Physical Therapy

## 2020-11-11 ENCOUNTER — Other Ambulatory Visit: Payer: Self-pay

## 2020-11-11 DIAGNOSIS — M545 Low back pain, unspecified: Secondary | ICD-10-CM | POA: Diagnosis not present

## 2020-11-11 DIAGNOSIS — G8929 Other chronic pain: Secondary | ICD-10-CM

## 2020-11-11 LAB — CYTOLOGY - PAP
Adequacy: ABSENT
Diagnosis: NEGATIVE

## 2020-11-11 NOTE — Therapy (Signed)
[place], [place], [address] Phone: [phone]   Fax:  [phone]  Physical Therapy Treatment  Patient Details  Name: Ashley Summers MRN: [id number] Date of Birth: 1981-11-29 Referring Provider (PT): Audelia Hives, DO   Encounter Date: 11/11/2020   PT End of Session - 11/11/20 0732    Visit Number 5    Number of Visits 8    Date for PT Re-Evaluation 11/18/20    Authorization Type [facility] [employer], recheck FOTO status by visit 6    PT Start Time 1728   pt. arrived late   PT Stop Time 1759    PT Time Calculation (min) 31 min    Activity Tolerance Patient tolerated treatment well    Behavior During Therapy [facility] for tasks assessed/performed           Past Medical History:  Diagnosis Date  . Allergy   . Anxiety   . Essential hypertension 09/09/2020  . Hypertension   . Palpitations 09/09/2020    Past Surgical History:  Procedure Laterality Date  . BREAST SURGERY     Reduction   . Hammertoe correction      There were no vitals filed for this visit.   Subjective Assessment - 11/11/20 0729    Subjective No pain this AM. Pt. is in the process of moving-has done a little lifting and so far back has done OK but did wear a back brace with lifting. Still with some intermittent aching and throbbing in low back and notes AM stiffness but back pain "subdueing" with activity during the day.    Pertinent History 8 year history chronic LBP    Currently in Pain? No/denies                             [facility] Adult PT Treatment/Exercise - 11/11/20 0001      Lumbar Exercises: Aerobic   Nustep L5 x 5 min (UE and LE)      Lumbar Exercises: Standing   Lifting 20 reps    Lifting Weights (lbs) 15    Lifting Limitations 2x10 deadlift from 8 in. step    Row Limitations Freemotion cable row 7 lb. bilat. UE 2x10    Shoulder Extension Limitations Freemotion cable extension 3 lbs. bilat. 2x10     Other Standing Lumbar Exercises Pallof press using 7# FM 2 x 10   bilat.     Lumbar Exercises: Supine   Bridge with clamshell 20 reps   2x10 with green band   Bridge with March 5 reps   2 sets   Other Supine Lumbar Exercises 90-90 alternating heel taps 3 x 10      Lumbar Exercises: Sidelying   Hip Abduction 15 reps   2 sets     Lumbar Exercises: Quadruped   Opposite Arm/Leg Raise 15 reps      Manual Therapy   Manual Therapy Soft tissue mobilization    Joint Mobilization brief lumbar CPAs grade I-II at L4-5 region-soreness but no ease with continued oscillations so held    Soft tissue mobilization checked lumbar paraspinals with brief STM but no significant soreness/findings noted so held                  PT Education - 11/11/20 0802    Education Details POC    Person(s) Educated Patient    Methods Explanation    Comprehension Verbalized understanding  PT Long Term Goals - 10/07/20 0819      PT LONG TERM GOAL #1   Title Independent with HEP    Baseline needs HEP    Time 6    Period Weeks    Status New    Target Date 11/18/20      PT LONG TERM GOAL #2   Title Improve FOTO outcome measure score to 25% or less impairment    Baseline 35% limited    Time 6    Period Weeks    Status New    Target Date 11/18/20      PT LONG TERM GOAL #3   Title Tolerate sitting for work, eating meals, car travel for periods at least 30-40 minutes with LBP 2/10 or less    Baseline 4/10 pain at eval increased with prolonged sitting    Time 6    Period Weeks    Status New    Target Date 11/18/20      PT LONG TERM GOAL #4   Title Increase bilateral hip abduction and extension strength at least 1/2 MMT grade to improve lumbopelvic stability to help decrease LBP with prolonged positioning    Baseline 4/5    Time 6    Period Weeks    Status New    Target Date 11/18/20      PT LONG TERM GOAL #5   Title Return demos for body mechanics for sitting posture,  lifting mechanics    Time 6    Period Weeks    Status New    Target Date 11/18/20                 Plan - 11/11/20 0802    Clinical Impression Statement Continued focus lumbar/hip and core strengthening with inclusion standing strengthening/functional lifting activities. Some fatigue with exercises but well-tolerated in trems of pain. Attempted brief manual no significant tenderness with STM and no symptom ease noted with CPAs so held (manual). Pt. will follow up next week for 6th visit at which point will recheck FOTO and determine further status for d/c from therapy vs. recertification pending need for further formal visits.    Personal Factors and Comorbidities Time since onset of injury/illness/exacerbation    Examination-Activity Limitations Bend;Lift;Sit;Stand;Locomotion Level    Examination-Participation Restrictions Community Activity;Occupation    Stability/Clinical Decision Making Stable/Uncomplicated    Clinical Decision Making Low    Rehab Potential Good    PT Duration 6 weeks    PT Treatment/Interventions ADLs/Self Care Home Management;Cryotherapy;Electrical Stimulation;Moist Heat;Taping;Therapeutic exercise;Functional mobility training;Neuromuscular re-education;Therapeutic activities;Patient/family education;Manual techniques;Spinal Manipulations    PT Next Visit Plan Recheck FOTO, recert vs. d/c, update HEP, progress exercises as tolerated    PT Home Exercise Plan [id number]    Consulted and Agree with Plan of Care Patient           Patient will benefit from skilled therapeutic intervention in order to improve the following deficits and impairments:  Pain,Impaired flexibility,Decreased strength,Difficulty walking,Obesity  Visit Diagnosis: Chronic midline low back pain without sciatica     Problem List Patient Active Problem List   Diagnosis Date Noted  . Panniculitis 09/20/2020  . Essential hypertension 09/09/2020  . Palpitations 09/09/2020  . OSA  (obstructive sleep apnea) 04/23/2018  . Hemorrhoid 02/03/2016  . IUD check up 02/03/2016    Beaulah Dinning, PT, DPT 11/11/20 8:10 AM  [facility] [address] [place], [place], [address] Phone: [phone]   Fax:  [phone]  Name: Ashley Summers MRN: [id number] Date of Birth: [birth year]

## 2020-11-15 ENCOUNTER — Telehealth: Payer: Self-pay

## 2020-11-15 NOTE — Telephone Encounter (Signed)
[employer] PA approved until 11/14/21

## 2020-11-18 ENCOUNTER — Encounter: Payer: Self-pay | Admitting: Physical Therapy

## 2020-11-18 ENCOUNTER — Ambulatory Visit
Admission: RE | Admit: 2020-11-18 | Discharge: 2020-11-18 | Disposition: A | Discharge status: Home / Self Care | Payer: No Typology Code available for payment source | Source: Ambulatory Visit | Attending: Obstetrics and Gynecology | Admitting: Obstetrics and Gynecology

## 2020-11-18 ENCOUNTER — Ambulatory Visit: Payer: No Typology Code available for payment source

## 2020-11-18 ENCOUNTER — Ambulatory Visit: Payer: No Typology Code available for payment source | Admitting: Physical Therapy

## 2020-11-18 ENCOUNTER — Other Ambulatory Visit: Payer: Self-pay

## 2020-11-18 DIAGNOSIS — M545 Low back pain, unspecified: Secondary | ICD-10-CM | POA: Diagnosis not present

## 2020-11-18 DIAGNOSIS — G8929 Other chronic pain: Secondary | ICD-10-CM

## 2020-11-18 DIAGNOSIS — Z1231 Encounter for screening mammogram for malignant neoplasm of breast: Secondary | ICD-10-CM

## 2020-11-18 NOTE — Therapy (Signed)
[place], [place], [address] Phone: [phone]   Fax:  [phone]  Physical Therapy Treatment / Discharge  Patient Details  Name: Ashley Summers MRN: [id number] Date of Birth: 06/02/1981 Referring Provider (PT): Audelia Hives, DO   Encounter Date: 11/18/2020   PT End of Session - 11/18/20 0752    Visit Number 6    Number of Visits 8    Date for PT Re-Evaluation 11/18/20    Authorization Type [facility] [employer]    PT Start Time 0745    PT Stop Time 0830    PT Time Calculation (min) 45 min    Activity Tolerance Patient tolerated treatment well    Behavior During Therapy [facility] for tasks assessed/performed           Past Medical History:  Diagnosis Date  . Allergy   . Anxiety   . Essential hypertension 09/09/2020  . Hypertension   . Palpitations 09/09/2020    Past Surgical History:  Procedure Laterality Date  . BREAST SURGERY     Reduction   . Hammertoe correction      There were no vitals filed for this visit.   Subjective Assessment - 11/18/20 0750    Subjective Patient report normal stiffness this morning. She does  feel like she loosens up as she moves around more. She is finished moving and it went ok, she didn't do much heavy lifting.    Limitations Sitting;Lifting;Standing;Walking    How long can you sit comfortably? 30-40 minutes results in 3/10 pain    Patient Stated Goals Build core strength    Currently in Pain? Yes    Pain Score 2     Pain Location Back    Pain Orientation Lower    Pain Descriptors / Indicators Tightness    Pain Type Chronic pain    Pain Onset More than a month ago    Pain Frequency Constant              [facility] PT Assessment - 11/18/20 0001      Assessment   Medical Diagnosis LBP, panniculitis    Referring Provider (PT) Audelia Hives, DO      Precautions   Precautions None      Restrictions   Weight Bearing Restrictions No      Prior  Function   Level of Independence Independent    Leisure None reported      Cognition   Overall Cognitive Status Within Functional Limits for tasks assessed      Observation/Other Assessments   Focus on Therapeutic Outcomes (FOTO)  17% limitation      Functional Tests   Functional tests Squat      Squat   Comments Patient exhibits adequate lifting mechanics maintaining lumbopelvic control      Posture/Postural Control   Posture Comments Patient able to demonstrate appropriate seated posture, with extended periods of sitting tends to return to generally slouched/rounded posture      AROM   Overall AROM Comments AROM grossly WFL for lumbar - slight discomfort into extension      Strength   Right Hip Extension 4+/5    Right Hip ABduction 4+/5    Left Hip Extension 4+/5    Left Hip ABduction 4+/5      Palpation   Palpation comment Non-TTP      Transfers   Transfers Independent with all Transfers  [place] Adult PT Treatment/Exercise - 11/18/20 0001      Self-Care   Self-Care Other Self-Care Comments    Other Self-Care Comments  POC, FOTO, HEP      Exercises   Exercises Lumbar      Lumbar Exercises: Seated   Sit to Stand 10 reps   2 sets     Lumbar Exercises: Supine   Clam 10 reps;3 seconds   2 sets   Clam Limitations unilateral with blue    Bent Knee Raise 20 reps   2 sets   Bent Knee Raise Limitations blue bamd    Bridge 10 reps;3 seconds   2 sets     Lumbar Exercises: Quadruped   [place]/Old Horse 5 reps   2 sets   Opposite Arm/Leg Raise 10 reps;3 seconds   2 sets                 PT Education - 11/18/20 0751    Education Details POC, HEP    Person(s) Educated Patient    Methods Explanation    Comprehension Verbalized understanding               PT Long Term Goals - 11/18/20 0752      PT LONG TERM GOAL #1   Title Independent with HEP    Baseline Patient expresses consistency and demonstrates  independency    Time 6    Period Weeks    Status Achieved      PT LONG TERM GOAL #2   Title Improve FOTO outcome measure score to 25% or less impairment    Baseline 17% limited    Time 6    Period Weeks    Status Achieved      PT LONG TERM GOAL #3   Title Tolerate sitting for work, eating meals, car travel for periods at least 30-40 minutes with LBP 2/10 or less    Baseline Patient reports 3/10    Time 6    Period Weeks    Status Partially Met      PT LONG TERM GOAL #4   Title Increase bilateral hip abduction and extension strength at least 1/2 MMT grade to improve lumbopelvic stability to help decrease LBP with prolonged positioning    Baseline 4+/5 MMT for gluteal strength    Time 6    Period Weeks    Status Achieved      PT LONG TERM GOAL #5   Title Return demos for body mechanics for sitting posture, lifting mechanics    Baseline Patient exhibits proper posture and mechanics this visit    Time 6    Period Weeks    Status Achieved                 Plan - 11/18/20 0802    Clinical Impression Statement Patient has achieved all established goals except tolerance for extended periods of sitting which she continues to have increased discomfort. Her functional status has improved greatly per FOTO and she is independent with her HEP. Patient will be discharged from PT.    PT Treatment/Interventions ADLs/Self Care Home Management;Cryotherapy;Electrical Stimulation;Moist Heat;Taping;Therapeutic exercise;Functional mobility training;Neuromuscular re-education;Therapeutic activities;Patient/family education;Manual techniques;Spinal Manipulations    PT Next Visit Plan NA - discharge    PT Home Exercise Plan [id number]    Consulted and Agree with Plan of Care Patient           Patient will benefit from skilled therapeutic intervention in order to improve the following deficits and impairments:  Pain,Impaired flexibility,Decreased strength,Difficulty walking,Obesity  Visit  Diagnosis: Chronic midline low back pain without sciatica     Problem List Patient Active Problem List   Diagnosis Date Noted  . Panniculitis 09/20/2020  . Essential hypertension 09/09/2020  . Palpitations 09/09/2020  . OSA (obstructive sleep apnea) 04/23/2018  . Hemorrhoid 02/03/2016  . IUD check up 02/03/2016    Hilda Blades, PT, DPT, LAT, ATC 11/18/20  8:31 AM Phone: [phone] Fax: [facility] [address] [place], [place], [address] Phone: [phone]   Fax:  [phone]  Name: Shizuko Wojdyla MRN: [id number] Date of Birth: 1981-11-26    PHYSICAL THERAPY DISCHARGE SUMMARY  Visits from Start of Care: 6  Current functional level related to goals / functional outcomes: See above   Remaining deficits: See above   Education / Equipment: HEP Plan: Patient agrees to discharge.  Patient goals were partially met. Patient is being discharged due to being pleased with the current functional level.  ?????

## 2020-11-22 ENCOUNTER — Encounter: Payer: Self-pay | Admitting: Physical Therapy

## 2020-11-23 ENCOUNTER — Encounter: Payer: 59 | Admitting: Family Medicine

## 2020-11-24 ENCOUNTER — Other Ambulatory Visit: Payer: Self-pay

## 2020-11-24 DIAGNOSIS — Z20822 Contact with and (suspected) exposure to covid-19: Secondary | ICD-10-CM

## 2020-11-26 LAB — NOVEL CORONAVIRUS, NAA: SARS-CoV-2, NAA: NOT DETECTED

## 2020-11-26 LAB — SARS-COV-2, NAA 2 DAY TAT

## 2020-11-29 ENCOUNTER — Other Ambulatory Visit: Payer: 59

## 2020-11-29 ENCOUNTER — Other Ambulatory Visit: Payer: Self-pay | Admitting: Nurse Practitioner

## 2020-11-29 ENCOUNTER — Other Ambulatory Visit: Payer: Self-pay

## 2020-11-29 ENCOUNTER — Telehealth: Payer: Self-pay | Admitting: Nurse Practitioner

## 2020-11-29 DIAGNOSIS — Z20822 Contact with and (suspected) exposure to covid-19: Secondary | ICD-10-CM

## 2020-11-29 NOTE — Telephone Encounter (Signed)
Copied from CRM [id number]. Topic: General - Inquiry >> Nov 29, 2020 12:24 PM Adrian Prince D wrote: Reason for NOT:RRNHAFB would like a return call from Dr. Meredeth Ide. She can be reached at [phone]. Please advise

## 2020-11-30 ENCOUNTER — Encounter: Payer: Self-pay | Admitting: Nurse Practitioner

## 2020-11-30 NOTE — Telephone Encounter (Signed)
Pt is calling back and has been exposure to two people who tested positive for covid. Pt has sent mychart message. Pt would like something for her cough. [facility] [address] in [place]. Pt is waiting on her covid results

## 2020-12-01 ENCOUNTER — Other Ambulatory Visit: Payer: Self-pay

## 2020-12-01 DIAGNOSIS — Z20822 Contact with and (suspected) exposure to covid-19: Secondary | ICD-10-CM

## 2020-12-01 LAB — NOVEL CORONAVIRUS, NAA: SARS-CoV-2, NAA: NOT DETECTED

## 2020-12-01 LAB — SARS-COV-2, NAA 2 DAY TAT

## 2020-12-02 ENCOUNTER — Other Ambulatory Visit: Payer: Self-pay | Admitting: Nurse Practitioner

## 2020-12-02 MED ORDER — PSEUDOEPH-BROMPHEN-DM 30-2-10 MG/5ML PO SYRP
5.0000 mL | ORAL_SOLUTION | Freq: Four times a day (QID) | ORAL | 0 refills | Status: DC | PRN
Start: 2020-12-02 — End: 2021-02-09

## 2020-12-02 MED ORDER — BENZONATATE 100 MG PO CAPS: 100.0000 mg | ORAL_CAPSULE | Freq: Three times a day (TID) | ORAL | 0 refills | Status: DC | PRN

## 2020-12-02 MED FILL — BROMPHENIR-PSEUDOEPHED-DM S: 30-2-10 | 12 days supply | Qty: 240 | Fill #0

## 2020-12-02 MED FILL — BENZONATATE 100 MG CAPS: 100 | 7 days supply | Qty: 21 | Fill #0

## 2020-12-03 LAB — SARS-COV-2, NAA 2 DAY TAT

## 2020-12-03 LAB — NOVEL CORONAVIRUS, NAA: SARS-CoV-2, NAA: DETECTED — AB

## 2020-12-04 NOTE — Telephone Encounter (Signed)
Medications sent

## 2020-12-07 ENCOUNTER — Other Ambulatory Visit: Payer: Self-pay | Admitting: Nurse Practitioner

## 2020-12-07 MED ORDER — ALBUTEROL SULFATE HFA 108 (90 BASE) MCG/ACT IN AERS: 2.0000 | INHALATION_SPRAY | Freq: Four times a day (QID) | RESPIRATORY_TRACT | 1 refills | Status: DC | PRN

## 2020-12-07 MED FILL — ALBUTEROL SULFATE HFA 108 (: 108 (90 BAS | 25 days supply | Qty: 18 | Fill #0

## 2020-12-08 ENCOUNTER — Other Ambulatory Visit: Payer: Self-pay

## 2020-12-08 ENCOUNTER — Ambulatory Visit (INDEPENDENT_AMBULATORY_CARE_PROVIDER_SITE_OTHER): Payer: No Typology Code available for payment source | Admitting: Nurse Practitioner

## 2020-12-08 ENCOUNTER — Encounter: Payer: Self-pay | Admitting: Nurse Practitioner

## 2020-12-08 VITALS — BP 124/80 | HR 61 | Temp 98.0°F | Ht 64.0 in | Wt 195.0 lb

## 2020-12-08 DIAGNOSIS — R053 Chronic cough: Secondary | ICD-10-CM

## 2020-12-08 DIAGNOSIS — Z8616 Personal history of COVID-19: Secondary | ICD-10-CM | POA: Diagnosis not present

## 2020-12-08 MED ORDER — TRIAMCINOLONE ACETONIDE 40 MG/ML IJ SUSP
60.0000 mg | Freq: Once | INTRAMUSCULAR | Status: AC
  Administered 2020-12-08: 60 mg via INTRAMUSCULAR

## 2020-12-08 NOTE — Progress Notes (Signed)
I,Yamilka Roman [employer] as a Neurosurgeon for [employer], FNP.,have documented all relevant documentation on the behalf of Arnette Felts, FNP,as directed by  Arnette Felts, FNP while in the presence of Arnette Felts, FNP. This visit occurred during the SARS-CoV-2 public health emergency.  Safety protocols were in place, including screening questions prior to the visit, additional usage of staff PPE, and extensive cleaning of exam room while observing appropriate contact time as indicated for disinfecting solutions.  Subjective:     Patient ID: Ashley Summers , female    DOB: 1981/11/21 , 40 y.o.   MRN: [id number]   Chief Complaint  Patient presents with  . Cough    Patient stated she still feels like she is coughing a lot worsened in the afternoon she also reports some chest tightness.     HPI  Patient presents today for a cough that is worsened in the afternoons and some chest tightness. She took cough syrup and albuterol nebulizer.  She was positive for covid last week. No previous history of bronchitis.   Cough This is a new problem. The current episode started in the past 7 days. The problem occurs constantly. Associated symptoms include chest pain (tightness ). Pertinent negatives include no nasal congestion or shortness of breath. Exacerbated by: activity. She has tried prescription cough suppressant and ipratropium inhaler for the symptoms. There is no history of asthma.     Past Medical History:  Diagnosis Date  . Allergy   . Anxiety   . Essential hypertension 09/09/2020  . Hypertension   . Palpitations 09/09/2020     Family History  Problem Relation Age of Onset  . Hypertension Mother   . Stroke Mother   . Pulmonary embolism Mother   . Hypertension Brother   . Hyperlipidemia Maternal Grandmother   . Hypertension Maternal Grandmother      Current Outpatient Medications:  .  albuterol (VENTOLIN HFA) 108 (90 Base) MCG/ACT inhaler, Inhale 2 puffs into the lungs  every 6 (six) hours as needed for wheezing or shortness of breath., Disp: 18 g, Rfl: 1 .  benzonatate (TESSALON PERLES) 100 MG capsule, Take 1 capsule (100 mg total) by mouth 3 (three) times daily as needed for cough., Disp: 21 capsule, Rfl: 0 .  BIOTIN PO, Take by mouth daily., Disp: , Rfl:  .  brompheniramine-pseudoephedrine-DM 30-2-10 MG/5ML syrup, Take 5 mLs by mouth 4 (four) times daily as needed., Disp: 240 mL, Rfl: 0 .  cetirizine (ZYRTEC) 10 MG tablet, Take by mouth., Disp: , Rfl:  .  cholecalciferol (VITAMIN D3) 25 MCG (1000 UNIT) tablet, Take 2,000 Units by mouth daily., Disp: , Rfl:  .  hydrochlorothiazide (HYDRODIURIL) 25 MG tablet, Take 1 tablet (25 mg total) by mouth daily., Disp: 90 tablet, Rfl: 0 .  hydrOXYzine (ATARAX/VISTARIL) 10 MG tablet, Take 1 tablet (10 mg total) by mouth every 6 (six) hours as needed for anxiety., Disp: 60 tablet, Rfl: 3 .  Insulin Pen Needle (PEN NEEDLES) 31G X 8 MM MISC, UAD, Disp: 100 each, Rfl: 6 .  levonorgestrel (MIRENA) 20 MCG/24HR IUD, 1 each by Intrauterine route once., Disp: , Rfl:  .  Liraglutide -Weight Management (SAXENDA) 18 MG/3ML SOPN, Inject into the skin once a week., Disp: , Rfl:  .  Multiple Vitamin (MULTIVITAMIN) tablet, Take 1 tablet by mouth daily., Disp: , Rfl:  .  nystatin cream (MYCOSTATIN), Apply 1 application topically 2 (two) times daily. Under breast, Disp: 30 g, Rfl: 3  Current Facility-Administered Medications:  .  triamcinolone acetonide (KENALOG-40) injection 60 mg, 60 mg, Intramuscular, Once, Arnette Felts, FNP   Allergies  Allergen Reactions  . Penicillins Rash     Review of Systems  Constitutional: Negative.   Respiratory: Positive for cough. Negative for shortness of breath.   Cardiovascular: Positive for chest pain (tightness ).  Psychiatric/Behavioral: Negative.      Today's Vitals   12/08/20 1602  BP: 124/80  Pulse: 61  Temp: 98 F (36.7 C)  TempSrc: Oral  SpO2: 97%  Weight: 195 lb (88.5 kg)   Height: 5\' 4"  (1.626 m)  PainSc: 5   PainLoc: Chest   Body mass index is 33.47 kg/m.   Objective:  Physical Exam Vitals reviewed.  Constitutional:      General: She is not in acute distress.    Appearance: Normal appearance.  Cardiovascular:     Rate and Rhythm: Normal rate and regular rhythm.     Pulses: Normal pulses.     Heart sounds: Normal heart sounds. No murmur heard.   Pulmonary:     Effort: Pulmonary effort is normal. No respiratory distress.     Breath sounds: Normal breath sounds. No stridor. No wheezing, rhonchi or rales.  Chest:     Chest wall: No tenderness.  Neurological:     General: No focal deficit present.     Mental Status: She is alert and oriented to person, place, and time.     Cranial Nerves: No cranial nerve deficit.     Motor: No weakness.  Psychiatric:        Mood and Affect: Mood normal.        Behavior: Behavior normal.        Thought Content: Thought content normal.        Judgment: Judgment normal.         Assessment And Plan:     1. Persistent cough  Cough is worse at night and after walking short distances  This could be developing into a bronchitis will treat with a steroid.  - triamcinolone acetonide (KENALOG-40) injection 60 mg  2. History of COVID-19  Explained cough is typically the last to go when sick with covid  Continue using albuterol inhaler 2 times a day for at least the next 3 days - triamcinolone acetonide (KENALOG-40) injection 60 mg     Patient was given opportunity to ask questions. Patient verbalized understanding of the plan and was able to repeat key elements of the plan. All questions were answered to their satisfaction.  , FNP    I, Arnette Felts, FNP, have reviewed all documentation for this visit. The documentation on 12/08/20 for the exam, diagnosis, procedures, and orders are all accurate and complete.   THE PATIENT IS ENCOURAGED TO PRACTICE SOCIAL DISTANCING DUE TO THE COVID-19  PANDEMIC.

## 2020-12-16 ENCOUNTER — Encounter (HOSPITAL_BASED_OUTPATIENT_CLINIC_OR_DEPARTMENT_OTHER): Payer: No Typology Code available for payment source | Admitting: Internal Medicine

## 2020-12-21 ENCOUNTER — Other Ambulatory Visit: Payer: Self-pay | Admitting: Nurse Practitioner

## 2020-12-21 ENCOUNTER — Other Ambulatory Visit: Payer: Self-pay

## 2020-12-21 DIAGNOSIS — I10 Essential (primary) hypertension: Secondary | ICD-10-CM

## 2020-12-21 MED ORDER — HYDROCHLOROTHIAZIDE 25 MG PO TABS: 25.0000 mg | ORAL_TABLET | Freq: Every day | ORAL | 0 refills | Status: DC

## 2020-12-21 MED FILL — HYDROCHLOROTHIAZIDE 25 MG T: 25 | 90 days supply | Qty: 90 | Fill #0

## 2021-01-03 ENCOUNTER — Encounter: Payer: Self-pay | Admitting: Plastic Surgery

## 2021-01-03 ENCOUNTER — Other Ambulatory Visit: Payer: Self-pay

## 2021-01-03 ENCOUNTER — Ambulatory Visit (INDEPENDENT_AMBULATORY_CARE_PROVIDER_SITE_OTHER): Payer: No Typology Code available for payment source | Admitting: Plastic Surgery

## 2021-01-03 VITALS — BP 133/88 | HR 73 | Ht 64.0 in | Wt 196.0 lb

## 2021-01-03 DIAGNOSIS — M793 Panniculitis, unspecified: Secondary | ICD-10-CM

## 2021-01-03 NOTE — Progress Notes (Signed)
   Subjective:    Patient ID: Ashley Summers, female    DOB: [birth year], 40 y.o.   MRN: [id number]  Is a 40 year old female here for further evaluation of her abdomen.  She complains of ongoing panniculitis.  She also complains of back pain.  She has done a course of physical therapy which helped a little but not resolved the back pain.  She is 5 feet 4 inches tall and weighs 196 pounds with 5 pound weight loss since her last visit in October.  She has been working on weight reduction.  She does not smoke and she does not have diabetes.  Her last hemoglobin A1c from October was 5.3.  She has 3 kids.  She would like to lose another 5 pounds before any surgery.  I think that that is a very good idea.     Review of Systems  Constitutional: Positive for activity change. Negative for appetite change.  HENT: Negative.   Eyes: Negative.   Respiratory: Negative.   Cardiovascular: Negative.   Gastrointestinal: Negative.   Endocrine: Negative.   Genitourinary: Negative.   Neurological: Negative.   Hematological: Negative.        Objective:   Physical Exam Vitals and nursing note reviewed.  Constitutional:      Appearance: Normal appearance.  HENT:     Head: Normocephalic and atraumatic.  Cardiovascular:     Rate and Rhythm: Normal rate.     Pulses: Normal pulses.  Pulmonary:     Effort: Pulmonary effort is normal.  Abdominal:     General: Abdomen is flat. There is no distension.     Palpations: There is no mass.     Tenderness: There is no abdominal tenderness.  Neurological:     General: No focal deficit present.     Mental Status: She is alert and oriented to person, place, and time.  Psychiatric:        Mood and Affect: Mood normal.        Behavior: Behavior normal.        Thought Content: Thought content normal.        Assessment & Plan:     ICD-10-CM   1. Panniculitis  M79.3     Recommend follow-up in a month for reevaluation.  Pictures were obtained of the  patient and placed in the chart with the patient's or guardian's permission.

## 2021-02-03 ENCOUNTER — Ambulatory Visit: Payer: No Typology Code available for payment source | Admitting: Plastic Surgery

## 2021-02-09 ENCOUNTER — Ambulatory Visit (INDEPENDENT_AMBULATORY_CARE_PROVIDER_SITE_OTHER): Payer: No Typology Code available for payment source | Admitting: Nurse Practitioner

## 2021-02-09 ENCOUNTER — Other Ambulatory Visit: Payer: Self-pay | Admitting: Nurse Practitioner

## 2021-02-09 ENCOUNTER — Other Ambulatory Visit: Payer: Self-pay

## 2021-02-09 ENCOUNTER — Encounter: Payer: Self-pay | Admitting: Nurse Practitioner

## 2021-02-09 VITALS — BP 142/90 | HR 62 | Temp 98.0°F | Ht 64.0 in | Wt 196.0 lb

## 2021-02-09 DIAGNOSIS — G4709 Other insomnia: Secondary | ICD-10-CM | POA: Diagnosis not present

## 2021-02-09 DIAGNOSIS — Z6833 Body mass index (BMI) 33.0-33.9, adult: Secondary | ICD-10-CM

## 2021-02-09 DIAGNOSIS — E559 Vitamin D deficiency, unspecified: Secondary | ICD-10-CM

## 2021-02-09 DIAGNOSIS — I10 Essential (primary) hypertension: Secondary | ICD-10-CM | POA: Diagnosis not present

## 2021-02-09 DIAGNOSIS — R0683 Snoring: Secondary | ICD-10-CM

## 2021-02-09 MED ORDER — AMLODIPINE BESYLATE 2.5 MG PO TABS
2.5000 mg | ORAL_TABLET | Freq: Every day | ORAL | 2 refills | Status: DC
Start: 2021-02-09 — End: 2021-02-09

## 2021-02-09 MED FILL — AMLODIPINE 2.5 MG TABLET: 2.5 | 30 days supply | Qty: 30 | Fill #0

## 2021-02-09 NOTE — Patient Instructions (Signed)

## 2021-02-09 NOTE — Progress Notes (Signed)
I,Yamilka Roman [employer] as a Education administrator for [employer], FNP.,have documented all relevant documentation on the behalf of Minette Brine, FNP,as directed by  Minette Brine, FNP while in the presence of Minette Brine, [place]. This visit occurred during the SARS-CoV-2 public health emergency.  Safety protocols were in place, including screening questions prior to the visit, additional usage of staff PPE, and extensive cleaning of exam room while observing appropriate contact time as indicated for disinfecting solutions.  Subjective:     Patient ID: Ashley Summers , female    DOB: [birth year] , 40 y.o.   MRN: [id number]   Chief Complaint  Patient presents with  . Hypertension    Patient stated she noticed her blood pressure has been running a little high lately. She reports having headaches.    HPI  Patient presents today for a blood pressure check.  Has had HTN for about 2 years. Takes daily, took this morning about 7 am.  She did eat chinese food yesterday, drank a smoothie last night.  She is not exercising. Her blood pressure had been in the 130/70s but in the last few days has been in the 140/98.      Wt Readings from Last 3 Encounters: 02/09/21 : 196 lb (88.9 kg) 01/03/21 : 196 lb (88.9 kg) 12/08/20 : 195 lb (88.5 kg)  She had been seeing Geryl Rankins.    She is taking magnesium and if she takes melatonin it keeps her sleep for a few hours but when she is up will not be able to go back to sleep.  She will also take hydroxyzine for anxiety.  She has noticed having more headaches, lasting for few hours.  She is taking tylenol for her headaches. Will sometimes wake up with a headache.  Her son has been deployed to [place] this week.   She has also taken phentermine and she feels was the cause for her palpitations.  She is not taking Saxenda at this time. She may have some refills.   Hypertension This is a chronic problem. The current episode started more than 1 year ago. The problem is  controlled. Associated symptoms include headaches. Pertinent negatives include no anxiety or peripheral edema. Risk factors for coronary artery disease include sedentary lifestyle. Past treatments include diuretics. There are no compliance problems.  There is no history of angina. There is no history of chronic renal disease.  Insomnia Primary symptoms: sleep disturbance.      Past Medical History:  Diagnosis Date  . Allergy   . Anxiety   . Essential hypertension 09/09/2020  . Hypertension   . Palpitations 09/09/2020     Family History  Problem Relation Age of Onset  . Hypertension Mother   . Stroke Mother   . Pulmonary embolism Mother   . Hypertension Brother   . Hyperlipidemia Maternal Grandmother   . Hypertension Maternal Grandmother      Current Outpatient Medications:  .  albuterol (VENTOLIN HFA) 108 (90 Base) MCG/ACT inhaler, Inhale 2 puffs into the lungs every 6 (six) hours as needed for wheezing or shortness of breath., Disp: 18 g, Rfl: 1 .  amLODipine (NORVASC) 2.5 MG tablet, Take 1 tablet (2.5 mg total) by mouth daily., Disp: 30 tablet, Rfl: 2 .  BIOTIN PO, Take by mouth daily., Disp: , Rfl:  .  cetirizine (ZYRTEC) 10 MG tablet, Take by mouth., Disp: , Rfl:  .  cholecalciferol (VITAMIN D3) 25 MCG (1000 UNIT) tablet, Take 2,000 Units by mouth daily., Disp: ,  Rfl:  .  hydrochlorothiazide (HYDRODIURIL) 25 MG tablet, Take 1 tablet (25 mg total) by mouth daily., Disp: 90 tablet, Rfl: 0 .  hydrOXYzine (ATARAX/VISTARIL) 10 MG tablet, Take 1 tablet (10 mg total) by mouth every 6 (six) hours as needed for anxiety., Disp: 60 tablet, Rfl: 3 .  Insulin Pen Needle (PEN NEEDLES) 31G X 8 MM MISC, UAD, Disp: 100 each, Rfl: 6 .  levonorgestrel (MIRENA) 20 MCG/24HR IUD, 1 each by Intrauterine route once., Disp: , Rfl:  .  Liraglutide -Weight Management (SAXENDA) 18 MG/3ML SOPN, Inject into the skin once a week., Disp: , Rfl:  .  Multiple Vitamin (MULTIVITAMIN) tablet, Take 1 tablet by  mouth daily., Disp: , Rfl:  .  nystatin cream (MYCOSTATIN), Apply 1 application topically 2 (two) times daily. Under breast, Disp: 30 g, Rfl: 3   Allergies  Allergen Reactions  . Penicillins Rash     Review of Systems  Constitutional: Negative.   HENT: Negative.   Eyes: Negative.   Respiratory: Negative.   Cardiovascular: Negative.   Gastrointestinal: Negative.   Endocrine: Negative.   Genitourinary: Negative.   Musculoskeletal: Negative.   Skin: Negative.   Neurological: Positive for headaches.  Hematological: Negative.   Psychiatric/Behavioral: Positive for sleep disturbance. The patient has insomnia.      Today's Vitals   02/09/21 0853  BP: (!) 142/90  Pulse: 62  Temp: 98 F (36.7 C)  TempSrc: Oral  Weight: 196 lb (88.9 kg)  Height: 5\' 4"  (1.626 m)  PainSc: 0-No pain   Body mass index is 33.64 kg/m.   Objective:  Physical Exam Constitutional:      General: She is not in acute distress.    Appearance: Normal appearance. She is obese.  Cardiovascular:     Rate and Rhythm: Normal rate and regular rhythm.     Pulses: Normal pulses.     Heart sounds: Normal heart sounds.  Pulmonary:     Effort: Pulmonary effort is normal.     Breath sounds: Normal breath sounds.  Abdominal:     General: Abdomen is flat. Bowel sounds are normal.     Palpations: Abdomen is soft.  Musculoskeletal:        General: Normal range of motion.     Cervical back: Normal range of motion and neck supple.  Skin:    General: Skin is warm and dry.     Capillary Refill: Capillary refill takes less than 2 seconds.  Neurological:     General: No focal deficit present.     Mental Status: She is alert and oriented to person, place, and time.  Psychiatric:        Mood and Affect: Mood normal.        Behavior: Behavior normal.        Thought Content: Thought content normal.        Judgment: Judgment normal.         Assessment And Plan:     1. Essential hypertension  Chronic, blood  pressure has been more elevated over the last few days   Stress related to her son being deployed could be contributing as well as poor sleep  Will check sleep study as well. She is concerned she may not be able to sleep in a facility  - Ambulatory referral to Sleep Studies - amLODipine (NORVASC) 2.5 MG tablet; Take 1 tablet (2.5 mg total) by mouth daily.  Dispense: 30 tablet; Refill: 2  2. Vitamin D deficiency disease  Take vitamin  d supplement as needed.     Also encouraged to spend 15 minutes in the sun daily.   3. Snoring  Fatigued often as well  Has a sleep study ordered previously but there were issues with insurance coverage - Ambulatory referral to Sleep Studies  4. BMI 33.0-33.9,adult She is encouraged to strive for BMI less than 30 to decrease cardiac risk. Advised to aim for at least 150 minutes of exercise per week. - Ambulatory referral to Sleep Studies     Patient was given opportunity to ask questions. Patient verbalized understanding of the plan and was able to repeat key elements of the plan. All questions were answered to their satisfaction.  Minette Brine, FNP    I, Minette Brine, FNP, have reviewed all documentation for this visit. The documentation on 02/09/21 for the exam, diagnosis, procedures, and orders are all accurate and complete.   THE PATIENT IS ENCOURAGED TO PRACTICE SOCIAL DISTANCING DUE TO THE COVID-19 PANDEMIC.

## 2021-02-28 ENCOUNTER — Ambulatory Visit: Payer: No Typology Code available for payment source | Admitting: Surgical

## 2021-03-01 ENCOUNTER — Other Ambulatory Visit: Payer: Self-pay | Admitting: Nurse Practitioner

## 2021-03-01 ENCOUNTER — Other Ambulatory Visit: Payer: Self-pay

## 2021-03-01 MED ORDER — NYSTATIN 100000 UNIT/GM EX CREA: 1.0000 "application " | TOPICAL_CREAM | Freq: Two times a day (BID) | CUTANEOUS | 3 refills | Status: DC

## 2021-03-01 NOTE — Telephone Encounter (Signed)
The pt wanted a refill on her nystatin cream.

## 2021-03-03 MED FILL — NYSTATIN 100,000 UNIT/GM CR: 100000 | 15 days supply | Qty: 30 | Fill #0

## 2021-03-10 ENCOUNTER — Other Ambulatory Visit (HOSPITAL_COMMUNITY): Payer: Self-pay

## 2021-03-10 MED FILL — Amlodipine Besylate Tab 2.5 MG (Base Equivalent): ORAL | 30 days supply | Qty: 30 | Fill #0 | Status: AC

## 2021-03-15 ENCOUNTER — Ambulatory Visit: Payer: No Typology Code available for payment source | Admitting: Neurology

## 2021-03-15 ENCOUNTER — Telehealth: Payer: Self-pay

## 2021-03-15 ENCOUNTER — Encounter: Payer: Self-pay | Admitting: Neurology

## 2021-03-15 ENCOUNTER — Other Ambulatory Visit: Payer: Self-pay

## 2021-03-15 VITALS — BP 118/84 | HR 78 | Ht 64.0 in | Wt 200.0 lb

## 2021-03-15 DIAGNOSIS — R0681 Apnea, not elsewhere classified: Secondary | ICD-10-CM

## 2021-03-15 DIAGNOSIS — E669 Obesity, unspecified: Secondary | ICD-10-CM | POA: Diagnosis not present

## 2021-03-15 DIAGNOSIS — R0683 Snoring: Secondary | ICD-10-CM

## 2021-03-15 DIAGNOSIS — G4719 Other hypersomnia: Secondary | ICD-10-CM

## 2021-03-15 DIAGNOSIS — R519 Headache, unspecified: Secondary | ICD-10-CM

## 2021-03-15 NOTE — Progress Notes (Signed)
Subjective:    Patient ID: Ashley Summers is a 40 y.o. female.  HPI     Star Age, MD, PhD [facility] [address] [place], [place] [address]  Dear Doreene Burke,   I saw your patient, Ashley Summers, upon your kind request in my sleep clinic today for initial consultation of Ashley Summers sleep disorder, in particular, concern for underlying obstructive sleep apnea.  The patient is unaccompanied today.  As you know, Ashley Summers is a 40 year old right-handed woman with an underlying medical history of hypertension, allergies, anxiety, palpitations and obesity, who reports snoring and excessive daytime somnolence, as well as witnessed apneas. She has had occasional morning headaches.  She does not typically suffer from any migraines and headaches in the mornings are typically dull or achy and bifrontal.  She does not have a family history of sleep apnea.  She does not wake up rested.  Some years ago she was encouraged to pursue sleep testing but it did not come to fruition.  She lives with Ashley Summers 42 year old daughter.  She has 2 older children, ages 79 and 53, 1 grandchild.  She is a non-smoker, drinks alcohol occasionally, caffeine in the form of coffee in the morning, 1 or 2 cups/day on average.  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  She has gained weight over time but is more or less stable in the past few years, she has recently started exercising.  Bedtime is generally between 830 and 930.  She may not fall asleep until 10:30 or 11 PM.  Ashley Summers rise time has been 630 but lately she has been getting up at 530 to go to the gym.  She does watch TV in the bedroom.  She turns the TV on before falling asleep or it will turn off on a sleep timer.  They do not have any pets in the house.  She has no night to night nocturia.  I reviewed your office note from 02/09/2021.  Ashley Summers Epworth sleepiness score is 7 out of 24, fatigue severity score is 30 out of  63.  She has woken up with a sense of gasping for air.  Ashley Summers Past Medical History Is Significant For: Past Medical History:  Diagnosis Date  . Allergy   . Anxiety   . Essential hypertension 09/09/2020  . Hypertension   . Palpitations 09/09/2020    Ashley Summers Past Surgical History Is Significant For: Past Surgical History:  Procedure Laterality Date  . BREAST SURGERY     Reduction   . Hammertoe correction      Ashley Summers Family History Is Significant For: Family History  Problem Relation Age of Onset  . Hypertension Mother   . Stroke Mother   . Pulmonary embolism Mother   . Hypertension Brother   . Hyperlipidemia Maternal Grandmother   . Hypertension Maternal Grandmother     Ashley Summers Social History Is Significant For: Social History   Socioeconomic History  . Marital status: Single    Spouse name: Not on file  . Number of children: Not on file  . Years of education: Not on file  . Highest education level: Not on file  Occupational History  . Not on file  Tobacco Use  . Smoking status: Never Smoker  . Smokeless tobacco: Never Used  Vaping Use  . Vaping Use: Never used  Substance and Sexual Activity  . Alcohol use: Yes    Alcohol/week: 0.0 standard drinks    Comment: occasional  .  Drug use: No  . Sexual activity: Yes    Partners: Male    Birth control/protection: I.U.D.  Other Topics Concern  . Not on file  Social History Narrative   Work or School: Chartered certified accountant, [employer], [employer]      Home Situation: lives with 3 kids (17,15, 9)      Spiritual Beliefs: none      Lifestyle: diet is so so; has been trying to exercising            Social Determinants of Health   Financial Resource Strain: Not on file  Food Insecurity: Not on file  Transportation Needs: Not on file  Physical Activity: Not on file  Stress: Not on file  Social Connections: Not on file    Ashley Summers Allergies Are:  Allergies  Allergen Reactions  . Penicillins Rash  :   Ashley Summers Current Medications Are:   Outpatient Encounter Medications as of 03/15/2021  Medication Sig  . albuterol (VENTOLIN HFA) 108 (90 Base) MCG/ACT inhaler INHALE 2 PUFFS INTO THE LUNGS EVERY 6 (SIX) HOURS AS NEEDED FOR WHEEZING OR SHORTNESS OF BREATH.  Marland Kitchen amLODipine (NORVASC) 2.5 MG tablet TAKE 1 TABLET (2.5 MG TOTAL) BY MOUTH DAILY.  . cetirizine (ZYRTEC) 10 MG tablet Take by mouth.  . cholecalciferol (VITAMIN D3) 25 MCG (1000 UNIT) tablet Take 2,000 Units by mouth daily.  . hydrochlorothiazide (HYDRODIURIL) 25 MG tablet TAKE 1 TABLET (25 MG TOTAL) BY MOUTH DAILY.  . hydrOXYzine (ATARAX/VISTARIL) 10 MG tablet Take 1 tablet (10 mg total) by mouth every 6 (six) hours as needed for anxiety.  . Insulin Pen Needle (PEN NEEDLES) 31G X 8 MM MISC UAD  . levonorgestrel (MIRENA) 20 MCG/24HR IUD 1 each by Intrauterine route once.  . Liraglutide -Weight Management (SAXENDA) 18 MG/3ML SOPN Inject into the skin once a week.  . Liraglutide -Weight Management 18 MG/3ML SOPN INJECT 0.4 MLS (2.4 MG TOTAL) INTO THE SKIN DAILY.  . Multiple Vitamin (MULTIVITAMIN) tablet Take 1 tablet by mouth daily.  Marland Kitchen nystatin cream (MYCOSTATIN) APPLY 1 APPLICATION TOPICALLY 2 (TWO) TIMES DAILY. UNDER BREAST  . [DISCONTINUED] BIOTIN PO Take by mouth daily.   No facility-administered encounter medications on file as of 03/15/2021.  :   Review of Systems:  Out of a complete 14 point review of systems, all are reviewed and negative with the exception of these symptoms as listed below:  Review of Systems  Neurological:       Here for sleep consult. No prior sleep study. Reports one was recommended a few years back but did not pursue.  Epworth Sleepiness Scale 0= would never doze 1= slight chance of dozing 2= moderate chance of dozing 3= high chance of dozing  Sitting and reading:3 Watching TV:1 Sitting inactive in a public place (ex. Theater or meeting):1 As a passenger in a car for an hour without a break:0 Lying down to rest in the  afternoon:2 Sitting and talking to someone:0 Sitting quietly after lunch (no alcohol):0 In a car, while stopped in traffic:0 Total:7     Objective:  Neurological Exam  Physical Exam Physical Examination:   Vitals:   03/15/21 1443  BP: 118/84  Pulse: 78  SpO2: 98%    General Examination: The patient is a very pleasant 40 y.o. female in no acute distress. She appears well-developed and well-nourished and well groomed.   HEENT: Normocephalic, atraumatic, pupils are equal, round and reactive to light, extraocular tracking is good without limitation to gaze excursion or nystagmus  noted. Hearing is grossly intact. Face is symmetric with normal facial animation. Speech is clear with no dysarthria noted. There is no hypophonia. There is no lip, neck/head, jaw or voice tremor. Neck is supple with full range of passive and active motion. There are no carotid bruits on auscultation. Oropharynx exam reveals: mild mouth dryness, good dental hygiene and moderate airway crowding, due to tonsillar size of about 2+ bilaterally.  Longer uvula noted.  Mallampati class II.  Neck circumference of 15 1/8 inches.  She has a moderate to significant overbite and diastema.  Tongue protrudes centrally and palate elevates symmetrically.  Chest: Clear to auscultation without wheezing, rhonchi or crackles noted.  Heart: S1+S2+0, regular and normal without murmurs, rubs or gallops noted.   Abdomen: Soft, non-tender and non-distended with normal bowel sounds appreciated on auscultation.  Extremities: There is no pitting edema in the distal lower extremities bilaterally.   Skin: Warm and dry without trophic changes noted.   Musculoskeletal: exam reveals no obvious joint deformities, tenderness or joint swelling or erythema.   Neurologically:  Mental status: The patient is awake, alert and oriented in all 4 spheres. Ashley Summers immediate and remote memory, attention, language skills and fund of knowledge are  appropriate. There is no evidence of aphasia, agnosia, apraxia or anomia. Speech is clear with normal prosody and enunciation. Thought process is linear. Mood is normal and affect is normal.  Cranial nerves II - XII are as described above under HEENT exam.  Motor exam: Normal bulk, strength and tone is noted. There is no tremor, Romberg is negative. Fine motor skills and coordination: grossly intact.  Cerebellar testing: No dysmetria or intention tremor. There is no truncal or gait ataxia.  Sensory exam: intact to light touch in the upper and lower extremities.  Gait, station and balance: She stands easily. No veering to one side is noted. No leaning to one side is noted. Posture is age-appropriate and stance is narrow based. Gait shows normal stride length and normal pace. No problems turning are noted. Tandem walk is unremarkable.                Assessment and Plan:   In summary, Ashley Summers is a very pleasant 40 y.o.-year old female with an underlying medical history of hypertension, allergies, anxiety, palpitations and obesity, whose history and physical exam are concerning for obstructive sleep apnea (OSA). I had a long chat with the patient about my findings and the diagnosis of OSA, its prognosis and treatment options. We talked about medical treatments, surgical interventions and non-pharmacological approaches. I explained in particular the risks and ramifications of untreated moderate to severe OSA, especially with respect to developing cardiovascular disease down the Road, including congestive heart failure, difficult to treat hypertension, cardiac arrhythmias, or stroke. Even type 2 diabetes has, in part, been linked to untreated OSA. Symptoms of untreated OSA include daytime sleepiness, memory problems, mood irritability and mood disorder such as depression and anxiety, lack of energy, as well as recurrent headaches, especially morning headaches. We talked about trying to maintain a  healthy lifestyle in general, as well as the importance of weight control. We also talked about the importance of good sleep hygiene. I recommended the following at this time: sleep study.   I explained the sleep test procedure to the patient and also outlined possible surgical and non-surgical treatment options of OSA, including the use of a custom-made dental device (which would require a referral to a specialist dentist or oral surgeon), upper  airway surgical options, such as traditional UPPP or a novel less invasive surgical option in the form of Inspire hypoglossal nerve stimulation (which would involve a referral to an ENT surgeon). I also explained the CPAP treatment option to the patient, who indicated that she would be willing to try CPAP if the need arises. I explained the importance of being compliant with PAP treatment, not only for insurance purposes but primarily to improve Ashley Summers symptoms, and for the patient's long term health benefit, including to reduce Ashley Summers cardiovascular risks. I answered all Ashley Summers questions today and the patient was in agreement. I plan to see Ashley Summers back after the sleep study is completed and encouraged Ashley Summers to call with any interim questions, concerns, problems or updates.   Thank you very much for allowing me to participate in the care of this nice patient. If I can be of any further assistance to you please do not hesitate to call me at [phone].  Sincerely,   Star Age, MD, PhD

## 2021-03-15 NOTE — Patient Instructions (Signed)

## 2021-03-15 NOTE — Telephone Encounter (Signed)
Verifying medication for patient YL,RMA

## 2021-03-23 ENCOUNTER — Other Ambulatory Visit (HOSPITAL_COMMUNITY): Payer: Self-pay

## 2021-03-23 ENCOUNTER — Other Ambulatory Visit: Payer: Self-pay | Admitting: Nurse Practitioner

## 2021-03-23 DIAGNOSIS — I10 Essential (primary) hypertension: Secondary | ICD-10-CM

## 2021-03-23 MED ORDER — HYDROCHLOROTHIAZIDE 25 MG PO TABS
25.0000 mg | ORAL_TABLET | Freq: Every day | ORAL | 1 refills | Status: DC
  Filled 2021-03-23: qty 90, 90d supply, fill #0
  Filled 2021-06-28: qty 90, 90d supply, fill #1

## 2021-04-08 MED FILL — Amlodipine Besylate Tab 2.5 MG (Base Equivalent): ORAL | 30 days supply | Qty: 30 | Fill #1 | Status: AC

## 2021-04-10 ENCOUNTER — Other Ambulatory Visit (HOSPITAL_COMMUNITY): Payer: Self-pay

## 2021-04-24 ENCOUNTER — Ambulatory Visit (INDEPENDENT_AMBULATORY_CARE_PROVIDER_SITE_OTHER): Payer: No Typology Code available for payment source | Admitting: Neurology

## 2021-04-24 ENCOUNTER — Other Ambulatory Visit: Payer: Self-pay

## 2021-04-24 DIAGNOSIS — R0683 Snoring: Secondary | ICD-10-CM

## 2021-04-24 DIAGNOSIS — R519 Headache, unspecified: Secondary | ICD-10-CM

## 2021-04-24 DIAGNOSIS — G4719 Other hypersomnia: Secondary | ICD-10-CM

## 2021-04-24 DIAGNOSIS — R0681 Apnea, not elsewhere classified: Secondary | ICD-10-CM

## 2021-04-24 DIAGNOSIS — E669 Obesity, unspecified: Secondary | ICD-10-CM

## 2021-04-24 DIAGNOSIS — G4733 Obstructive sleep apnea (adult) (pediatric): Secondary | ICD-10-CM

## 2021-04-25 NOTE — Progress Notes (Signed)
See procedure note.

## 2021-04-28 NOTE — Procedures (Signed)
[facility] TEST (Watch PAT)  STUDY DATE: 04-24-21  DOB: 26-Jan-1981  MRN: [id number]  ORDERING CLINICIAN: Star Age, MD, PhD   REFERRING CLINICIAN: Minette Brine, FNP   CLINICAL INFORMATION/HISTORY: 40 year old woman with a history of hypertension, allergies, anxiety, palpitations and obesity, who reports snoring and excessive daytime somnolence, as well as witnessed apneas. She has had occasional morning headaches.    Epworth sleepiness score: 7/24.  BMI: 34.3 kg/m  Neck Circumference: 15-1/8  FINDINGS:   Total Record Time (hours, min): 6 H 25 min  Total Sleep Time (hours, min):  5 H 43 min   Percent REM (%):       Calculated pAHI (per hour):  7.7       REM pAHI: 17.1    NREM pAHI: 6.6 Supine AHI: No Supine Sleep    Oxygen Saturation (%) Mean: 95  Minimum oxygen saturation (%):        90   O2 Saturation Range (%): 90-99  O2Saturation (minutes) <=88%: 0 min   Pulse Mean (bpm):    71  Pulse Range (57-105)   IMPRESSION: OSA (obstructive sleep apnea), mild  RECOMMENDATION:  This HST shows mild obstructive sleep apnea - by number of events - with an AHI of 7.7/hour and O2 nadir of 90%. Intermittent, mild to moderate snoring was noted. Treatment with positive airway pressure can be considered with autoPAP, if desired by patient. Treatment options otherwise include weight loss and avoidance of the supine sleep position or a dental devise. These different avenues will be discussed with the patient. The patient will be seen in follow up in sleep clinic, if necessary. Please note, that other causes of the patient's symptoms, including circadian rhythm disturbances, an underlying mood disorder, medication effect and/or an underlying medical problem cannot be ruled out based on this test. Clinical correlation is recommended. The patient should be cautioned not to drive, work at heights, or operate dangerous or heavy equipment when tired or sleepy. Review and  reiteration of good sleep hygiene measures should be pursued with any patient. The referring provider will be notified of the test results.   I certify that I have reviewed the raw data recording prior to the issuance of this report in accordance with the standards of the American Academy of Sleep Medicine (AASM).  INTERPRETING PHYSICIAN:  Star Age, MD, PhD  Board Certified in Neurology and Sleep Medicine  [facility] [address], [place], [place] [address] [phone]   Sleep Summary  Oxygen Saturation Statistics   Start Study Time: End Study Time: Total Recording Time:         10:11:46 PM 4:36:57 AM 6 h, 25 min  Total Sleep Time % REM of Sleep Time:  5 h, 43 min  24.4    Mean: 95 Minimum: 90 Maximum: 99  Mean of Desaturations Nadirs (%):   93  Oxygen Desatur. %: 4-9 10-20 >20 Total  Events Number Total  9 100.0  0 0.0  0 0.0  9 100.0  Oxygen Saturation: <90 <=88 <85 <80 <70  Duration (minutes): Sleep % 0.0 0.0 0.0 0.0 0.0 0.0 0.0 0.0 0.0 0.0     Respiratory Indices      Total Events REM NREM All Night  pRDI: pAHI 3%: ODI 4%:  pAHIc 3%: % CSR: pAHI 4%:  38 34  9 0.0 0.0  12 17.1 17.1 8.6 N/A 7.6 6.6 1.3 N/A 8.6 7.7 2.1 N/A  2.7  Pulse Rate Statistics during Sleep (BPM)      Mean: 71 Minimum: 57 Maximum: 105        Body Position Statistics  Position Supine Prone Right Left Non-Supine  Sleep (min) 0.0 169.7 174.0 0.0 343.7  Sleep % 0.0 49.4 50.6 0.0 100.0  pRDI N/A 15.0 3.0 N/A 8.6  pAHI 3% N/A 13.5 2.6 N/A 7.7  ODI 4% N/A 4.3 0.0 N/A 2.1     Snoring Statistics Snoring Level (dB) >40 >50 >60 >70 >80 >Threshold (45)  Sleep (min) 245.5 3.3 0.5 0.0 0.0 9.0  Sleep % 71.4 1.0 0.1 0.0 0.0 2.6    Mean: 41 dB

## 2021-04-28 NOTE — Progress Notes (Signed)
Patient referred by Minette Brine, NP, seen by me on 03/15/21, HST on 04/24/21.    Please call and notify the patient that the recent home sleep test showed obstructive sleep apnea. OSA is overall mild, but would be worth treating to see if she feels better after treatment. To that end I recommend treatment for this in the form of autoPAP, which means, that we don't have to bring her in for a sleep study with CPAP, but will let her try an autoPAP machine at home, through a DME company (of her choice, or as per insurance requirement). The DME representative will educate her on how to use the machine, how to put the mask on, etc. I have not put an order in yet, please let me know, how she would like to proceed. Alternative treatment could be weight loss and avoiding the supine sleep position or a dental device. If she would like to pursue dental treatment, I can place a referral to dentistry, a dentist of her choosing, or we can refer to one of the practices we know participate in OSA treatment. Again, let me know.   Star Age, MD, PhD [facility])

## 2021-05-02 ENCOUNTER — Telehealth: Payer: Self-pay

## 2021-05-02 DIAGNOSIS — G4733 Obstructive sleep apnea (adult) (pediatric): Secondary | ICD-10-CM

## 2021-05-02 DIAGNOSIS — R0683 Snoring: Secondary | ICD-10-CM

## 2021-05-02 NOTE — Telephone Encounter (Signed)
-----   Message from Star Age, MD sent at 04/28/2021  1:19 PM EDT ----- Patient referred by Minette Brine, NP, seen by me on 03/15/21, HST on 04/24/21.    Please call and notify the patient that the recent home sleep test showed obstructive sleep apnea. OSA is overall mild, but would be worth treating to see if she feels better after treatment. To that end I recommend treatment for this in the form of autoPAP, which means, that we don't have to bring her in for a sleep study with CPAP, but will let her try an autoPAP machine at home, through a DME company (of her choice, or as per insurance requirement). The DME representative will educate her on how to use the machine, how to put the mask on, etc. I have not put an order in yet, please let me know, how she would like to proceed. Alternative treatment could be weight loss and avoiding the supine sleep position or a dental device. If she would like to pursue dental treatment, I can place a referral to dentistry, a dentist of her choosing, or we can refer to one of the practices we know participate in OSA treatment. Again, let me know.   Star Age, MD, PhD [facility])

## 2021-05-02 NOTE — Telephone Encounter (Signed)
I called the patient, and we discussed the results of the sleep study.  Patient would like to pursue an oral appliance at this time to treat her sleep apnea.  Patient is going to check with her local dentist about the fitting for the oral appliance but has asked that I put a referral in as well.  Referral has been placed to Dr. Toy Cookey for work-up of oral appliance.

## 2021-05-02 NOTE — Telephone Encounter (Signed)
Noted, thank you, referral signed.

## 2021-05-09 ENCOUNTER — Encounter: Payer: Self-pay | Admitting: Nurse Practitioner

## 2021-05-09 NOTE — Telephone Encounter (Signed)
I called pt's insurance, [employer].  If patient has dental coverage she should contact her regular dentist to see if they can provide an oral appliance. If not, patient will need a referral from her primary care team.  Her insurance does not cover referrals from specialists.  I called patient and discussed this with her.  She has already called her dentist, [facility], and they do not provide oral appliances.  She will contact her primary care for a referral to dentistry.  I recommended [facility] or Dr. Oneal Grout.  Patient verbalized understanding.

## 2021-05-11 ENCOUNTER — Other Ambulatory Visit: Payer: Self-pay

## 2021-05-11 ENCOUNTER — Other Ambulatory Visit (HOSPITAL_COMMUNITY): Payer: Self-pay

## 2021-05-11 DIAGNOSIS — I10 Essential (primary) hypertension: Secondary | ICD-10-CM

## 2021-05-11 MED ORDER — AMLODIPINE BESYLATE 2.5 MG PO TABS
2.5000 mg | ORAL_TABLET | Freq: Every day | ORAL | 1 refills | Status: DC
  Filled 2021-05-11: qty 90, 90d supply, fill #0
  Filled 2021-08-14: qty 90, 90d supply, fill #1

## 2021-05-18 ENCOUNTER — Ambulatory Visit: Payer: BLUE CROSS/BLUE SHIELD | Admitting: Nurse Practitioner

## 2021-05-22 ENCOUNTER — Ambulatory Visit (INDEPENDENT_AMBULATORY_CARE_PROVIDER_SITE_OTHER): Payer: No Typology Code available for payment source | Admitting: Nurse Practitioner

## 2021-05-22 ENCOUNTER — Other Ambulatory Visit: Payer: Self-pay

## 2021-05-22 ENCOUNTER — Encounter: Payer: Self-pay | Admitting: Nurse Practitioner

## 2021-05-22 VITALS — BP 122/70 | HR 80 | Temp 97.6°F | Ht 64.0 in | Wt 192.0 lb

## 2021-05-22 DIAGNOSIS — R0683 Snoring: Secondary | ICD-10-CM | POA: Diagnosis not present

## 2021-05-22 DIAGNOSIS — R200 Anesthesia of skin: Secondary | ICD-10-CM

## 2021-05-22 DIAGNOSIS — G4733 Obstructive sleep apnea (adult) (pediatric): Secondary | ICD-10-CM | POA: Diagnosis not present

## 2021-05-22 DIAGNOSIS — I10 Essential (primary) hypertension: Secondary | ICD-10-CM

## 2021-05-22 DIAGNOSIS — E559 Vitamin D deficiency, unspecified: Secondary | ICD-10-CM

## 2021-05-22 DIAGNOSIS — G4709 Other insomnia: Secondary | ICD-10-CM

## 2021-05-22 DIAGNOSIS — R202 Paresthesia of skin: Secondary | ICD-10-CM

## 2021-05-22 DIAGNOSIS — D229 Melanocytic nevi, unspecified: Secondary | ICD-10-CM

## 2021-05-22 DIAGNOSIS — Z6832 Body mass index (BMI) 32.0-32.9, adult: Secondary | ICD-10-CM

## 2021-05-22 DIAGNOSIS — E7800 Pure hypercholesterolemia, unspecified: Secondary | ICD-10-CM

## 2021-05-22 NOTE — Patient Instructions (Signed)

## 2021-05-22 NOTE — Progress Notes (Signed)
I,Yamilka Roman [employer] as a Education administrator for [employer], FNP.,have documented all relevant documentation on the behalf of Minette Brine, FNP,as directed by  Minette Brine, FNP while in the presence of Minette Brine, [place].   This visit occurred during the SARS-CoV-2 public health emergency.  Safety protocols were in place, including screening questions prior to the visit, additional usage of staff PPE, and extensive cleaning of exam room while observing appropriate contact time as indicated for disinfecting solutions.  Subjective:     Patient ID: Ashley Summers , female    DOB: 1981-02-26 , 40 y.o.   MRN: [id number]   Chief Complaint  Patient presents with   Hypertension    HPI  Patient presents today for a blood pressure check.  She had stopped taking saxenda one month ago due to constipation and when she went up she felt more nausea. She is exercising Mon - Thu and on Sat - 30 minutes each time cardio and some squats, weights.   She is having bilateral hand tingling worsening causing discomfort with writing.    She is sleeping better and her son is back from [place].   Wt Readings from Last 3 Encounters: 05/22/21 : 192 lb (87.1 kg) 03/15/21 : 200 lb (90.7 kg) 02/09/21 : 196 lb (88.9 kg)    Hypertension This is a chronic problem. The current episode started more than 1 year ago. The problem is controlled. Associated symptoms include headaches. Pertinent negatives include no anxiety or peripheral edema. Risk factors for coronary artery disease include sedentary lifestyle. Past treatments include diuretics. There are no compliance problems.  There is no history of angina. There is no history of chronic renal disease.  Insomnia Primary symptoms: sleep disturbance.   The problem occurs intermittently.    Past Medical History:  Diagnosis Date   Allergy    Anxiety    Essential hypertension 09/09/2020   Hypertension    Palpitations 09/09/2020     Family History  Problem  Relation Age of Onset   Hypertension Mother    Stroke Mother    Pulmonary embolism Mother    Hypertension Brother    Hyperlipidemia Maternal Grandmother    Hypertension Maternal Grandmother      Current Outpatient Medications:    albuterol (VENTOLIN HFA) 108 (90 Base) MCG/ACT inhaler, INHALE 2 PUFFS INTO THE LUNGS EVERY 6 (SIX) HOURS AS NEEDED FOR WHEEZING OR SHORTNESS OF BREATH., Disp: 18 g, Rfl: 1   amLODipine (NORVASC) 2.5 MG tablet, Take 1 tablet (2.5 mg total) by mouth daily., Disp: 90 tablet, Rfl: 1   cetirizine (ZYRTEC) 10 MG tablet, Take by mouth., Disp: , Rfl:    cholecalciferol (VITAMIN D3) 25 MCG (1000 UNIT) tablet, Take 2,000 Units by mouth daily., Disp: , Rfl:    hydrochlorothiazide (HYDRODIURIL) 25 MG tablet, Take 1 tablet (25 mg total) by mouth daily., Disp: 90 tablet, Rfl: 1   hydrOXYzine (ATARAX/VISTARIL) 10 MG tablet, Take 1 tablet (10 mg total) by mouth every 6 (six) hours as needed for anxiety., Disp: 60 tablet, Rfl: 3   levonorgestrel (MIRENA) 20 MCG/24HR IUD, 1 each by Intrauterine route once., Disp: , Rfl:    Multiple Vitamin (MULTIVITAMIN) tablet, Take 1 tablet by mouth daily., Disp: , Rfl:    nystatin cream (MYCOSTATIN), APPLY 1 APPLICATION TOPICALLY 2 (TWO) TIMES DAILY. UNDER BREAST, Disp: 30 g, Rfl: 3   Allergies  Allergen Reactions   Penicillins Rash     Review of Systems  Neurological:  Positive for headaches.  Psychiatric/Behavioral:  Positive for sleep disturbance. The patient has insomnia.     Today's Vitals   05/22/21 1509  BP: 122/70  Pulse: 80  Temp: 97.6 F (36.4 C)  Weight: 192 lb (87.1 kg)  Height: 5' 4"  (1.626 m)  PainSc: 0-No pain   Body mass index is 32.96 kg/m.   Objective:  Physical Exam Constitutional:      General: She is not in acute distress.    Appearance: Normal appearance. She is obese.  Cardiovascular:     Rate and Rhythm: Normal rate and regular rhythm.     Pulses: Normal pulses.     Heart sounds: Normal heart  sounds. No murmur heard. Pulmonary:     Effort: Pulmonary effort is normal. No respiratory distress.     Breath sounds: Normal breath sounds. No wheezing.  Musculoskeletal:        General: Normal range of motion.     Right wrist: No swelling.     Left wrist: No swelling.     Cervical back: Normal range of motion and neck supple.     Comments: Positive phalen's   Skin:    General: Skin is warm and dry.     Capillary Refill: Capillary refill takes less than 2 seconds.  Neurological:     General: No focal deficit present.     Mental Status: She is alert and oriented to person, place, and time.  Psychiatric:        Mood and Affect: Mood normal.        Behavior: Behavior normal.        Thought Content: Thought content normal.        Judgment: Judgment normal.        Assessment And Plan:     1. Essential hypertension Comments: Chronic, well controlled continue with current medications - BMP8+eGFR; Future - BMP8+eGFR  2. Snoring Comments: she had a sleep study done shown mild sleep apnea and she would like to try the mouth device, will refer to dentist - Ambulatory referral to Dentistry  3. OSA (obstructive sleep apnea) - Ambulatory referral to Dentistry  4. Numbness and tingling in both hands Comments: positive phalen test, this has been ongoing and is now affecting daily lifestyle - Ambulatory referral to Hand Surgery  5. Nevus  6. Elevated LDL cholesterol level Comments: encouraged to avoid fried and fatty foods - Lipid panel; Future - Lipid panel  7. Vitamin D deficiency disease Comments: Chronic, will check vitamin d levels - VITAMIN D 25 Hydroxy (Vit-D Deficiency, Fractures)  8. Other insomnia Comments: This has improved since her last visit  9. BMI 32.0-32.9,adult Comments: encouraged to exercise at least 150 minutes a week eat a healthy diet low in sugar and starches     Patient was given opportunity to ask questions. Patient verbalized understanding of  the plan and was able to repeat key elements of the plan. All questions were answered to their satisfaction.  Minette Brine, FNP   I, Minette Brine, FNP, have reviewed all documentation for this visit. The documentation on 05/30/21 for the exam, diagnosis, procedures, and orders are all accurate and complete.   IF YOU HAVE BEEN REFERRED TO A SPECIALIST, IT MAY TAKE 1-2 WEEKS TO SCHEDULE/PROCESS THE REFERRAL. IF YOU HAVE NOT HEARD FROM US/SPECIALIST IN TWO WEEKS, PLEASE GIVE [place] A CALL AT [phone] X [phone].   THE PATIENT IS ENCOURAGED TO PRACTICE SOCIAL DISTANCING DUE TO THE COVID-19 PANDEMIC.

## 2021-05-31 LAB — VITAMIN D 25 HYDROXY (VIT D DEFICIENCY, FRACTURES): Vit D, 25-Hydroxy: 28.7 ng/mL — ABNORMAL LOW (ref 30.0–100.0)

## 2021-05-31 LAB — BMP8+EGFR
BUN/Creatinine Ratio: 14 (ref 9–23)
BUN: 16 mg/dL (ref 6–24)
CO2: 26 mmol/L (ref 20–29)
Calcium: 9.5 mg/dL (ref 8.7–10.2)
Chloride: 94 mmol/L — ABNORMAL LOW (ref 96–106)
Creatinine, Ser: 1.11 mg/dL — ABNORMAL HIGH (ref 0.57–1.00)
Glucose: 92 mg/dL (ref 65–99)
Potassium: 3.8 mmol/L (ref 3.5–5.2)
Sodium: 136 mmol/L (ref 134–144)
eGFR: 64 mL/min/{1.73_m2} (ref >59)

## 2021-05-31 LAB — LIPID PANEL
Chol/HDL Ratio: 2.8 ratio (ref 0.0–4.4)
Cholesterol, Total: 207 mg/dL — ABNORMAL HIGH (ref 100–199)
HDL: 73 mg/dL (ref >39)
LDL Chol Calc (NIH): 117 mg/dL — ABNORMAL HIGH (ref 0–99)
Triglycerides: 94 mg/dL (ref 0–149)
VLDL Cholesterol Cal: 17 mg/dL (ref 5–40)

## 2021-06-06 ENCOUNTER — Other Ambulatory Visit: Payer: Self-pay | Admitting: Nurse Practitioner

## 2021-06-06 ENCOUNTER — Other Ambulatory Visit: Payer: No Typology Code available for payment source

## 2021-06-06 DIAGNOSIS — D229 Melanocytic nevi, unspecified: Secondary | ICD-10-CM

## 2021-06-16 ENCOUNTER — Ambulatory Visit (INDEPENDENT_AMBULATORY_CARE_PROVIDER_SITE_OTHER): Payer: No Typology Code available for payment source | Admitting: Cardiovascular Disease

## 2021-06-16 ENCOUNTER — Encounter (HOSPITAL_BASED_OUTPATIENT_CLINIC_OR_DEPARTMENT_OTHER): Payer: Self-pay | Admitting: Cardiovascular Disease

## 2021-06-16 ENCOUNTER — Other Ambulatory Visit: Payer: Self-pay

## 2021-06-16 VITALS — BP 118/78 | HR 61 | Ht 64.0 in | Wt 197.4 lb

## 2021-06-16 DIAGNOSIS — R002 Palpitations: Secondary | ICD-10-CM

## 2021-06-16 DIAGNOSIS — G4733 Obstructive sleep apnea (adult) (pediatric): Secondary | ICD-10-CM

## 2021-06-16 DIAGNOSIS — I10 Essential (primary) hypertension: Secondary | ICD-10-CM

## 2021-06-16 NOTE — Assessment & Plan Note (Signed)
She recently ordered a dental device.

## 2021-06-16 NOTE — Progress Notes (Signed)
Cardiology Office Note   Date:  06/16/2021   ID:  Ashley, Summers [birth year], MRN [id number]  PCP:  Minette Brine, FNP  Cardiologist:   Skeet Latch, MD   No chief complaint on file.   History of Present Illness: Ashley Summers is a 40 y.o. female with hypertension, anxiety, and OSA who is being seen today for follow-up . She was initially seen 09/2020 for the evaluation of palpitations at the request of Minette Brine, [place].  Ashley Summers saw her PCP on 08/2020 and reported palpitations.  She had palpitations about 3 years ago and they subsided.  Over the last year she started having them again.  It was getting worse and lasting longer but hasn't felt any in the last month.  The last severe episode lasted for about four minutes.  It felt like her heart was dropping.  There is no associated shortness of breath or chest pain. Her episodes were infrequent so an ambulatory monitor was not ordered. Thyroid function and other labs were unremarkable. She had a sleep study 04/2021 that showed mild sleep apnea.  Today, she is feeling good overall. She continues to have palpitations "every once in a blue moon." They occur randomly and she may be sitting down. However, when the palpitations do occur, she knows they will occur again later that day. Then they won't occur for a while. Also, she still notices her heart rate is normally low around 56, and may be closer to 62 while walking at work. For exercise, she typically uses the elliptical. However, in the last 2 weeks she did not formally exercise due to her son visiting from the army. She denies any chest pain, shortness of breath, or exertional symptoms. No headaches, lightheadedness, or syncope to report. Also has no lower extremity edema, orthopnea or PND.   Past Medical History:  Diagnosis Date   Allergy    Anxiety    Essential hypertension 09/09/2020   Hypertension    Palpitations 09/09/2020    Past Surgical History:   Procedure Laterality Date   BREAST SURGERY     Reduction    Hammertoe correction       Current Outpatient Medications  Medication Sig Dispense Refill   amLODipine (NORVASC) 2.5 MG tablet Take 1 tablet (2.5 mg total) by mouth daily. 90 tablet 1   cetirizine (ZYRTEC) 10 MG tablet Take by mouth.     cholecalciferol (VITAMIN D3) 25 MCG (1000 UNIT) tablet Take 2,000 Units by mouth daily.     hydrochlorothiazide (HYDRODIURIL) 25 MG tablet Take 1 tablet (25 mg total) by mouth daily. 90 tablet 1   hydrOXYzine (ATARAX/VISTARIL) 10 MG tablet Take 1 tablet (10 mg total) by mouth every 6 (six) hours as needed for anxiety. 60 tablet 3   levonorgestrel (MIRENA) 20 MCG/24HR IUD 1 each by Intrauterine route once.     Multiple Vitamin (MULTIVITAMIN) tablet Take 1 tablet by mouth daily.     nystatin cream (MYCOSTATIN) APPLY 1 APPLICATION TOPICALLY 2 (TWO) TIMES DAILY. UNDER BREAST 30 g 3   No current facility-administered medications for this visit.    Allergies:   Penicillins    Social History:  The patient  reports that she has never smoked. She has never used smokeless tobacco. She reports current alcohol use. She reports that she does not use drugs.   Family History:  The patient's family history includes Hyperlipidemia in her maternal grandmother; Hypertension in her brother, maternal grandmother, and mother; Pulmonary embolism in  her mother; Stroke in her mother.    ROS:   Please see the history of present illness. (+) Palpitations All other systems are reviewed and negative.    PHYSICAL EXAM: VS:  BP 118/78 (BP Location: Right Arm, Patient Position: Sitting)   Pulse 61   Ht 5\' 4"  (1.626 m)   Wt 197 lb 6.4 oz (89.5 kg)   SpO2 98%   BMI 33.88 kg/m  , BMI Body mass index is 33.88 kg/m. GENERAL:  Well appearing HEENT:  Pupils equal round and reactive, fundi not visualized, oral mucosa unremarkable NECK:  No jugular venous distention, waveform within normal limits, carotid upstroke  brisk and symmetric, no bruits LUNGS:  Clear to auscultation bilaterally HEART:  RRR.  PMI not displaced or sustained,S1 and S2 within normal limits, no S3, no S4, no clicks, no rubs, no murmurs ABD:  Flat, positive bowel sounds normal in frequency in pitch, no bruits, no rebound, no guarding, no midline pulsatile mass, no hepatomegaly, no splenomegaly EXT:  2 plus pulses throughout, no edema, no cyanosis no clubbing SKIN:  No rashes no nodules NEURO:  Cranial nerves II through XII grossly intact, motor grossly intact throughout PSYCH:  Cognitively intact, oriented to person place and time   EKG:   06/16/2021: Sinus rhythm. Rate 61 bpm. 09/09/2020: sinus rhythm.  Rate 60 bpm   Recent Labs: 09/09/2020: TSH 1.420 10/17/2020: ALT 21; Hemoglobin 14.3; Platelets 203 05/30/2021: BUN 16; Creatinine, Ser 1.11; Potassium 3.8; Sodium 136    Lipid Panel    Component Value Date/Time   CHOL 207 (H) 05/30/2021 0910   TRIG 94 05/30/2021 0910   HDL 73 05/30/2021 0910   CHOLHDL 2.8 05/30/2021 0910   CHOLHDL 4 11/18/2019 0908   VLDL 24.6 11/18/2019 0908   LDLCALC 117 (H) 05/30/2021 0910      Wt Readings from Last 3 Encounters:  06/16/21 197 lb 6.4 oz (89.5 kg)  05/22/21 192 lb (87.1 kg)  03/15/21 200 lb (90.7 kg)      ASSESSMENT AND PLAN: Essential hypertension BP was initially elevated but better on repeat.  Continue amlodipine and HCTZ.  OSA (obstructive sleep apnea) She recently ordered a dental device.  Palpitations Stable.  She has rare episodes.  She was encouraged to keep a diary to figure out her triggers.  Sounds like PACs or PVCs too infrequent and brief for ambulatory monitoring.   Current medicines are reviewed at length with the patient today.  The patient does not have concerns regarding medicines.  The following changes have been made:  no change  Labs/ tests ordered today include:   Orders Placed This Encounter  Procedures   EKG 12-Lead     Disposition:   FU  with Verdelle Valtierra C. Oval Linsey, MD, [facility] as needed.   I,Mathew Stumpf,acting as a Education administrator for Skeet Latch, MD.,have documented all relevant documentation on the behalf of Skeet Latch, MD,as directed by  Skeet Latch, MD while in the presence of Skeet Latch, MD.  I, [place] Oval Linsey, MD have reviewed all documentation for this visit.  The documentation of the exam, diagnosis, procedures, and orders on 06/16/2021 are all accurate and complete.   Signed, Kelda Azad C. Oval Linsey, MD, [facility]  06/16/2021 1:43 PM    [place]

## 2021-06-16 NOTE — Patient Instructions (Signed)
Medication Instructions:  ?Your physician recommends that you continue on your current medications as directed. Please refer to the Current Medication list given to you today.  ? ?Labwork: ?NONE ? ?Testing/Procedures: ?NONE ? ?Follow-Up: ?AS NEEDED  ? ?  ?

## 2021-06-16 NOTE — Assessment & Plan Note (Signed)
BP was initially elevated but better on repeat.  Continue amlodipine and HCTZ.

## 2021-06-16 NOTE — Assessment & Plan Note (Signed)
Stable.  She has rare episodes.  She was encouraged to keep a diary to figure out her triggers.  Sounds like PACs or PVCs too infrequent and brief for ambulatory monitoring.

## 2021-06-22 NOTE — Telephone Encounter (Signed)
Received message from Dr. Kae Heller office requesting an RX signed and faxed over for  Oral appliance. I will place the form in pod 4 to be signed by Dr. Rexene Alberts when she returns.

## 2021-06-22 NOTE — Telephone Encounter (Signed)
In your inbox for signature.

## 2021-06-26 NOTE — Telephone Encounter (Signed)
Statement of medical necessity RX form completed, signed, and faxed back to Dr Kae Heller office along with requested information (office note, sleep study report, insurance and demographic information). Received a receipt of confirmation.

## 2021-06-28 ENCOUNTER — Other Ambulatory Visit (HOSPITAL_COMMUNITY): Payer: Self-pay

## 2021-06-29 ENCOUNTER — Encounter: Payer: Self-pay | Admitting: Neurology

## 2021-08-02 ENCOUNTER — Encounter: Payer: No Typology Code available for payment source | Admitting: Neurology

## 2021-08-14 ENCOUNTER — Other Ambulatory Visit (HOSPITAL_COMMUNITY): Payer: Self-pay

## 2021-09-13 ENCOUNTER — Other Ambulatory Visit: Payer: Self-pay | Admitting: Obstetrics and Gynecology

## 2021-09-13 DIAGNOSIS — Z01419 Encounter for gynecological examination (general) (routine) without abnormal findings: Secondary | ICD-10-CM

## 2021-09-15 ENCOUNTER — Other Ambulatory Visit: Payer: Self-pay

## 2021-09-15 ENCOUNTER — Other Ambulatory Visit (HOSPITAL_COMMUNITY): Payer: Self-pay

## 2021-09-15 MED ORDER — SAXENDA 18 MG/3ML ~~LOC~~ SOPN
0.6000 mg | PEN_INJECTOR | SUBCUTANEOUS | 0 refills | Status: DC
  Filled 2021-09-15: qty 3, 90d supply, fill #0
  Filled 2021-09-22 (×2): qty 3, 30d supply, fill #0

## 2021-09-22 ENCOUNTER — Other Ambulatory Visit: Payer: Self-pay

## 2021-09-22 ENCOUNTER — Other Ambulatory Visit (HOSPITAL_COMMUNITY): Payer: Self-pay

## 2021-09-22 MED ORDER — SAXENDA 18 MG/3ML ~~LOC~~ SOPN
3.0000 mg | PEN_INJECTOR | Freq: Every day | SUBCUTANEOUS | 3 refills | Status: DC
  Filled 2021-09-22: qty 15, 30d supply, fill #0
  Filled 2021-11-18: qty 15, 30d supply, fill #1
  Filled 2021-12-22: qty 15, 30d supply, fill #2

## 2021-09-25 ENCOUNTER — Other Ambulatory Visit (HOSPITAL_COMMUNITY): Payer: Self-pay

## 2021-09-26 ENCOUNTER — Other Ambulatory Visit (HOSPITAL_COMMUNITY): Payer: Self-pay

## 2021-09-27 ENCOUNTER — Ambulatory Visit: Payer: No Typology Code available for payment source | Admitting: Nurse Practitioner

## 2021-10-04 ENCOUNTER — Other Ambulatory Visit (HOSPITAL_COMMUNITY): Payer: Self-pay

## 2021-10-04 ENCOUNTER — Other Ambulatory Visit: Payer: Self-pay

## 2021-10-04 DIAGNOSIS — I10 Essential (primary) hypertension: Secondary | ICD-10-CM

## 2021-10-04 MED ORDER — HYDROCHLOROTHIAZIDE 25 MG PO TABS
25.0000 mg | ORAL_TABLET | Freq: Every day | ORAL | 1 refills | Status: DC
  Filled 2021-10-04: qty 90, 90d supply, fill #0

## 2021-10-05 ENCOUNTER — Ambulatory Visit: Payer: No Typology Code available for payment source | Admitting: Nurse Practitioner

## 2021-10-10 ENCOUNTER — Ambulatory Visit: Payer: No Typology Code available for payment source | Admitting: Nurse Practitioner

## 2021-10-12 ENCOUNTER — Other Ambulatory Visit: Payer: Self-pay

## 2021-10-12 ENCOUNTER — Encounter: Payer: Self-pay | Admitting: Nurse Practitioner

## 2021-10-12 ENCOUNTER — Other Ambulatory Visit (HOSPITAL_COMMUNITY): Payer: Self-pay

## 2021-10-12 ENCOUNTER — Ambulatory Visit (INDEPENDENT_AMBULATORY_CARE_PROVIDER_SITE_OTHER): Payer: No Typology Code available for payment source | Admitting: Nurse Practitioner

## 2021-10-12 VITALS — BP 124/86 | HR 72 | Temp 98.4°F | Ht 64.0 in | Wt 188.0 lb

## 2021-10-12 DIAGNOSIS — Z6832 Body mass index (BMI) 32.0-32.9, adult: Secondary | ICD-10-CM

## 2021-10-12 DIAGNOSIS — I10 Essential (primary) hypertension: Secondary | ICD-10-CM

## 2021-10-12 DIAGNOSIS — F419 Anxiety disorder, unspecified: Secondary | ICD-10-CM | POA: Diagnosis not present

## 2021-10-12 MED ORDER — HYDROCHLOROTHIAZIDE 25 MG PO TABS
25.0000 mg | ORAL_TABLET | Freq: Every day | ORAL | 1 refills | Status: DC
  Filled 2021-10-12 – 2022-01-16 (×2): qty 90, 90d supply, fill #0
  Filled 2022-05-02: qty 90, 90d supply, fill #1

## 2021-10-12 MED ORDER — HYDROXYZINE HCL 10 MG PO TABS
10.0000 mg | ORAL_TABLET | Freq: Four times a day (QID) | ORAL | 3 refills | Status: DC | PRN
  Filled 2021-10-12: qty 60, 15d supply, fill #0

## 2021-10-12 MED ORDER — AMLODIPINE BESYLATE 2.5 MG PO TABS
2.5000 mg | ORAL_TABLET | Freq: Every day | ORAL | 1 refills | Status: DC
  Filled 2021-10-12 – 2021-11-18 (×2): qty 90, 90d supply, fill #0
  Filled 2022-03-14: qty 90, 90d supply, fill #1

## 2021-10-12 MED ORDER — AMLODIPINE BESYLATE 2.5 MG PO TABS
2.5000 mg | ORAL_TABLET | Freq: Every day | ORAL | 1 refills | Status: DC
  Filled 2021-10-12: qty 90, 90d supply, fill #0

## 2021-10-12 MED ORDER — HYDROXYZINE HCL 10 MG PO TABS
10.0000 mg | ORAL_TABLET | Freq: Four times a day (QID) | ORAL | 2 refills | Status: DC | PRN
  Filled 2021-10-12: qty 60, 15d supply, fill #0
  Filled 2022-05-02: qty 60, 15d supply, fill #1

## 2021-10-12 NOTE — Patient Instructions (Signed)

## 2021-10-12 NOTE — Progress Notes (Signed)
I,Katawbba Wiggins,acting as a Education administrator for [employer], NP.,have documented all relevant documentation on the behalf of [employer], NP,as directed by  Bary Castilla, NP while in the presence of Bary Castilla, NP.  This visit occurred during the SARS-CoV-2 public health emergency.  Safety protocols were in place, including screening questions prior to the visit, additional usage of staff PPE, and extensive cleaning of exam room while observing appropriate contact time as indicated for disinfecting solutions.  Subjective:     Patient ID: Ashley Summers , female    DOB: 01/16/1981 , 40 y.o.   MRN: [id number]   Chief Complaint  Patient presents with   Hypertension    HPI  Patient presents today for a blood pressure check.   She is dong otherwise. She would like some refills for her medication. She will come back in few weeks for her physical where she is going to get all of her blood work done.   Hypertension This is a chronic problem. The current episode started more than 1 year ago. The problem is controlled. Associated symptoms include headaches. Pertinent negatives include no anxiety, chest pain, palpitations, peripheral edema or shortness of breath. Risk factors for coronary artery disease include sedentary lifestyle. Past treatments include diuretics. There are no compliance problems.  There is no history of angina. There is no history of chronic renal disease.  Insomnia Primary symptoms: sleep disturbance.   The problem occurs intermittently.    Past Medical History:  Diagnosis Date   Allergy    Anxiety    Essential hypertension 09/09/2020   Hypertension    Palpitations 09/09/2020     Family History  Problem Relation Age of Onset   Hypertension Mother    Stroke Mother    Pulmonary embolism Mother    Hypertension Brother    Hyperlipidemia Maternal Grandmother    Hypertension Maternal Grandmother      Current Outpatient Medications:    cetirizine  (ZYRTEC) 10 MG tablet, Take by mouth., Disp: , Rfl:    cholecalciferol (VITAMIN D3) 25 MCG (1000 UNIT) tablet, Take 2,000 Units by mouth daily., Disp: , Rfl:    hydrochlorothiazide (HYDRODIURIL) 25 MG tablet, Take 1 tablet (25 mg total) by mouth daily., Disp: 90 tablet, Rfl: 1   levonorgestrel (MIRENA) 20 MCG/24HR IUD, 1 each by Intrauterine route once., Disp: , Rfl:    Liraglutide -Weight Management (SAXENDA) 18 MG/3ML SOPN, Inject 3 mg into the skin daily at 6 (six) AM., Disp: 9 mL, Rfl: 3   Multiple Vitamin (MULTIVITAMIN) tablet, Take 1 tablet by mouth daily., Disp: , Rfl:    nystatin cream (MYCOSTATIN), APPLY 1 APPLICATION TOPICALLY 2 (TWO) TIMES DAILY. UNDER BREAST, Disp: 30 g, Rfl: 3   amLODipine (NORVASC) 2.5 MG tablet, Take 1 tablet (2.5 mg total) by mouth daily., Disp: 90 tablet, Rfl: 1   hydrOXYzine (ATARAX/VISTARIL) 10 MG tablet, Take 1 tablet (10 mg total) by mouth every 6 (six) hours as needed for anxiety., Disp: 60 tablet, Rfl: 2   Allergies  Allergen Reactions   Penicillins Rash     Review of Systems  Constitutional: Negative.  Negative for chills and fever.  HENT:  Negative for congestion and rhinorrhea.   Respiratory: Negative.  Negative for cough, shortness of breath and wheezing.   Cardiovascular: Negative.  Negative for chest pain and palpitations.  Gastrointestinal: Negative.  Negative for abdominal distention, constipation and diarrhea.  Musculoskeletal:  Negative for arthralgias and myalgias.  Neurological:  Positive for headaches.  Psychiatric/Behavioral:  Positive  for sleep disturbance. The patient has insomnia.   All other systems reviewed and are negative.   Today's Vitals   10/12/21 1208  BP: 124/86  Pulse: 72  Temp: 98.4 F (36.9 C)  Weight: 188 lb (85.3 kg)  Height: 5\' 4"  (1.626 m)   Body mass index is 32.27 kg/m.  Wt Readings from Last 3 Encounters:  10/12/21 188 lb (85.3 kg)  06/16/21 197 lb 6.4 oz (89.5 kg)  05/22/21 192 lb (87.1 kg)    BP  Readings from Last 3 Encounters:  10/12/21 124/86  06/16/21 118/78  05/22/21 122/70    Objective:  Physical Exam Constitutional:      Appearance: Normal appearance.  HENT:     Head: Normocephalic and atraumatic.  Cardiovascular:     Rate and Rhythm: Normal rate and regular rhythm.     Pulses: Normal pulses.     Heart sounds: Normal heart sounds. No murmur heard. Pulmonary:     Effort: Pulmonary effort is normal. No respiratory distress.     Breath sounds: Normal breath sounds. No wheezing.  Skin:    General: Skin is warm and dry.     Capillary Refill: Capillary refill takes less than 2 seconds.  Neurological:     Mental Status: She is alert.        Assessment And Plan:     1. Essential hypertension -Stable, chronic, Continue meds  -She will return for lab work in a few weeks with her physical.  - amLODipine (NORVASC) 2.5 MG tablet; Take 1 tablet (2.5 mg total) by mouth daily.  Dispense: 90 tablet; Refill: 1  2. Anxiety - hydrOXYzine (ATARAX/VISTARIL) 10 MG tablet; Take 1 tablet (10 mg total) by mouth every 6 (six) hours as needed for anxiety.  Dispense: 60 tablet; Refill: 2  3. BMI 32.0-32.9,adult  -Advised patient on a healthy diet including avoiding fast food and red meats. Increase the intake of lean meats including grilled chicken and [place].  Drink a lot of water. Decrease intake of fatty foods. Exercise for 30-45 min. 4-5 a week to decrease the risk of cardiac event.   The patient was encouraged to call or send a message through [place] for any questions or concerns.   Follow up: if symptoms persist or do not get better.   Side effects and appropriate use of all the medication(s) were discussed with the patient today. Patient advised to use the medication(s) as directed by their healthcare provider. The patient was encouraged to read, review, and understand all associated package inserts and contact our office with any questions or concerns. The patient accepts the  risks of the treatment plan and had an opportunity to ask questions.   Staying healthy and adopting a healthy lifestyle for your overall health is important. You should eat 7 or more servings of fruits and vegetables per day. You should drink plenty of water to keep yourself hydrated and your kidneys healthy. This includes about 65-80+ fluid ounces of water. Limit your intake of animal fats especially for elevated cholesterol. Avoid highly processed food and limit your salt intake if you have hypertension. Avoid foods high in saturated/Trans fats. Along with a healthy diet it is also very important to maintain time for yourself to maintain a healthy mental health with low stress levels. You should get atleast 150 min of moderate intensity exercise weekly for a healthy heart. Along with eating right and exercising, aim for at least 7-9 hours of sleep daily.  Eat more whole grains  which includes barley, wheat berries, oats, brown rice and whole wheat pasta. Use healthy plant oils which include olive, soy, corn, sunflower and peanut. Limit your caffeine and sugary drinks. Limit your intake of fast foods. Limit milk and dairy products to one or two daily servings.   Patient was given opportunity to ask questions. Patient verbalized understanding of the plan and was able to repeat key elements of the plan. All questions were answered to their satisfaction.  Raman Raeanna Soberanes, DNP   I, Raman Ronnetta Currington have reviewed all documentation for this visit. The documentation on 10/12/21 for the exam, diagnosis, procedures, and orders are all accurate and complete.    IF YOU HAVE BEEN REFERRED TO A SPECIALIST, IT MAY TAKE 1-2 WEEKS TO SCHEDULE/PROCESS THE REFERRAL. IF YOU HAVE NOT HEARD FROM US/SPECIALIST IN TWO WEEKS, PLEASE GIVE [place] A CALL AT [phone] X [phone].   THE PATIENT IS ENCOURAGED TO PRACTICE SOCIAL DISTANCING DUE TO THE COVID-19 PANDEMIC.

## 2021-11-03 ENCOUNTER — Ambulatory Visit: Payer: No Typology Code available for payment source

## 2021-11-03 ENCOUNTER — Other Ambulatory Visit: Payer: Self-pay

## 2021-11-07 ENCOUNTER — Other Ambulatory Visit: Payer: Self-pay

## 2021-11-07 ENCOUNTER — Encounter: Payer: Self-pay | Admitting: Nurse Practitioner

## 2021-11-07 ENCOUNTER — Ambulatory Visit (INDEPENDENT_AMBULATORY_CARE_PROVIDER_SITE_OTHER): Payer: No Typology Code available for payment source | Admitting: Nurse Practitioner

## 2021-11-07 VITALS — BP 128/76 | HR 70 | Temp 98.9°F | Ht 64.0 in | Wt 189.0 lb

## 2021-11-07 DIAGNOSIS — E6609 Other obesity due to excess calories: Secondary | ICD-10-CM

## 2021-11-07 DIAGNOSIS — I10 Essential (primary) hypertension: Secondary | ICD-10-CM | POA: Diagnosis not present

## 2021-11-07 DIAGNOSIS — R82998 Other abnormal findings in urine: Secondary | ICD-10-CM | POA: Diagnosis not present

## 2021-11-07 DIAGNOSIS — Z23 Encounter for immunization: Secondary | ICD-10-CM | POA: Diagnosis not present

## 2021-11-07 DIAGNOSIS — Z0001 Encounter for general adult medical examination with abnormal findings: Secondary | ICD-10-CM

## 2021-11-07 DIAGNOSIS — E559 Vitamin D deficiency, unspecified: Secondary | ICD-10-CM

## 2021-11-07 DIAGNOSIS — Z13228 Encounter for screening for other metabolic disorders: Secondary | ICD-10-CM

## 2021-11-07 DIAGNOSIS — Z0000 Encounter for general adult medical examination without abnormal findings: Secondary | ICD-10-CM

## 2021-11-07 DIAGNOSIS — Z6832 Body mass index (BMI) 32.0-32.9, adult: Secondary | ICD-10-CM

## 2021-11-07 LAB — POCT URINALYSIS DIPSTICK
Bilirubin, UA: NEGATIVE
Glucose, UA: NEGATIVE
Ketones, UA: NEGATIVE
Nitrite, UA: NEGATIVE
Protein, UA: NEGATIVE
Spec Grav, UA: 1.02 (ref 1.010–1.025)
Urobilinogen, UA: 0.2 E.U./dL
pH, UA: 7 (ref 5.0–8.0)

## 2021-11-07 LAB — POCT UA - MICROALBUMIN
Albumin/Creatinine Ratio, Urine, POC: <30
Creatinine, POC: 300 mg/dL
Microalbumin Ur, POC: 10 mg/L

## 2021-11-07 NOTE — Progress Notes (Signed)
I,Tianna Badgett,acting as a Education administrator for [employer], NP.,have documented all relevant documentation on the behalf of [employer], NP,as directed by  Bary Castilla, NP while in the presence of Bary Castilla, NP.  This visit occurred during the SARS-CoV-2 public health emergency.  Safety protocols were in place, including screening questions prior to the visit, additional usage of staff PPE, and extensive cleaning of exam room while observing appropriate contact time as indicated for disinfecting solutions.  Subjective:     Patient ID: Ashley Summers , female    DOB: [birth year] , 40 y.o.   MRN: [id number]   Chief Complaint  Patient presents with   Annual Exam    HPI  Patient presents today for a physical exam. She has a mammogram appt this month  Diet: She is eating a little bit better . Exercise: She is exercising  She drinks occasionally and does not smoke.   Hypertension This is a chronic problem. The current episode started more than 1 year ago. The problem is controlled. Pertinent negatives include no anxiety, chest pain, palpitations, peripheral edema or shortness of breath. Risk factors for coronary artery disease include sedentary lifestyle. Past treatments include diuretics. There are no compliance problems.  There is no history of angina. There is no history of chronic renal disease.  Insomnia The problem occurs intermittently.    Past Medical History:  Diagnosis Date   Allergy    Anxiety    Essential hypertension 09/09/2020   Hypertension    Palpitations 09/09/2020    Family History  Problem Relation Age of Onset   Hypertension Mother    Stroke Mother    Pulmonary embolism Mother    Hypertension Brother    Hyperlipidemia Maternal Grandmother    Hypertension Maternal Grandmother     Current Outpatient Medications:    amLODipine (NORVASC) 2.5 MG tablet, Take 1 tablet (2.5 mg total) by mouth daily., Disp: 90 tablet, Rfl: 1   cetirizine  (ZYRTEC) 10 MG tablet, Take by mouth., Disp: , Rfl:    cholecalciferol (VITAMIN D3) 25 MCG (1000 UNIT) tablet, Take 2,000 Units by mouth daily., Disp: , Rfl:    hydrochlorothiazide (HYDRODIURIL) 25 MG tablet, Take 1 tablet (25 mg total) by mouth daily., Disp: 90 tablet, Rfl: 1   hydrOXYzine (ATARAX/VISTARIL) 10 MG tablet, Take 1 tablet (10 mg total) by mouth every 6 (six) hours as needed for anxiety., Disp: 60 tablet, Rfl: 2   levonorgestrel (MIRENA) 20 MCG/24HR IUD, 1 each by Intrauterine route once., Disp: , Rfl:    Liraglutide -Weight Management (SAXENDA) 18 MG/3ML SOPN, Inject 3 mg into the skin daily at 6 (six) AM., Disp: 9 mL, Rfl: 3   Multiple Vitamin (MULTIVITAMIN) tablet, Take 1 tablet by mouth daily., Disp: , Rfl:    nystatin cream (MYCOSTATIN), APPLY 1 APPLICATION TOPICALLY 2 (TWO) TIMES DAILY. UNDER BREAST, Disp: 30 g, Rfl: 3   Allergies  Allergen Reactions   Penicillins Rash      The patient states she uses IUD for birth control. Last LMP was No LMP recorded (lmp unknown). (Menstrual status: IUD).. Negative for Dysmenorrhea. Negative for: breast discharge, breast lump(s), breast pain and breast self exam. Associated symptoms include abnormal vaginal bleeding. Pertinent negatives include abnormal bleeding (hematology), anxiety, decreased libido, depression, difficulty falling sleep, dyspareunia, history of infertility, nocturia, sexual dysfunction, sleep disturbances, urinary incontinence, urinary urgency, vaginal discharge and vaginal itching. Diet regular.The patient states her exercise level is    . The patient's tobacco use is:  Social  History   Tobacco Use  Smoking Status Never  Smokeless Tobacco Never  . She has been exposed to passive smoke. The patient's alcohol use is:  Social History   Substance and Sexual Activity  Alcohol Use Yes   Alcohol/week: 0.0 standard drinks   Comment: occasional  . Additional information: Last pap , next one scheduled for .2022  Review  of Systems  Constitutional: Negative.  Negative for chills and fever.  HENT: Negative.  Negative for congestion, rhinorrhea and sneezing.   Eyes:  Negative for visual disturbance.  Respiratory: Negative.  Negative for shortness of breath and wheezing.   Cardiovascular: Negative.  Negative for chest pain and palpitations.  Gastrointestinal: Negative.  Negative for abdominal pain, constipation, diarrhea and vomiting.  Endocrine: Negative.   Genitourinary: Negative.   Musculoskeletal: Negative.   Skin: Negative.   Allergic/Immunologic: Negative.   Neurological: Negative.  Negative for weakness.  Hematological: Negative.   Psychiatric/Behavioral: Negative.  The patient has insomnia.     Today's Vitals   11/07/21 1448  BP: 128/76  Pulse: 70  Temp: 98.9 F (37.2 C)  TempSrc: Oral  Weight: 189 lb (85.7 kg)  Height: _0  (1.626 m)   Body mass index is 32.44 kg/m.  Wt Readings from Last 3 Encounters:  11/07/21 189 lb (85.7 kg)  10/12/21 188 lb (85.3 kg)  06/16/21 197 lb 6.4 oz (89.5 kg)    Objective:  Physical Exam Vitals and nursing note reviewed.  Constitutional:      Appearance: Normal appearance.  HENT:     Head: Normocephalic and atraumatic.     Right Ear: Tympanic membrane, ear canal and external ear normal.     Left Ear: Tympanic membrane, ear canal and external ear normal.     Nose:     Comments: Masked     Mouth/Throat:     Comments: Masked  Eyes:     Extraocular Movements: Extraocular movements intact.     Conjunctiva/sclera: Conjunctivae normal.     Pupils: Pupils are equal, round, and reactive to light.  Cardiovascular:     Rate and Rhythm: Normal rate and regular rhythm.     Pulses: Normal pulses.     Heart sounds: Normal heart sounds. No murmur heard. Pulmonary:     Effort: No respiratory distress.     Breath sounds: Normal breath sounds. No wheezing.  Chest:  Breasts:    Tanner Score is 5.     Right: Normal.     Left: Normal.  Abdominal:      General: Abdomen is flat. Bowel sounds are normal.     Palpations: Abdomen is soft.  Genitourinary:    Comments: deferred Musculoskeletal:        General: Normal range of motion.     Cervical back: Normal range of motion and neck supple.  Skin:    General: Skin is warm and dry.     Capillary Refill: Capillary refill takes less than 2 seconds.  Neurological:     General: No focal deficit present.     Mental Status: She is alert and oriented to person, place, and time.  Psychiatric:        Mood and Affect: Mood normal.        Behavior: Behavior normal.     Assessment And Plan:     1. Encounter for annual physical exam --Patient is here for their annual physical exam and we discussed any changes to medication and medical history.  -Behavior modification was discussed as  well as diet and exercise history  -Patient will continue to exercise regularly and modify their diet.  -Recommendation for yearly physical annuals, immunization and screenings including mammogram and colonoscopy were discussed with the patient.  -Recommended intake of multivitamin, vitamin D and calcium.  -Individualized advise was given to the patient pertaining to their own health history in regards to diet, exercise, medical condition and referrals.   2. Essential hypertension -Continue meds  - POCT Urinalysis Dipstick (81002) - POCT UA - Microalbumin - CBC - Hemoglobin A1c - CMP14+EGFR  3. Encounter for screening for metabolic disorder - Hemoglobin A1c - Lipid panel  4. Vitamin D deficiency -Will check and supplement if needed. Advised patient to spend atleast 15 min. Daily in sunlight.  - Vitamin D (25 hydroxy)  5. Class 1 obesity due to excess calories with serious comorbidity and body mass index (BMI) of 32.0 to 32.9 in adult -Continue Saxenda. She is doing good with it.  -Advised patient on a healthy diet including avoiding fast food and red meats. Increase the intake of lean meats including grilled  chicken and [place].  Drink a lot of water. Decrease intake of fatty foods. Exercise for 30-45 min. 4-5 a week to decrease the risk of cardiac event.   6. Need for Tdap vaccination - Tdap vaccine greater than or equal to 7yo IM  7. Leukocytes in urine - Culture, Urine   The patient was encouraged to call or send a message through [facility] for any questions or concerns.   Follow up: if symptoms persist or do not get better.   Side effects and appropriate use of all the medication(s) were discussed with the patient today. Patient advised to use the medication(s) as directed by their healthcare provider. The patient was encouraged to read, review, and understand all associated package inserts and contact our office with any questions or concerns. The patient accepts the risks of the treatment plan and had an opportunity to ask questions.   Staying healthy and adopting a healthy lifestyle for your overall health is important. You should eat 7 or more servings of fruits and vegetables per day. You should drink plenty of water to keep yourself hydrated and your kidneys healthy. This includes about 65-80+ fluid ounces of water. Limit your intake of animal fats especially for elevated cholesterol. Avoid highly processed food and limit your salt intake if you have hypertension. Avoid foods high in saturated/Trans fats. Along with a healthy diet it is also very important to maintain time for yourself to maintain a healthy mental health with low stress levels. You should get atleast 150 min of moderate intensity exercise weekly for a healthy heart. Along with eating right and exercising, aim for at least 7-9 hours of sleep daily.  Eat more whole grains which includes barley, wheat berries, oats, brown rice and whole wheat pasta. Use healthy plant oils which include olive, soy, corn, sunflower and peanut. Limit your caffeine and sugary drinks. Limit your intake of fast foods. Limit milk and dairy products to one or  two daily servings.   Patient was given opportunity to ask questions. Patient verbalized understanding of the plan and was able to repeat key elements of the plan. All questions were answered to their satisfaction.  Raman Marchetta Navratil, DNP   I, Raman Troi Bechtold have reviewed all documentation for this visit. The documentation on 11/07/21 for the exam, diagnosis, procedures, and orders are all accurate and complete.    THE PATIENT IS ENCOURAGED TO PRACTICE SOCIAL DISTANCING  DUE TO THE COVID-19 PANDEMIC.

## 2021-11-07 NOTE — Patient Instructions (Signed)

## 2021-11-08 LAB — CMP14+EGFR
ALT: 19 IU/L (ref 0–32)
AST: 18 IU/L (ref 0–40)
Albumin/Globulin Ratio: 1.7 (ref 1.2–2.2)
Albumin: 4.6 g/dL (ref 3.8–4.8)
Alkaline Phosphatase: 64 IU/L (ref 44–121)
BUN/Creatinine Ratio: 17 (ref 9–23)
BUN: 15 mg/dL (ref 6–24)
Bilirubin Total: 0.7 mg/dL (ref 0.0–1.2)
CO2: 26 mmol/L (ref 20–29)
Calcium: 9.7 mg/dL (ref 8.7–10.2)
Chloride: 98 mmol/L (ref 96–106)
Creatinine, Ser: 0.88 mg/dL (ref 0.57–1.00)
Globulin, Total: 2.7 g/dL (ref 1.5–4.5)
Glucose: 80 mg/dL (ref 70–99)
Potassium: 3.5 mmol/L (ref 3.5–5.2)
Sodium: 139 mmol/L (ref 134–144)
Total Protein: 7.3 g/dL (ref 6.0–8.5)
eGFR: 84 mL/min/{1.73_m2} (ref >59)

## 2021-11-08 LAB — CBC
Hematocrit: 41 % (ref 34.0–46.6)
Hemoglobin: 13.9 g/dL (ref 11.1–15.9)
MCH: 31.4 pg (ref 26.6–33.0)
MCHC: 33.9 g/dL (ref 31.5–35.7)
MCV: 93 fL (ref 79–97)
Platelets: 201 10*3/uL (ref 150–450)
RBC: 4.42 x10E6/uL (ref 3.77–5.28)
RDW: 13 % (ref 11.7–15.4)
WBC: 5.3 10*3/uL (ref 3.4–10.8)

## 2021-11-08 LAB — HEMOGLOBIN A1C
Est. average glucose Bld gHb Est-mCnc: 103 mg/dL
Hgb A1c MFr Bld: 5.2 % (ref 4.8–5.6)

## 2021-11-08 LAB — VITAMIN D 25 HYDROXY (VIT D DEFICIENCY, FRACTURES): Vit D, 25-Hydroxy: 30.6 ng/mL (ref 30.0–100.0)

## 2021-11-08 LAB — LIPID PANEL
Chol/HDL Ratio: 3.2 ratio (ref 0.0–4.4)
Cholesterol, Total: 198 mg/dL (ref 100–199)
HDL: 62 mg/dL (ref >39)
LDL Chol Calc (NIH): 118 mg/dL — ABNORMAL HIGH (ref 0–99)
Triglycerides: 100 mg/dL (ref 0–149)
VLDL Cholesterol Cal: 18 mg/dL (ref 5–40)

## 2021-11-20 ENCOUNTER — Other Ambulatory Visit (HOSPITAL_COMMUNITY): Payer: Self-pay

## 2021-11-20 MED ORDER — INSULIN PEN NEEDLE 31G X 5 MM MISC
0 refills | Status: DC
  Filled 2021-11-20: qty 100, 90d supply, fill #0

## 2021-11-21 ENCOUNTER — Other Ambulatory Visit: Payer: Self-pay

## 2021-11-21 ENCOUNTER — Other Ambulatory Visit (HOSPITAL_COMMUNITY): Payer: Self-pay

## 2021-11-21 ENCOUNTER — Encounter: Payer: Self-pay | Admitting: Plastic Surgery

## 2021-11-21 ENCOUNTER — Ambulatory Visit (INDEPENDENT_AMBULATORY_CARE_PROVIDER_SITE_OTHER): Payer: No Typology Code available for payment source | Admitting: Plastic Surgery

## 2021-11-21 VITALS — Wt 191.0 lb

## 2021-11-21 DIAGNOSIS — M793 Panniculitis, unspecified: Secondary | ICD-10-CM

## 2021-11-21 MED ORDER — INSULIN PEN NEEDLE 31G X 5 MM MISC
1 refills | Status: DC
  Filled 2021-11-21: qty 100, 90d supply, fill #0
  Filled 2021-11-21: qty 100, 30d supply, fill #0

## 2021-11-21 NOTE — Progress Notes (Signed)
° °  Subjective:    Patient ID: Ashley Summers, female    DOB: 02-11-1981, 40 y.o.   MRN: [id number]  The patient is a 40 yrs old female here for re-evaluation of her abdomen.  She complains of panniculitis with continual rashes and skin breakdown.  She did not get improvement with physical therapy.  She is 5 feet 4 inches tall and weighs 191 pounds.    She does not smoke and does not have DM.  We will need a Hemoglobin A1C within 3 months of the time of surgery. Her past surgical history includes c-section. She has 3 kids.  Her main concern is back pain and rashes.     Review of Systems  Constitutional: Negative.   Eyes: Negative.   Respiratory: Negative.    Cardiovascular: Negative.   Gastrointestinal: Negative.   Endocrine: Negative.   Genitourinary: Negative.   Musculoskeletal: Negative.   Hematological: Negative.   Psychiatric/Behavioral: Negative.        Objective:   Physical Exam Constitutional:      Appearance: Normal appearance.  Cardiovascular:     Rate and Rhythm: Normal rate.     Pulses: Normal pulses.  Pulmonary:     Effort: Pulmonary effort is normal.  Abdominal:     General: There is no distension.     Palpations: Abdomen is soft.     Tenderness: There is no abdominal tenderness.  Skin:    General: Skin is warm.     Capillary Refill: Capillary refill takes less than 2 seconds.  Neurological:     Mental Status: She is alert and oriented to person, place, and time.  Psychiatric:        Mood and Affect: Mood normal.        Behavior: Behavior normal.        Thought Content: Thought content normal.       Assessment & Plan:     ICD-10-CM   1. Panniculitis  M79.3       Pictures were obtained of the patient and placed in the chart with the patient's or guardian's permission.  Recommend panniculectomy.  We will need HgA1C and PT notes.

## 2021-11-22 ENCOUNTER — Encounter: Payer: Self-pay | Admitting: Plastic Surgery

## 2021-11-28 ENCOUNTER — Other Ambulatory Visit: Payer: Self-pay

## 2021-11-28 ENCOUNTER — Telehealth: Payer: Self-pay | Admitting: Plastic Surgery

## 2021-11-28 ENCOUNTER — Ambulatory Visit
Admission: RE | Admit: 2021-11-28 | Discharge: 2021-11-28 | Disposition: A | Discharge status: Home / Self Care | Payer: No Typology Code available for payment source | Source: Ambulatory Visit | Attending: Obstetrics and Gynecology | Admitting: Obstetrics and Gynecology

## 2021-11-28 DIAGNOSIS — Z01419 Encounter for gynecological examination (general) (routine) without abnormal findings: Secondary | ICD-10-CM

## 2021-11-28 NOTE — Telephone Encounter (Signed)
LVM returning patient's call regarding muscle repair during surgery. Requested a callback to discuss the type of muscle she wants to add to her authorization. Also mentioned that we would need last A1C result and Physical therapy notes for the authorization.

## 2021-12-07 ENCOUNTER — Encounter: Payer: Self-pay | Admitting: Obstetrics and Gynecology

## 2021-12-07 ENCOUNTER — Other Ambulatory Visit: Payer: Self-pay

## 2021-12-07 ENCOUNTER — Other Ambulatory Visit (HOSPITAL_COMMUNITY)
Admission: RE | Admit: 2021-12-07 | Discharge: 2021-12-07 | Disposition: A | Discharge status: Home / Self Care | Payer: No Typology Code available for payment source | Source: Ambulatory Visit | Attending: Obstetrics and Gynecology | Admitting: Obstetrics and Gynecology

## 2021-12-07 ENCOUNTER — Ambulatory Visit (INDEPENDENT_AMBULATORY_CARE_PROVIDER_SITE_OTHER): Payer: No Typology Code available for payment source | Admitting: Obstetrics and Gynecology

## 2021-12-07 VITALS — BP 131/86 | HR 73 | Ht 64.0 in | Wt 193.0 lb

## 2021-12-07 DIAGNOSIS — Z01419 Encounter for gynecological examination (general) (routine) without abnormal findings: Secondary | ICD-10-CM | POA: Diagnosis present

## 2021-12-07 DIAGNOSIS — Z30432 Encounter for removal of intrauterine contraceptive device: Secondary | ICD-10-CM

## 2021-12-07 NOTE — Progress Notes (Signed)
Subjective:     Ashley Summers is a 41 y.o. female P3 with BMI 33 and LMP 11/07/21 who is here for a comprehensive physical exam. The patient reports no problems. Patient is not sexually active. She reports amenorrhea or a light period with levonorgestrel IUD. Patient desires IUD removal. Patient denies pelvic pain or abnormal discharge. Patient denies urinary symptoms or incontinence. Patient is without any complaints.   Past Medical History:  Diagnosis Date   Allergy    Anxiety    Essential hypertension 09/09/2020   Hypertension    Palpitations 09/09/2020   Past Surgical History:  Procedure Laterality Date   BREAST SURGERY     Reduction    Hammertoe correction     Family History  Problem Relation Age of Onset   Hypertension Mother    Stroke Mother    Pulmonary embolism Mother    Hypertension Brother    Hyperlipidemia Maternal Grandmother    Hypertension Maternal Grandmother     Social History   Socioeconomic History   Marital status: Single    Spouse name: Not on file   Number of children: Not on file   Years of education: Not on file   Highest education level: Not on file  Occupational History   Not on file  Tobacco Use   Smoking status: Never   Smokeless tobacco: Never  Vaping Use   Vaping Use: Never used  Substance and Sexual Activity   Alcohol use: Yes    Alcohol/week: 0.0 standard drinks    Comment: occasional   Drug use: No   Sexual activity: Yes    Partners: Male    Birth control/protection: I.U.D.  Other Topics Concern   Not on file  Social History Narrative   Work or School: Chartered certified accountant, [employer], [employer]      Home Situation: lives with 3 kids (17,15, 9)      Spiritual Beliefs: none      Lifestyle: diet is so so; has been trying to exercising            Social Determinants of Health   Financial Resource Strain: Not on file  Food Insecurity: Not on file  Transportation Needs: Not on file  Physical Activity: Not on file  Stress: Not  on file  Social Connections: Not on file  Intimate Partner Violence: Not on file   Health Maintenance  Topic Date Due   Pneumococcal Vaccine 29-18 Years old (1 - PCV) Never done   COVID-19 Vaccine (3 - Pfizer risk series) 04/29/2020   MAMMOGRAM  11/28/2022   PAP SMEAR-Modifier  11/04/2023   TETANUS/TDAP  11/08/2031   INFLUENZA VACCINE  Completed   Hepatitis C Screening  Completed   HIV Screening  Completed   HPV VACCINES  Aged Out       Review of Systems Pertinent items noted in HPI and remainder of comprehensive ROS otherwise negative.   Objective:  Blood pressure 131/86, pulse 73, height 5\' 4"  (1.626 m), weight 193 lb (87.5 kg), last menstrual period 11/07/2021.    GENERAL: Well-developed, well-nourished female in no acute distress.  HEENT: Normocephalic, atraumatic. Sclerae anicteric.  NECK: Supple. Normal thyroid.  LUNGS: Clear to auscultation bilaterally.  HEART: Regular rate and rhythm. BREASTS: Symmetric in size. No palpable masses or lymphadenopathy, skin changes, or nipple drainage. ABDOMEN: Soft, nontender, nondistended. No organomegaly. PELVIC: Normal external female genitalia. Vagina is pink and rugated.  Normal discharge. Normal appearing cervix with IUD strings extending from the os. Uterus is  14-weeks in size. No adnexal mass or tenderness. Chaperone present during the pelvic exam EXTREMITIES: No cyanosis, clubbing, or edema, 2+ distal pulses.    Assessment:    Healthy female exam.      Plan:    Patient with normal mammogram 11/2021 Pap smear collected STI screening per patient request Patient will be contacted with abnormal results  [place] NOTE IUD Removal  Patient identified, informed consent performed, consent signed.  Patient was in the dorsal lithotomy position, normal external genitalia was noted.  A speculum was placed in the patient's vagina, normal discharge was noted, no lesions. The cervix was visualized, no lesions, no  abnormal discharge.  The strings of the IUD were grasped and pulled using ring forceps. The IUD was removed in its entirety.  Patient tolerated the procedure well.    Patient is considering birth control pills if needed in the future   See After Visit Summary for Counseling Recommendations

## 2021-12-07 NOTE — Progress Notes (Signed)
Pt here for annual. Pt would like IUD removed and start on Pill.

## 2021-12-08 LAB — HEPATITIS C ANTIBODY: Hep C Virus Ab: <0.1 s/co ratio (ref 0.0–0.9)

## 2021-12-08 LAB — HIV ANTIBODY (ROUTINE TESTING W REFLEX): HIV Screen 4th Generation wRfx: NONREACTIVE

## 2021-12-08 LAB — CERVICOVAGINAL ANCILLARY ONLY
Chlamydia: NEGATIVE
Comment: NEGATIVE
Comment: NORMAL
Neisseria Gonorrhea: NEGATIVE

## 2021-12-08 LAB — RPR: RPR Ser Ql: NONREACTIVE

## 2021-12-08 LAB — HEPATITIS B SURFACE ANTIGEN: Hepatitis B Surface Ag: NEGATIVE

## 2021-12-11 LAB — CYTOLOGY - PAP
Comment: NEGATIVE
Diagnosis: NEGATIVE
High risk HPV: NEGATIVE

## 2021-12-22 ENCOUNTER — Other Ambulatory Visit (HOSPITAL_COMMUNITY): Payer: Self-pay

## 2021-12-22 ENCOUNTER — Other Ambulatory Visit: Payer: Self-pay | Admitting: Nurse Practitioner

## 2021-12-25 ENCOUNTER — Other Ambulatory Visit: Payer: Self-pay | Admitting: Nurse Practitioner

## 2021-12-25 ENCOUNTER — Other Ambulatory Visit (HOSPITAL_COMMUNITY): Payer: Self-pay

## 2021-12-26 ENCOUNTER — Other Ambulatory Visit (HOSPITAL_COMMUNITY): Payer: Self-pay

## 2021-12-26 MED ORDER — SAXENDA 18 MG/3ML ~~LOC~~ SOPN
3.0000 mg | PEN_INJECTOR | Freq: Every day | SUBCUTANEOUS | 3 refills | Status: DC
  Filled 2021-12-26: qty 15, 30d supply, fill #0

## 2021-12-27 ENCOUNTER — Other Ambulatory Visit (HOSPITAL_COMMUNITY): Payer: Self-pay

## 2021-12-28 ENCOUNTER — Other Ambulatory Visit (HOSPITAL_COMMUNITY): Payer: Self-pay

## 2022-01-16 ENCOUNTER — Other Ambulatory Visit (HOSPITAL_COMMUNITY): Payer: Self-pay

## 2022-01-16 MED FILL — Nystatin Cream 100000 Unit/GM: CUTANEOUS | 15 days supply | Qty: 30 | Fill #0 | Status: AC

## 2022-01-26 ENCOUNTER — Other Ambulatory Visit (HOSPITAL_COMMUNITY): Payer: Self-pay

## 2022-01-26 ENCOUNTER — Other Ambulatory Visit: Payer: Self-pay | Admitting: Obstetrics and Gynecology

## 2022-01-26 ENCOUNTER — Encounter: Payer: Self-pay | Admitting: Obstetrics and Gynecology

## 2022-01-26 DIAGNOSIS — N852 Hypertrophy of uterus: Secondary | ICD-10-CM

## 2022-01-26 MED ORDER — SLYND 4 MG PO TABS
1.0000 | ORAL_TABLET | Freq: Every day | ORAL | 4 refills | Status: DC
  Filled 2022-01-26 – 2022-06-27 (×3): qty 84, 84d supply, fill #0
  Filled 2022-09-19: qty 84, 84d supply, fill #1
  Filled 2022-12-08: qty 84, 84d supply, fill #2

## 2022-01-30 ENCOUNTER — Other Ambulatory Visit (HOSPITAL_COMMUNITY): Payer: Self-pay

## 2022-01-30 ENCOUNTER — Ambulatory Visit
Admission: RE | Admit: 2022-01-30 | Discharge: 2022-01-30 | Disposition: A | Discharge status: Home / Self Care | Payer: No Typology Code available for payment source | Source: Ambulatory Visit | Attending: Obstetrics and Gynecology | Admitting: Obstetrics and Gynecology

## 2022-01-30 ENCOUNTER — Other Ambulatory Visit: Payer: Self-pay

## 2022-01-30 DIAGNOSIS — N852 Hypertrophy of uterus: Secondary | ICD-10-CM | POA: Diagnosis not present

## 2022-01-31 ENCOUNTER — Other Ambulatory Visit (HOSPITAL_COMMUNITY): Payer: Self-pay

## 2022-02-06 ENCOUNTER — Encounter: Payer: Self-pay | Admitting: *Deleted

## 2022-02-06 ENCOUNTER — Telehealth: Payer: Self-pay | Admitting: *Deleted

## 2022-02-06 NOTE — Telephone Encounter (Signed)
TC from pt reporting SLYND not covered by insurance. SLYND coupon information given. Pt to check with pharmacy about cost with coupon and will let us know if she decides she would like a different RX. ?

## 2022-02-08 ENCOUNTER — Other Ambulatory Visit (HOSPITAL_COMMUNITY): Payer: Self-pay

## 2022-03-15 ENCOUNTER — Other Ambulatory Visit (HOSPITAL_COMMUNITY): Payer: Self-pay

## 2022-05-02 ENCOUNTER — Other Ambulatory Visit (HOSPITAL_COMMUNITY): Payer: Self-pay

## 2022-05-21 ENCOUNTER — Ambulatory Visit (INDEPENDENT_AMBULATORY_CARE_PROVIDER_SITE_OTHER): Payer: No Typology Code available for payment source | Admitting: Family Medicine

## 2022-05-21 VITALS — BP 122/80 | HR 81 | Temp 98.7°F | Wt 188.6 lb

## 2022-05-21 DIAGNOSIS — Z7689 Persons encountering health services in other specified circumstances: Secondary | ICD-10-CM | POA: Diagnosis not present

## 2022-05-21 DIAGNOSIS — I10 Essential (primary) hypertension: Secondary | ICD-10-CM | POA: Diagnosis not present

## 2022-05-21 DIAGNOSIS — F411 Generalized anxiety disorder: Secondary | ICD-10-CM | POA: Diagnosis not present

## 2022-05-21 DIAGNOSIS — F410 Panic disorder [episodic paroxysmal anxiety] without agoraphobia: Secondary | ICD-10-CM

## 2022-05-21 NOTE — Patient Instructions (Signed)
Behavioral Health Services: -to make an appointment contact the office/provider you are interested in seeing.  No referral is needed.  The below is not an all inclusive list, but will help you get started.  [email or web address] -counseling located off of [address]  [email or web address] -website helps you find providers in your area  [facility] -Located off of [address] across from [employer]  Dr. Akintayo is a Psychiatrist with [place]. [phone]  [facility]  [facility]  -[address]  [phone] -a place in town that has counseling and Psychiatry services.    

## 2022-05-21 NOTE — Progress Notes (Signed)
Patient presents to clinic today to f/u on ongoing issues and establish care.  SUBJECTIVE: PMH: Pt is a 41 yo female previously seen at [facility].  HTN: -Currently taking Norvasc 2.5 mg and HCTZ 25 mg daily -States checks BP at work.  Typically 120s/highest 88 -Patient states she was exercising at the gym, but has not recently due to elective surgery 2 wks ago (tummy tuck in [place], [place]).  Anxiety/panic attacks: -Patient endorsing for anxiety times years but recently increasing. -States was put on medication 2 months ago.  Tried hydroxyzine 10 mg.  States would not have medication on her at times of increased anxiety/panic. -Will try deep breathing and counting with panic attack symptoms started. -Patient endorses heart racing feeling closed in when riding in cars.  Previously only occurred when riding in backseat. -Can also have panic attack symptoms during the day or at work -Tried counseling 2 years ago  Past surgical history: -Tummy tuck and weight 2 weeks ago -breast reduction 15-16 yrs ago  Allergies: Penicillin-rash  Family medical history: Mom-hypertension, HLD CVA x2 Brother-HTN, HLD Maternal aunt-DM  Health Maintenance: Dental -- Vision -- Immunizations -- Colonoscopy -- Mammogram -- PAP --  Bone Density --    Past Medical History:  Diagnosis Date   Allergy    Anxiety    Essential hypertension 09/09/2020   Hypertension    Palpitations 09/09/2020    Past Surgical History:  Procedure Laterality Date   BREAST SURGERY     Reduction    Hammertoe correction      Current Outpatient Medications on File Prior to Visit  Medication Sig Dispense Refill   amLODipine (NORVASC) 2.5 MG tablet Take 1 tablet (2.5 mg total) by mouth daily. 90 tablet 1   cetirizine (ZYRTEC) 10 MG tablet Take by mouth.     cholecalciferol (VITAMIN D3) 25 MCG (1000 UNIT) tablet Take 2,000 Units by mouth daily.     Drospirenone (SLYND) 4 MG TABS Take 1 tablet by  mouth daily. (Patient not taking: Reported on 05/21/2022) 84 tablet 4   hydrochlorothiazide (HYDRODIURIL) 25 MG tablet Take 1 tablet (25 mg total) by mouth daily. 90 tablet 1   hydrOXYzine (ATARAX) 10 MG tablet Take 1 tablet (10 mg total) by mouth every 6 (six) hours as needed for anxiety. 60 tablet 2   Multiple Vitamin (MULTIVITAMIN) tablet Take 1 tablet by mouth daily.     nystatin cream (MYCOSTATIN) Apply 1 application topically 2 (two) times daily under breast 30 g 3   Insulin Pen Needle 31G X 5 MM MISC Use as directed with Saxenda 100 each 1   levonorgestrel (MIRENA) 20 MCG/24HR IUD 1 each by Intrauterine route once.     Liraglutide -Weight Management (SAXENDA) 18 MG/3ML SOPN Inject 3 mg into the skin daily at 6 (six) AM. 9 mL 3   No current facility-administered medications on file prior to visit.    Allergies  Allergen Reactions   Penicillins Rash    Family History  Problem Relation Age of Onset   Hypertension Mother    Stroke Mother    Pulmonary embolism Mother    Hypertension Brother    Hyperlipidemia Maternal Grandmother    Hypertension Maternal Grandmother     Social History   Socioeconomic History   Marital status: Single    Spouse name: Not on file   Number of children: Not on file   Years of education: Not on file   Highest education level: Not on file  Occupational History   Not on file  Tobacco Use   Smoking status: Never   Smokeless tobacco: Never  Vaping Use   Vaping Use: Never used  Substance and Sexual Activity   Alcohol use: Yes    Alcohol/week: 0.0 standard drinks of alcohol    Comment: occasional   Drug use: No   Sexual activity: Yes    Partners: Male    Birth control/protection: I.U.D.  Other Topics Concern   Not on file  Social History Narrative   Work or School: Chartered certified accountant, [employer], [employer] unit      Home Situation: lives with 3 kids (17,15, 9)      Spiritual Beliefs: none      Lifestyle: diet is so so; has been trying to exercising             Social Determinants of Health   Financial Resource Strain: Not on file  Food Insecurity: Not on file  Transportation Needs: Not on file  Physical Activity: Not on file  Stress: Not on file  Social Connections: Not on file  Intimate Partner Violence: Not on file    ROS General: Denies fever, chills, night sweats, changes in weight, changes in appetite HEENT: Denies headaches, ear pain, changes in vision, rhinorrhea, sore throat CV: Denies CP, palpitations, SOB, orthopnea Pulm: Denies SOB, cough, wheezing GI: Denies abdominal pain, nausea, vomiting, diarrhea, constipation GU: Denies dysuria, hematuria, frequency, vaginal discharge Msk: Denies muscle cramps, joint pains Neuro: Denies weakness, numbness, tingling Skin: Denies rashes, bruising Psych: Denies depression, hallucinations +anxiety   BP 122/80 (BP Location: Left Arm, Patient Position: Sitting, Cuff Size: Normal)   Pulse 81   Temp 98.7 F (37.1 C) (Oral)   Wt 188 lb 9.6 oz (85.5 kg)   SpO2 97%   BMI 32.37 kg/m   Physical Exam Gen. Pleasant, well developed, well-nourished, in NAD HEENT - [place]/AT, PERRL, EOMI, conjunctive clear, no scleral icterus, no nasal drainage Lungs: no use of accessory muscles, CTAB, no wheezes, rales or rhonchi Cardiovascular: RRR,  No r/g/m, no peripheral edema Abdomen: BS present, soft, nontender,nondistended Musculoskeletal: No deformities, moves all four extremities, no cyanosis or clubbing, normal tone Neuro:  A&Ox3, CN II-XII intact, normal gait Skin:  Warm, dry, intact, no lesions     05/21/2022   10:54 AM 08/05/2020    1:56 PM 06/17/2020    1:51 PM 11/18/2019    9:18 AM  GAD 7 : Generalized Anxiety Score  Nervous, Anxious, on Edge '2 2 2 '$ 0  Control/stop worrying '3 2 2 '$ 0  Worry too much - different things '3 2 2 '$ 0  Trouble relaxing 1 1 0 1  Restless 1 0 0 0  Easily annoyed or irritable 3 0 3 0  Afraid - awful might happen 3 2 0 0  Total GAD 7 Score '16 9 9 1  '$ Anxiety  Difficulty Not difficult at all Not difficult at all         05/21/2022   10:18 AM 10/12/2021   12:06 PM 08/05/2020    1:56 PM  Depression screen PHQ 2/9  Decreased Interest 0 0 0  Down, Depressed, Hopeless 0 0 1  PHQ - 2 Score 0 0 1  Altered sleeping 0  3  Tired, decreased energy 0  0  Change in appetite 0  0  Feeling bad or failure about yourself  0  0  Trouble concentrating 2  1  Moving slowly or fidgety/restless 0  0  Suicidal thoughts 0  0  PHQ-9 Score 2  5  Difficult doing work/chores Not difficult at all  Not difficult at all    No results found for this or any previous visit (from the past 2160 hour(s)).  Assessment/Plan: Essential hypertension -controlled -continue current medications including Norvasc 2.5 mg and HCTZ 25 mg daiy -Lifestyle modifications -Continue to monitor BP  Panic attacks -Patient encouraged to keep hydroxyzine with her during the day to take if needed -Given increase in symptoms discussed scheduling appointment with psychiatry. -Patient given [facility] contact information. -Continue to monitor  GAD (generalized anxiety disorder) -GAD-7 score 16 this visit -Given increased panic attack symptoms discussed revisiting counseling and medication options.  Patient hesitant but willing to consider -Given info to make appointment with psychiatry -Continue hydroxyzine as needed.  Encouraged to keep medication with her in case she needs it. -Given precautions -Continue to monitor  Encounter to establish care -We reviewed the PMH, PSH, FH, SH, Meds and Allergies. -We provided refills for any medications we will prescribe as needed. -We addressed current concerns per orders and patient instructions. -We have asked for records for pertinent exams, studies, vaccines and notes from previous providers. -We have advised patient to follow up per instructions below.  F/u prn  Grier Mitts, MD

## 2022-06-07 ENCOUNTER — Encounter: Payer: Self-pay | Admitting: Family Medicine

## 2022-06-07 ENCOUNTER — Other Ambulatory Visit (HOSPITAL_COMMUNITY): Payer: Self-pay

## 2022-06-07 DIAGNOSIS — I10 Essential (primary) hypertension: Secondary | ICD-10-CM

## 2022-06-07 MED ORDER — AMLODIPINE BESYLATE 2.5 MG PO TABS
2.5000 mg | ORAL_TABLET | Freq: Every day | ORAL | 1 refills | Status: DC
  Filled 2022-06-07 – 2022-06-27 (×3): qty 90, 90d supply, fill #0
  Filled 2022-08-15 – 2022-09-05 (×2): qty 90, 90d supply, fill #1

## 2022-06-07 MED ORDER — HYDROCHLOROTHIAZIDE 25 MG PO TABS
25.0000 mg | ORAL_TABLET | Freq: Every day | ORAL | 1 refills | Status: DC
  Filled 2022-06-07 – 2022-08-15 (×3): qty 90, 90d supply, fill #0

## 2022-06-09 ENCOUNTER — Encounter: Payer: Self-pay | Admitting: Family Medicine

## 2022-06-15 ENCOUNTER — Other Ambulatory Visit (HOSPITAL_COMMUNITY): Payer: Self-pay

## 2022-06-19 ENCOUNTER — Other Ambulatory Visit (HOSPITAL_COMMUNITY): Payer: Self-pay

## 2022-06-27 ENCOUNTER — Ambulatory Visit: Payer: No Typology Code available for payment source | Admitting: Family Medicine

## 2022-06-27 ENCOUNTER — Other Ambulatory Visit (HOSPITAL_COMMUNITY): Payer: Self-pay

## 2022-07-04 ENCOUNTER — Ambulatory Visit (INDEPENDENT_AMBULATORY_CARE_PROVIDER_SITE_OTHER): Payer: No Typology Code available for payment source | Admitting: Family Medicine

## 2022-07-04 VITALS — BP 120/84 | HR 60 | Temp 98.3°F | Wt 182.6 lb

## 2022-07-04 DIAGNOSIS — J302 Other seasonal allergic rhinitis: Secondary | ICD-10-CM | POA: Diagnosis not present

## 2022-07-04 DIAGNOSIS — I10 Essential (primary) hypertension: Secondary | ICD-10-CM

## 2022-07-04 DIAGNOSIS — H9313 Tinnitus, bilateral: Secondary | ICD-10-CM | POA: Diagnosis not present

## 2022-07-04 NOTE — Progress Notes (Signed)
Subjective:    Patient ID: Ashley Summers, female    DOB: November 05, 1981, 41 y.o.   MRN: [id number]  Chief Complaint  Patient presents with   Follow-up    With anxiety. Reports has not make any schedule with BH. Reports went to see a therapist in the past, did not work.    Hypertension   Dizziness    Today at work. 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 Patient reports lightheaded, ear ringing and "swimming head"    HPI Patient was seen today for acute concern.  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  Pt states she held on to the wall for a few secs and the sensation passed.  Pt ate a piece of sausage this am with her meds and had some water.  Pt denies any new OTC meds/supplements.  Took Zyrtec yesterday and today for allergy symptoms including rhinorrhea.  Pt states ears felt clogged and may have had a mild pressure sensation around nose.  Pt also notes wearing new glasses.  Pt inquires if she can stop one of her bp meds.  Taking norvasc 2.5 mg and HCTZ 25 mg dialy.  Pt exercising which helps with anxiety and increasing water intake.  BP at work has been 1teens-120s/70s-80s.  Denies HAs.  Past Medical History:  Diagnosis Date   Allergy    Anxiety    Essential hypertension 09/09/2020   Hypertension    Palpitations 09/09/2020    Allergies  Allergen Reactions   Penicillins Rash    ROS General: Denies fever, chills, night sweats, changes in weight, changes in appetite HEENT: Denies headaches, ear pain, changes in vision, sore throat +tinnitus, ears clogged, rhinorrhea, mild pressure around nose CV: Denies CP, palpitations, SOB, orthopnea Pulm: Denies SOB, cough, wheezing GI: Denies abdominal pain, nausea, vomiting, diarrhea, constipation GU: Denies dysuria, hematuria, frequency, vaginal discharge Msk: Denies muscle cramps, joint pains Neuro: Denies weakness, numbness, tingling Skin: Denies rashes, bruising Psych: Denies  depression, anxiety, hallucinations     Objective:    Blood pressure 120/84, pulse 60, temperature 98.3 F (36.8 C), temperature source Oral, weight 182 lb 9.6 oz (82.8 kg), SpO2 99 %.  Gen. Pleasant, well-nourished, in no distress, normal affect   HEENT: [place]/AT, face symmetric, conjunctiva clear, no scleral icterus, PERRLA, EOMI, nares patent with edema and mild erythema L nares more narrow than R, no drainage, pharynx without erythema or exudate.  TMs full b/l. Neck: No JVD, no thyromegaly, no carotid bruits Lungs: no accessory muscle use, CTAB, no wheezes or rales Cardiovascular: RRR, no m/r/g, no peripheral edema Neuro:  A&Ox3, CN II-XII intact, normal gait Skin:  Warm, no lesions/ rash   Wt Readings from Last 3 Encounters:  07/04/22 182 lb 9.6 oz (82.8 kg)  05/21/22 188 lb 9.6 oz (85.5 kg)  12/07/21 193 lb (87.5 kg)    Lab Results  Component Value Date   WBC 5.3 11/07/2021   HGB 13.9 11/07/2021   HCT 41.0 11/07/2021   PLT 201 11/07/2021   GLUCOSE 80 11/07/2021   CHOL 198 11/07/2021   TRIG 100 11/07/2021   HDL 62 11/07/2021   LDLCALC 118 (H) 11/07/2021   ALT 19 11/07/2021   AST 18 11/07/2021   NA 139 11/07/2021   K 3.5 11/07/2021   CL 98 11/07/2021   CREATININE 0.88 11/07/2021   BUN 15 11/07/2021   CO2 26 11/07/2021   TSH 1.420 09/09/2020   HGBA1C 5.2 11/07/2021  MICROALBUR 10 11/07/2021    Assessment/Plan:  Seasonal allergies -Likely causing tinnitus and clogged sensation in ears as well as mild pressure developing in place -Discussed taking OTC antihistamine such as Zyrtec consistently for the next few days. -Also discussed using Flonase or saline nasal rinse to help with nasal edema -Local honey can also be helpful for allergies.  Tinnitus of both ears -Likely 2/2 seasonal allergy symptoms.  Less likely 2/2 HTN as bp controlled. -Discussed taking OTC antihistamine consistently for the next few days to see if symptoms improve -For continued symptoms  obtain labs including CMP, TSH, CBC, lipid panel  Essential hypertension -controlled -will have pt hold Norvasc 2.5 mg daily.  Continue HCTZ 25 mg daily -pt to check bp daily.  For consistent elevation >140/90 restart Norvasc 2.5 mg -Continue lifestyle modifications  F/u as needed in the next few weeks for continued or worsening symptoms.  Grier Mitts, MD

## 2022-08-02 ENCOUNTER — Ambulatory Visit (INDEPENDENT_AMBULATORY_CARE_PROVIDER_SITE_OTHER): Payer: No Typology Code available for payment source | Admitting: Family

## 2022-08-02 ENCOUNTER — Encounter: Payer: Self-pay | Admitting: Family

## 2022-08-02 VITALS — BP 130/87 | HR 91 | Temp 98.3°F | Ht 64.0 in | Wt 219.0 lb

## 2022-08-02 DIAGNOSIS — R059 Cough, unspecified: Secondary | ICD-10-CM

## 2022-08-02 DIAGNOSIS — U071 COVID-19: Secondary | ICD-10-CM

## 2022-08-02 DIAGNOSIS — J029 Acute pharyngitis, unspecified: Secondary | ICD-10-CM

## 2022-08-02 LAB — POCT RAPID STREP A (OFFICE): Rapid Strep A Screen: NEGATIVE

## 2022-08-02 LAB — POCT INFLUENZA A/B
Influenza A, POC: NEGATIVE
Influenza B, POC: NEGATIVE

## 2022-08-02 LAB — POC COVID19 BINAXNOW: SARS Coronavirus 2 Ag: POSITIVE — AB

## 2022-08-02 MED ORDER — NIRMATRELVIR/RITONAVIR (PAXLOVID)TABLET: 3.0000 | ORAL_TABLET | Freq: Two times a day (BID) | ORAL | 0 refills | Status: DC

## 2022-08-02 MED ORDER — NIRMATRELVIR/RITONAVIR (PAXLOVID)TABLET: 3.0000 | ORAL_TABLET | Freq: Two times a day (BID) | ORAL | 0 refills | Status: AC

## 2022-08-02 NOTE — Patient Instructions (Addendum)
As discussed, I have sent over Paxlovid to your pharmacy. Use extra birth control protection or abstain from intercourse while taking this medication (5 day course) as it can make your birth control pills less effective. OK to continue taking OTC meds as needed for symptoms. Take up to '600mg'$  of generic Advil for sore throat pain, aches/pains, or fever. Drink at least 2 liters of water every day!  Call back to the office if symptoms are not resolving.         PLEASE NOTE:  If you had any lab tests please let us know if you have not heard back within a few days. You may see your results on MyChart before we have a chance to review them but we will give you a call once they are reviewed by [place]. If we ordered any referrals today, please let us know if you have not heard from their office within the next week.

## 2022-08-02 NOTE — Progress Notes (Signed)
Patient ID: Ashley Summers, female    DOB: 1980/12/21, 41 y.o.   MRN: [id number]  Chief Complaint  Patient presents with   Cough    Pt c/o wet cough(yellow/green mucus), body aches, fever of 102.7, chills for about 2 days. Has tried daytime, cold and flu which did help some. Sore throat for 2 days.     HPI: Upper Respiratory Infection: Symptoms include  fever, body aches, productive cough, chills, and sore throat .  Onset of symptoms was 2 days ago, gradually worsening since that time. She is drinking moderate amounts of fluids. Evaluation to date: none.  Treatment to date: OTC sinus/cold meds. 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   Assessment & Plan:  1. Cough, unspecified type  - POC COVID-19 - POCT Influenza A/B  2. Sore throat rapid strep neg - advised on taking up to '600mg'$  of generic Advil tid for pain & swelling. Warm salt water gargles tid, throat lozenges prn.  - POCT rapid strep A - POCT Influenza A/B  3. COVID-19 Sending Paxlovid, pt advised of FDA label for emergency use, how to take, & SE. Advised of CDC guidelines for masking if out in public. OK to continue taking OTC sinus or pain meds. Encouraged to monitor & notify office of any worsening symptoms: increased shortness of breath, weakness, and signs of dehydration. Instructed to rest and hydrate well.   - nirmatrelvir/ritonavir EUA (PAXLOVID) 20 x 150 MG & 10 x '100MG'$  TABS; Take 3 tablets by mouth 2 (two) times daily for 5 days. (Take nirmatrelvir 150 mg two tablets twice daily for 5 days and ritonavir 100 mg one tablet twice daily for 5 days)  Dispense: 30 tablet; Refill: 0   Subjective:    Outpatient Medications Prior to Visit  Medication Sig Dispense Refill   amLODipine (NORVASC) 2.5 MG tablet Take 1 tablet (2.5 mg total) by mouth daily. 90 tablet 1   cetirizine (ZYRTEC) 10 MG tablet Take by mouth.     cholecalciferol (VITAMIN D3) 25 MCG (1000 UNIT) tablet Take 2,000 Units by mouth  daily.     Drospirenone (SLYND) 4 MG TABS Take 1 tablet by mouth daily. 84 tablet 4   hydrochlorothiazide (HYDRODIURIL) 25 MG tablet Take 1 tablet (25 mg total) by mouth daily. 90 tablet 1   hydrOXYzine (ATARAX) 10 MG tablet Take 1 tablet (10 mg total) by mouth every 6 (six) hours as needed for anxiety. 60 tablet 2   Multiple Vitamin (MULTIVITAMIN) tablet Take 1 tablet by mouth daily.     nystatin cream (MYCOSTATIN) Apply 1 application topically 2 (two) times daily under breast (Patient taking differently: Apply 1 application  topically 2 (two) times daily. PRN) 30 g 3   No facility-administered medications prior to visit.   Past Medical History:  Diagnosis Date   Allergy    Anxiety    Essential hypertension 09/09/2020   Hypertension    Palpitations 09/09/2020   Past Surgical History:  Procedure Laterality Date   BREAST SURGERY     Reduction    Hammertoe correction     Allergies  Allergen Reactions   Penicillins Rash      Objective:    Physical Exam Vitals and nursing note reviewed.  Constitutional:      Appearance: Normal appearance.  Cardiovascular:     Rate and Rhythm: Normal rate and regular rhythm.  Pulmonary:     Effort: Pulmonary effort is normal.     Breath sounds: Normal breath  sounds.  Musculoskeletal:        General: Normal range of motion.  Skin:    General: Skin is warm and dry.  Neurological:     Mental Status: She is alert.  Psychiatric:        Mood and Affect: Mood normal.        Behavior: Behavior normal.    BP 130/87 (BP Location: Left Arm, Patient Position: Sitting, Cuff Size: Large)   Pulse 91   Temp 98.3 F (36.8 C) (Temporal)   Ht '5\' 4"'$  (1.626 m)   Wt 219 lb (99.3 kg)   LMP 07/17/2022 (Approximate)   SpO2 98%   BMI 37.59 kg/m  Wt Readings from Last 3 Encounters:  08/02/22 219 lb (99.3 kg)  07/04/22 182 lb 9.6 oz (82.8 kg)  05/21/22 188 lb 9.6 oz (85.5 kg)       Jeanie Sewer, NP

## 2022-08-02 NOTE — Addendum Note (Signed)
Addended byJeanie Sewer on: 08/02/2022 11:07 AM   Modules accepted: Orders

## 2022-08-15 ENCOUNTER — Other Ambulatory Visit (HOSPITAL_COMMUNITY): Payer: Self-pay

## 2022-08-16 ENCOUNTER — Other Ambulatory Visit (HOSPITAL_COMMUNITY): Payer: Self-pay

## 2022-09-05 ENCOUNTER — Other Ambulatory Visit (HOSPITAL_COMMUNITY): Payer: Self-pay

## 2022-09-07 ENCOUNTER — Other Ambulatory Visit (HOSPITAL_COMMUNITY): Payer: Self-pay

## 2022-09-20 ENCOUNTER — Other Ambulatory Visit (HOSPITAL_COMMUNITY): Payer: Self-pay

## 2022-11-08 ENCOUNTER — Other Ambulatory Visit (HOSPITAL_COMMUNITY): Payer: Self-pay

## 2022-11-08 ENCOUNTER — Encounter: Payer: Self-pay | Admitting: Family Medicine

## 2022-11-08 ENCOUNTER — Ambulatory Visit (INDEPENDENT_AMBULATORY_CARE_PROVIDER_SITE_OTHER): Payer: No Typology Code available for payment source | Admitting: Family Medicine

## 2022-11-08 VITALS — BP 120/80 | HR 58 | Temp 98.2°F | Ht 64.25 in | Wt 185.8 lb

## 2022-11-08 DIAGNOSIS — K5900 Constipation, unspecified: Secondary | ICD-10-CM | POA: Diagnosis not present

## 2022-11-08 DIAGNOSIS — E559 Vitamin D deficiency, unspecified: Secondary | ICD-10-CM

## 2022-11-08 DIAGNOSIS — I10 Essential (primary) hypertension: Secondary | ICD-10-CM

## 2022-11-08 DIAGNOSIS — Z0000 Encounter for general adult medical examination without abnormal findings: Secondary | ICD-10-CM

## 2022-11-08 DIAGNOSIS — E7841 Elevated Lipoprotein(a): Secondary | ICD-10-CM | POA: Diagnosis not present

## 2022-11-08 LAB — CBC WITH DIFFERENTIAL/PLATELET
Basophils Absolute: 0 10*3/uL (ref 0.0–0.1)
Basophils Relative: 0.6 % (ref 0.0–3.0)
Eosinophils Absolute: 0.2 10*3/uL (ref 0.0–0.7)
Eosinophils Relative: 4.4 % (ref 0.0–5.0)
HCT: 39.8 % (ref 36.0–46.0)
Hemoglobin: 13.6 g/dL (ref 12.0–15.0)
Lymphocytes Relative: 49.8 % — ABNORMAL HIGH (ref 12.0–46.0)
Lymphs Abs: 2.4 10*3/uL (ref 0.7–4.0)
MCHC: 34.1 g/dL (ref 30.0–36.0)
MCV: 92.7 fl (ref 78.0–100.0)
Monocytes Absolute: 0.4 10*3/uL (ref 0.1–1.0)
Monocytes Relative: 8.5 % (ref 3.0–12.0)
Neutro Abs: 1.8 10*3/uL (ref 1.4–7.7)
Neutrophils Relative %: 36.7 % — ABNORMAL LOW (ref 43.0–77.0)
Platelets: 196 10*3/uL (ref 150.0–400.0)
RBC: 4.29 Mil/uL (ref 3.87–5.11)
RDW: 13.6 % (ref 11.5–15.5)
WBC: 4.8 10*3/uL (ref 4.0–10.5)

## 2022-11-08 LAB — LIPID PANEL
Cholesterol: 184 mg/dL (ref 0–200)
HDL: 52.3 mg/dL (ref >39.00)
LDL Cholesterol: 109 mg/dL — ABNORMAL HIGH (ref 0–99)
NonHDL: 132.05
Total CHOL/HDL Ratio: 4
Triglycerides: 115 mg/dL (ref 0.0–149.0)
VLDL: 23 mg/dL (ref 0.0–40.0)

## 2022-11-08 LAB — T4, FREE: Free T4: 0.91 ng/dL (ref 0.60–1.60)

## 2022-11-08 LAB — COMPREHENSIVE METABOLIC PANEL
ALT: 21 U/L (ref 0–35)
AST: 17 U/L (ref 0–37)
Albumin: 4.3 g/dL (ref 3.5–5.2)
Alkaline Phosphatase: 43 U/L (ref 39–117)
BUN: 19 mg/dL (ref 6–23)
CO2: 29 mEq/L (ref 19–32)
Calcium: 9.2 mg/dL (ref 8.4–10.5)
Chloride: 102 mEq/L (ref 96–112)
Creatinine, Ser: 1.12 mg/dL (ref 0.40–1.20)
GFR: 60.12 mL/min (ref >60.00)
Glucose, Bld: 100 mg/dL — ABNORMAL HIGH (ref 70–99)
Potassium: 3.8 mEq/L (ref 3.5–5.1)
Sodium: 137 mEq/L (ref 135–145)
Total Bilirubin: 0.7 mg/dL (ref 0.2–1.2)
Total Protein: 7.2 g/dL (ref 6.0–8.3)

## 2022-11-08 LAB — HEMOGLOBIN A1C: Hgb A1c MFr Bld: 5.4 % (ref 4.6–6.5)

## 2022-11-08 LAB — VITAMIN D 25 HYDROXY (VIT D DEFICIENCY, FRACTURES): VITD: 49.61 ng/mL (ref 30.00–100.00)

## 2022-11-08 LAB — TSH: TSH: 1.22 u[IU]/mL (ref 0.35–5.50)

## 2022-11-08 MED ORDER — HYDROCHLOROTHIAZIDE 25 MG PO TABS
25.0000 mg | ORAL_TABLET | Freq: Every day | ORAL | 3 refills | Status: DC
  Filled 2022-11-08: qty 90, 90d supply, fill #0
  Filled 2023-02-08: qty 90, 90d supply, fill #1
  Filled 2023-05-08: qty 90, 90d supply, fill #2
  Filled 2023-08-28: qty 90, 90d supply, fill #3

## 2022-11-08 NOTE — Progress Notes (Signed)
Subjective:     Ashley Summers is a 41 y.o. female and is here for a comprehensive physical exam. The patient reports doing well.  Patient endorses intermittent constipation with bloating.  May have BM every 3 days.  Will take OTC medication to help with symptoms.  Patient working to increase fiber intake.  States BP controlled on hydrochlorothiazide 25 mg daily.  Has Rx for Norvasc 2.5 mg in case BP remained elevated but has not had to take it.  Patient taking Dospirenone (Slynd) 4 mg, states has not had menses since August.  Tried Xyzal at night for allergy symptoms, but medication is to pt to wake up in the middle of the night.  States sleep returned to normal after stopping medication at night.  Will take an late evening if he needs additional allergy medication.  Mammogram scheduled in a few weeks.  Last Pap 12/11/2021.  Social History   Socioeconomic History   Marital status: Single    Spouse name: Not on file   Number of children: Not on file   Years of education: Not on file   Highest education level: Associate degree: occupational, Hotel manager, or vocational program  Occupational History   Not on file  Tobacco Use   Smoking status: Never   Smokeless tobacco: Never  Vaping Use   Vaping Use: Never used  Substance and Sexual Activity   Alcohol use: Yes    Alcohol/week: 0.0 standard drinks of alcohol    Comment: occasional   Drug use: No   Sexual activity: Yes    Partners: Male    Birth control/protection: I.U.D.  Other Topics Concern   Not on file  Social History Narrative   Work or School: Chartered certified accountant, [employer], [employer] unit      Home Situation: lives with 3 kids (17,15, 9)      Spiritual Beliefs: none      Lifestyle: diet is so so; has been trying to exercising            Social Determinants of Health   Financial Resource Strain: Medium Risk (07/03/2022)   Overall Financial Resource Strain (CARDIA)    Difficulty of Paying Living Expenses: Somewhat hard  Food  Insecurity: Food Insecurity Present (07/03/2022)   Hunger Vital Sign    Worried About Running Out of Food in the Last Year: Sometimes true    Ran Out of Food in the Last Year: Never true  Transportation Needs: No Transportation Needs (07/03/2022)   PRAPARE - Hydrologist (Medical): No    Lack of Transportation (Non-Medical): No  Physical Activity: Insufficiently Active (07/03/2022)   Exercise Vital Sign    Days of Exercise per Week: 3 days    Minutes of Exercise per Session: 30 min  Stress: Stress Concern Present (07/03/2022)   [place]    Feeling of Stress : To some extent  Social Connections: Unknown (07/03/2022)   Social Connection and Isolation Panel [NHANES]    Frequency of Communication with Friends and Family: More than three times a week    Frequency of Social Gatherings with Friends and Family: Once a week    Attends Religious Services: Patient refused    Marine scientist or Organizations: No    Attends Music therapist: Not on file    Marital Status: Never married  Intimate Partner Violence: Not on file   Health Maintenance  Topic Date Due   COVID-19  Vaccine (3 - Pfizer risk series) 11/24/2022 (Originally 04/29/2020)   MAMMOGRAM  11/28/2022   PAP SMEAR-Modifier  12/07/2024   DTaP/Tdap/Td (3 - Td or Tdap) 11/08/2031   INFLUENZA VACCINE  Completed   Hepatitis C Screening  Completed   HIV Screening  Completed   HPV VACCINES  Aged Out    The following portions of the patient's history were reviewed and updated as appropriate: allergies, current medications, past family history, past medical history, past social history, past surgical history, and problem list.  Review of Systems Pertinent items noted in HPI and remainder of comprehensive ROS otherwise negative.   Objective:    BP 120/80 (BP Location: Right Arm, Patient Position: Sitting, Cuff Size: Normal)   Pulse  (!) 58   Temp 98.2 F (36.8 C) (Oral)   Ht 5' 4.25" (1.632 m)   Wt 185 lb 12.8 oz (84.3 kg)   LMP 07/23/2022 (Exact Date)   SpO2 100%   BMI 31.64 kg/m  General appearance: alert, cooperative, and no distress Head: Normocephalic, without obvious abnormality, atraumatic Eyes: conjunctivae/corneas clear. PERRL, EOM's intact. Fundi benign. Ears: normal TM's and external ear canals both ears Nose: Nares normal. Septum midline. Mucosa normal. No drainage or sinus tenderness. Throat: lips, mucosa, and tongue normal; teeth and gums normal Neck: no adenopathy, no carotid bruit, no JVD, supple, symmetrical, trachea midline, and thyroid not enlarged, symmetric, no tenderness/mass/nodules Lungs: clear to auscultation bilaterally Heart: regular rate and rhythm, S1, S2 normal, no murmur, click, rub or gallop Abdomen: soft, non-tender; bowel sounds normal; no masses,  no organomegaly Extremities: extremities normal, atraumatic, no cyanosis or edema Pulses: 2+ and symmetric Skin: Skin color, texture, turgor normal. No rashes or lesions Lymph nodes: Cervical, supraclavicular, and axillary nodes normal. Neurologic: Alert and oriented X 3, normal strength and tone. Normal symmetric reflexes. Normal coordination and gait    Assessment:    Healthy female exam.      Plan:    Anticipatory guidance given including wearing seatbelts, smoke detectors in the home, increasing physical activity, increasing p.o. intake of water and vegetables. -Labs -Mammogram done 11/28/2021.  Mammogram for this year scheduled in few weeks -Colonoscopy not indicated 2/2 age -Pap done 12/11/2021 with OB/GYN. -Next CPE in 1 year See After Visit Summary for Counseling Recommendations  Well adult exam - Plan: Hemoglobin A1c  Essential hypertension  -controlled -Continue hydrochlorothiazide 25 mg daily -Lifestyle modifications -Has Rx for Norvasc 2.5 mg to take in the event BP is consistently greater then 140/90.  Has not  required Norvasc. - Plan: CBC with Differential/Platelet, TSH, CMP, T4, Free, hydrochlorothiazide (HYDRODIURIL) 25 MG tablet  Constipation, unspecified constipation type -Discussed increasing fiber in diet -Continue walking and drinking water throughout the day -MiraLAX daily as needed - Plan: CBC with Differential/Platelet, TSH, Hemoglobin A1c, CMP, T4, Free  Vitamin D deficiency - Plan: Vitamin D, 25-hydroxy  Elevated lipoprotein(a) -Total cholesterol 198, HDL 62, LDL 118, triglycerides 100 on 11/07/2021 -Lifestyle modifications - Plan: Lipid panel  F/u in 4-6 months for HTN, sooner if needed  Grier Mitts, MD

## 2022-11-21 ENCOUNTER — Other Ambulatory Visit: Payer: Self-pay | Admitting: Obstetrics and Gynecology

## 2022-11-21 DIAGNOSIS — Z1231 Encounter for screening mammogram for malignant neoplasm of breast: Secondary | ICD-10-CM

## 2022-11-30 ENCOUNTER — Ambulatory Visit
Admission: RE | Admit: 2022-11-30 | Discharge: 2022-11-30 | Disposition: A | Discharge status: Home / Self Care | Payer: No Typology Code available for payment source | Source: Ambulatory Visit | Attending: Obstetrics and Gynecology | Admitting: Obstetrics and Gynecology

## 2022-11-30 DIAGNOSIS — Z1231 Encounter for screening mammogram for malignant neoplasm of breast: Secondary | ICD-10-CM

## 2022-12-08 ENCOUNTER — Other Ambulatory Visit: Payer: Self-pay | Admitting: Family Medicine

## 2022-12-08 DIAGNOSIS — I10 Essential (primary) hypertension: Secondary | ICD-10-CM

## 2022-12-10 ENCOUNTER — Other Ambulatory Visit (HOSPITAL_COMMUNITY): Payer: Self-pay

## 2022-12-10 ENCOUNTER — Other Ambulatory Visit: Payer: Self-pay

## 2022-12-10 MED ORDER — AMLODIPINE BESYLATE 2.5 MG PO TABS
2.5000 mg | ORAL_TABLET | Freq: Every day | ORAL | 1 refills | Status: DC
  Filled 2022-12-10: qty 90, 90d supply, fill #0
  Filled 2023-03-28: qty 90, 90d supply, fill #1

## 2023-01-16 DIAGNOSIS — Z01419 Encounter for gynecological examination (general) (routine) without abnormal findings: Secondary | ICD-10-CM | POA: Diagnosis not present

## 2023-01-16 DIAGNOSIS — Z01411 Encounter for gynecological examination (general) (routine) with abnormal findings: Secondary | ICD-10-CM | POA: Diagnosis not present

## 2023-01-16 DIAGNOSIS — Z113 Encounter for screening for infections with a predominantly sexual mode of transmission: Secondary | ICD-10-CM | POA: Diagnosis not present

## 2023-01-16 DIAGNOSIS — Z124 Encounter for screening for malignant neoplasm of cervix: Secondary | ICD-10-CM | POA: Diagnosis not present

## 2023-01-22 ENCOUNTER — Ambulatory Visit: Payer: 59 | Admitting: Obstetrics and Gynecology

## 2023-01-23 ENCOUNTER — Ambulatory Visit: Payer: 59

## 2023-02-08 ENCOUNTER — Other Ambulatory Visit: Payer: Self-pay

## 2023-02-08 ENCOUNTER — Other Ambulatory Visit: Payer: Self-pay | Admitting: Obstetrics and Gynecology

## 2023-02-12 ENCOUNTER — Other Ambulatory Visit (HOSPITAL_COMMUNITY): Payer: Self-pay

## 2023-02-12 MED ORDER — SLYND 4 MG PO TABS
1.0000 | ORAL_TABLET | Freq: Every day | ORAL | 11 refills | Status: DC
  Filled 2023-02-12: qty 28, 28d supply, fill #0
  Filled 2023-02-13: qty 84, 84d supply, fill #0
  Filled 2023-02-14: qty 28, 28d supply, fill #0

## 2023-02-13 ENCOUNTER — Other Ambulatory Visit (HOSPITAL_COMMUNITY): Payer: Self-pay

## 2023-02-14 ENCOUNTER — Other Ambulatory Visit (HOSPITAL_COMMUNITY): Payer: Self-pay

## 2023-02-28 DIAGNOSIS — B009 Herpesviral infection, unspecified: Secondary | ICD-10-CM | POA: Diagnosis not present

## 2023-03-27 ENCOUNTER — Ambulatory Visit (INDEPENDENT_AMBULATORY_CARE_PROVIDER_SITE_OTHER): Payer: 59 | Admitting: Nurse Practitioner

## 2023-03-27 ENCOUNTER — Encounter: Payer: Self-pay | Admitting: Nurse Practitioner

## 2023-03-27 ENCOUNTER — Other Ambulatory Visit (HOSPITAL_COMMUNITY): Payer: Self-pay

## 2023-03-27 VITALS — BP 118/80 | HR 73 | Resp 20 | Ht 63.58 in | Wt 191.2 lb

## 2023-03-27 DIAGNOSIS — R1030 Lower abdominal pain, unspecified: Secondary | ICD-10-CM

## 2023-03-27 DIAGNOSIS — Z01419 Encounter for gynecological examination (general) (routine) without abnormal findings: Secondary | ICD-10-CM | POA: Diagnosis not present

## 2023-03-27 DIAGNOSIS — Z3041 Encounter for surveillance of contraceptive pills: Secondary | ICD-10-CM

## 2023-03-27 DIAGNOSIS — K5900 Constipation, unspecified: Secondary | ICD-10-CM

## 2023-03-27 MED ORDER — SLYND 4 MG PO TABS
1.0000 | ORAL_TABLET | Freq: Every day | ORAL | 3 refills | Status: DC
  Filled 2023-03-27: qty 84, 84d supply, fill #0
  Filled 2023-06-22: qty 84, 84d supply, fill #1
  Filled 2023-08-28: qty 84, 84d supply, fill #2
  Filled 2023-12-05: qty 84, 84d supply, fill #3

## 2023-03-27 NOTE — Progress Notes (Addendum)
   Ashley Summers [birth year] [id number]   History:  42 y.o. [id number] presents as new patient to establish care. Amenorrheic on POPs. IUD removed 12/2021. Had ultrasound 01/2022 that showed 2.9 cm fibroid. Began having intermittent lower abdominal pain a couple of months after IUD removal. Unsure if it is cyclic or random. Pain is mostly crampy but does feel "pinchy" at times. Does have intermittent constipation and can go 5+ days without BM. Not on any bowel regimen for this. Normal pap history. HTN managed by PCP.  Gynecologic History No LMP recorded.   Contraception/Family planning: oral progesterone-only contraceptive Sexually active: Yes, negative STD screening is February  Health Maintenance Last Pap: 01/16/2023. Results were: Normal Last mammogram: 11/30/2022. Results were: Normal Last colonoscopy: Never Last Dexa: Not indicated  Past medical history, past surgical history, family history and social history were all reviewed and documented in the EPIC chart. Married. 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   ROS:  A ROS was performed and pertinent positives and negatives are included.  Exam:  Vitals:   03/27/23 1421  BP: 118/80  Pulse: 73  Resp: 20  SpO2: 98%  Weight: 191 lb 3.2 oz (86.7 kg)  Height: 5' 3.58" (1.615 m)   Body mass index is 33.25 kg/m.  General appearance:  Normal Thyroid:  Symmetrical, normal in size, without palpable masses or nodularity. Respiratory  Auscultation:  Clear without wheezing or rhonchi Cardiovascular  Auscultation:  Regular rate, without rubs, murmurs or gallops  Edema/varicosities:  Not grossly evident Abdominal  Soft,nontender, without masses, guarding or rebound.  Liver/spleen:  No organomegaly noted  Hernia:  None appreciated  Skin  Inspection:  Grossly normal Breasts: Declines, recent GYN exam Genitourinary: Declines, recent GYN exam  Patient informed chaperone available to be present for breast and pelvic exam. Patient has  requested no chaperone to be present. Patient has been advised what will be completed during breast and pelvic exam.   Assessment/Plan:  42 y.o. [id number] to establish care.   Encounter for surveillance of contraceptive pills - Plan: Drospirenone (SLYND) 4 MG TABS daily. Doing well on this. Amenorrheic. Refill x 1 year provided.   Lower abdominal pain - Had ultrasound 01/2022 that showed 2.9 cm fibroid. Reassured that small fibroid not cause for pain. Began having intermittent lower abdominal pain a couple of months after IUD removal. Unsure if it is cyclic or random. Pain is mostly crampy but does feel "pinchy" at times. Could be related to constipation. Recommend discussing with PCP. If pain continues with resolution of GI symptoms pelvic ultrasound recommended.   Constipation, unspecified constipation type - intermittent. Can go 5+ days without BM. Recommend stool softener and Miralax.   Return in 1 year for annual.     Olivia Mackie DNP, 2:57 PM 06/24/2023

## 2023-03-28 ENCOUNTER — Other Ambulatory Visit: Payer: Self-pay

## 2023-04-18 ENCOUNTER — Other Ambulatory Visit (HOSPITAL_COMMUNITY): Payer: Self-pay

## 2023-05-08 ENCOUNTER — Ambulatory Visit (INDEPENDENT_AMBULATORY_CARE_PROVIDER_SITE_OTHER): Payer: 59 | Admitting: Family Medicine

## 2023-05-08 ENCOUNTER — Other Ambulatory Visit (HOSPITAL_COMMUNITY): Payer: Self-pay

## 2023-05-08 ENCOUNTER — Encounter: Payer: Self-pay | Admitting: Family Medicine

## 2023-05-08 VITALS — BP 114/72 | HR 80 | Temp 98.7°F | Wt 191.2 lb

## 2023-05-08 DIAGNOSIS — F411 Generalized anxiety disorder: Secondary | ICD-10-CM

## 2023-05-08 DIAGNOSIS — I10 Essential (primary) hypertension: Secondary | ICD-10-CM

## 2023-05-08 MED ORDER — HYDROXYZINE HCL 10 MG PO TABS
10.0000 mg | ORAL_TABLET | Freq: Three times a day (TID) | ORAL | 6 refills | Status: DC | PRN
  Filled 2023-05-08: qty 90, 30d supply, fill #0
  Filled 2023-08-28: qty 90, 30d supply, fill #1
  Filled 2024-05-05: qty 90, 30d supply, fill #2

## 2023-05-08 NOTE — Progress Notes (Signed)
Established Patient Office Visit   Subjective  Patient ID: Ashley Summers, female    DOB: Oct 11, 1981  Age: 42 y.o. MRN: [id number]  Chief Complaint  Patient presents with   Medical Management of Chronic Issues    BP    Patient is a 42 year old female seen for follow-up and med management.  Patient stopped taking Norvasc 2.5 mg.  Now only taking HCTZ 25 mg daily as BP well-controlled at home.  Patient denies headaches, CP, LE edema, changes in vision.  Patient requesting refill on hydroxyzine for anxiety.  Notes increase in anxiety.  Started taking hydroxyzine to work.  Before pt would forget to take medicine.  Now becoming anxious at work.  Previously only anxious when riding in car.  Patient notes increased stress as her daughter is going to [facility], a son is going to [place], and her other son moved back in.  Taking Tylenol PM or Advil PM to go to sleep.  If doesn't willl fall asleep then wake up in 2 hrs, or lay in bed unable to go to sleep due to mind racing.  Tried counseling in the past.  Patient exercising by going to the gym and walking 3 times per week.      ROS Negative unless stated above    Objective:     BP 114/72 (BP Location: Left Arm, Patient Position: Sitting, Cuff Size: Normal)   Pulse 80   Temp 98.7 F (37.1 C) (Oral)   Wt 191 lb 3.2 oz (86.7 kg)   SpO2 98%   BMI 33.25 kg/m    Physical Exam Constitutional:      General: She is not in acute distress.    Appearance: Normal appearance.  HENT:     Head: Normocephalic and atraumatic.     Nose: Nose normal.     Mouth/Throat:     Mouth: Mucous membranes are moist.  Cardiovascular:     Rate and Rhythm: Normal rate and regular rhythm.     Heart sounds: Normal heart sounds. No murmur heard.    No gallop.  Pulmonary:     Effort: Pulmonary effort is normal. No respiratory distress.     Breath sounds: Normal breath sounds. No wheezing, rhonchi or rales.  Skin:    General: Skin is warm and dry.   Neurological:     Mental Status: She is alert and oriented to person, place, and time.       05/08/2023    8:38 AM 11/08/2022    8:34 AM 07/04/2022   11:29 AM 05/21/2022   10:18 AM 10/12/2021   12:06 PM  Depression screen PHQ 2/9  Decreased Interest 1 0 1 0 0  Down, Depressed, Hopeless 1 0 1 0 0  PHQ - 2 Score 2 0 2 0 0  Altered sleeping 0 0 0 0   Tired, decreased energy 1 1 1  0   Change in appetite 2 0 0 0   Feeling bad or failure about yourself  1 0 0 0   Trouble concentrating 0 0 0 2   Moving slowly or fidgety/restless 0 0 0 0   Suicidal thoughts 0 0 0 0   PHQ-9 Score 6 1 3 2    Difficult doing work/chores Not difficult at all Not difficult at all Not difficult at all Not difficult at all       05/08/2023    8:38 AM 05/21/2022   10:54 AM 08/05/2020    1:56 PM 06/17/2020  1:51 PM  GAD 7 : Generalized Anxiety Score  Nervous, Anxious, on Edge 2 2 2 2   Control/stop worrying 2 3 2 2   Worry too much - different things 2 3 2 2   Trouble relaxing 1 1 1  0  Restless 0 1 0 0  Easily annoyed or irritable 1 3 0 3  Afraid - awful might happen 2 3 2  0  Total GAD 7 Score 10 16 9 9   Anxiety Difficulty Not difficult at all Not difficult at all Not difficult at all        No results found for any visits on 05/08/23.    Assessment & Plan:  Essential hypertension -Controlled -Continue HCTZ 25 mg daily -Norvasc 2.5 mg d/c'd. -Lifestyle modifications  GAD (generalized anxiety disorder) -GAD7 score 10 -PHQ9 score 6 -Continue hydroxyzine 10 mg 3 times daily as needed -Patient advised to reconsider counseling -     hydrOXYzine HCl; Take 1 tablet (10 mg total) by mouth 3 (three) times daily as needed for anxiety.  Dispense: 90 tablet; Refill: 6   Return in about 6 months (around 11/07/2023) for physical, chronic conditions.   Deeann Saint, MD

## 2023-05-10 ENCOUNTER — Ambulatory Visit: Payer: Self-pay | Admitting: Family Medicine

## 2023-05-10 ENCOUNTER — Other Ambulatory Visit (HOSPITAL_COMMUNITY): Payer: Self-pay

## 2023-06-24 NOTE — Addendum Note (Signed)
Addended byWyline Beady on: 06/24/2023 02:57 PM   Modules accepted: Level of Service

## 2023-08-16 IMAGING — MG MM DIGITAL SCREENING BILAT W/ TOMO AND CAD
6 of 10 series · 6 of 30 positions shown · non-contrast
Comparison: Previous exam(s).

CLINICAL DATA: Screening.

EXAM:
DIGITAL SCREENING BILATERAL MAMMOGRAM WITH TOMOSYNTHESIS AND CAD
TECHNIQUE: Bilateral screening digital craniocaudal and mediolateral oblique
mammograms were obtained. Bilateral screening digital breast
tomosynthesis was performed. The images were evaluated with
computer-aided detection.

[L MLO synth-2D]
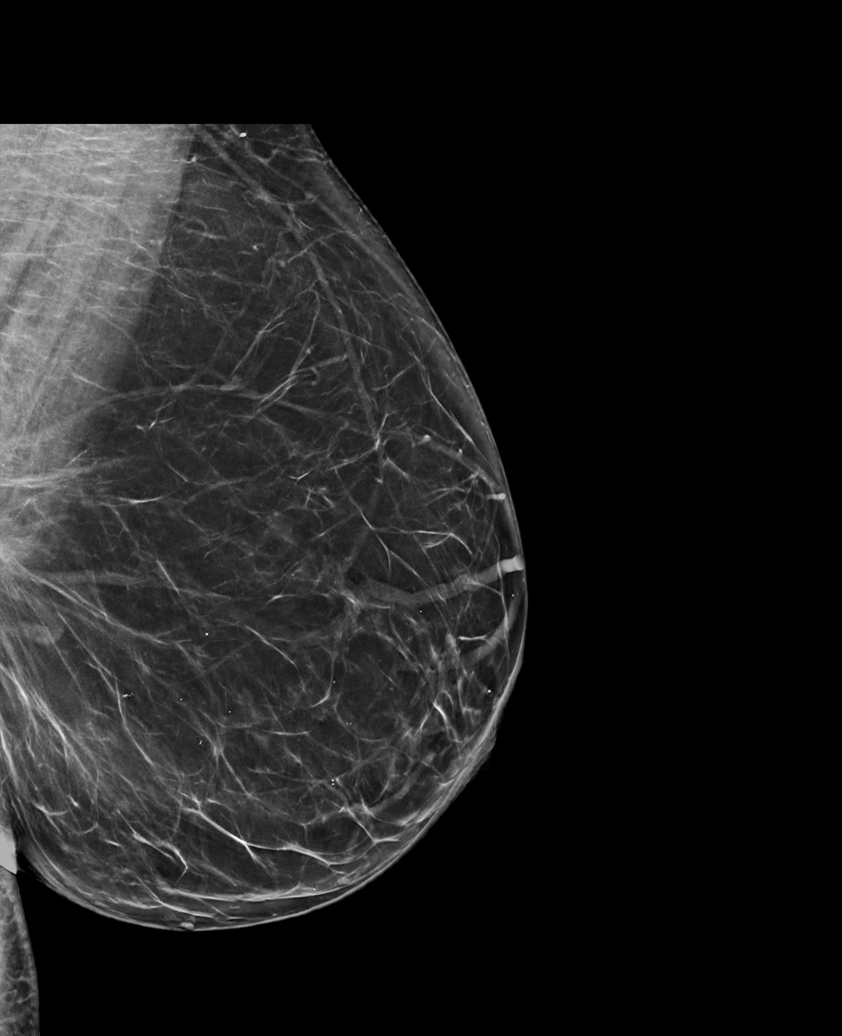

[L CC synth-2D (1 of 2)]
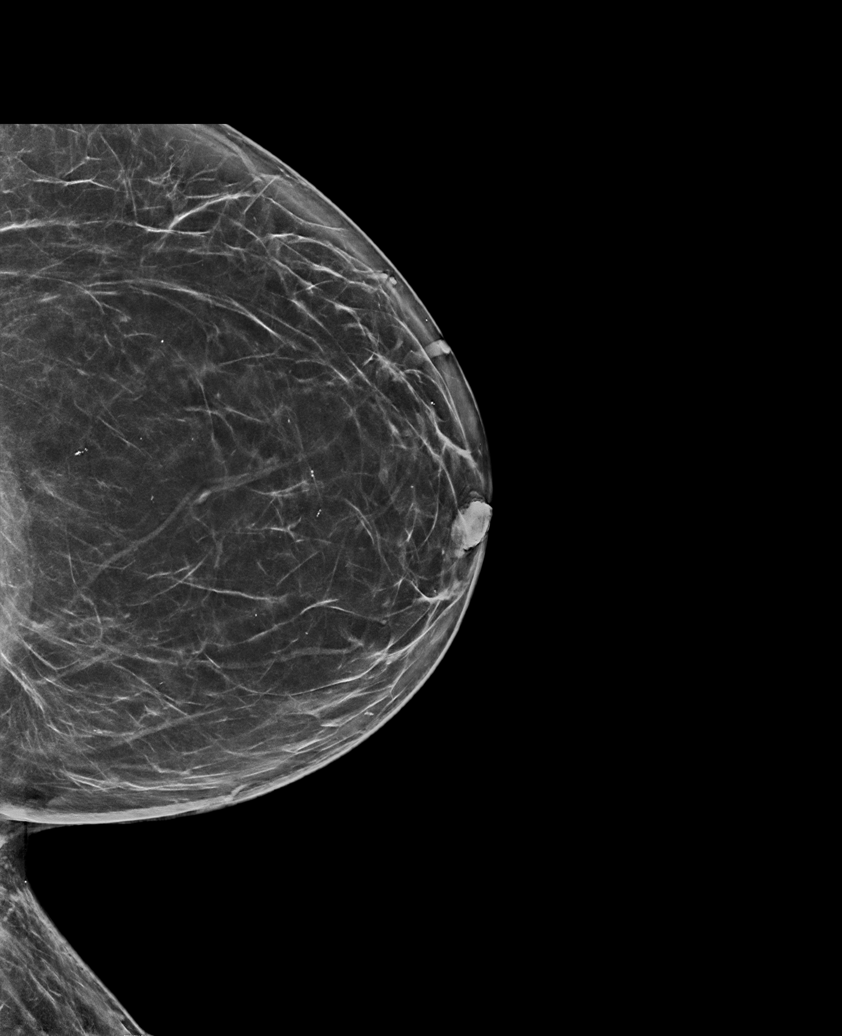

[L CC synth-2D (2 of 2)]
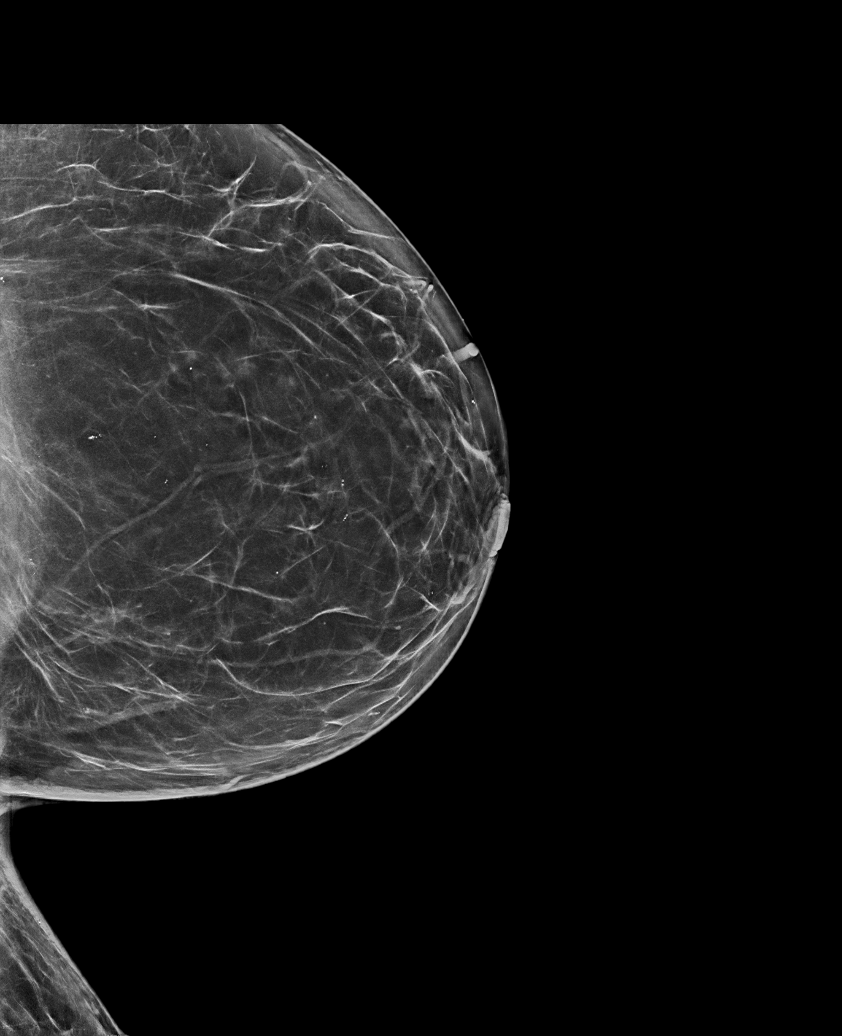

[R CC synth-2D]
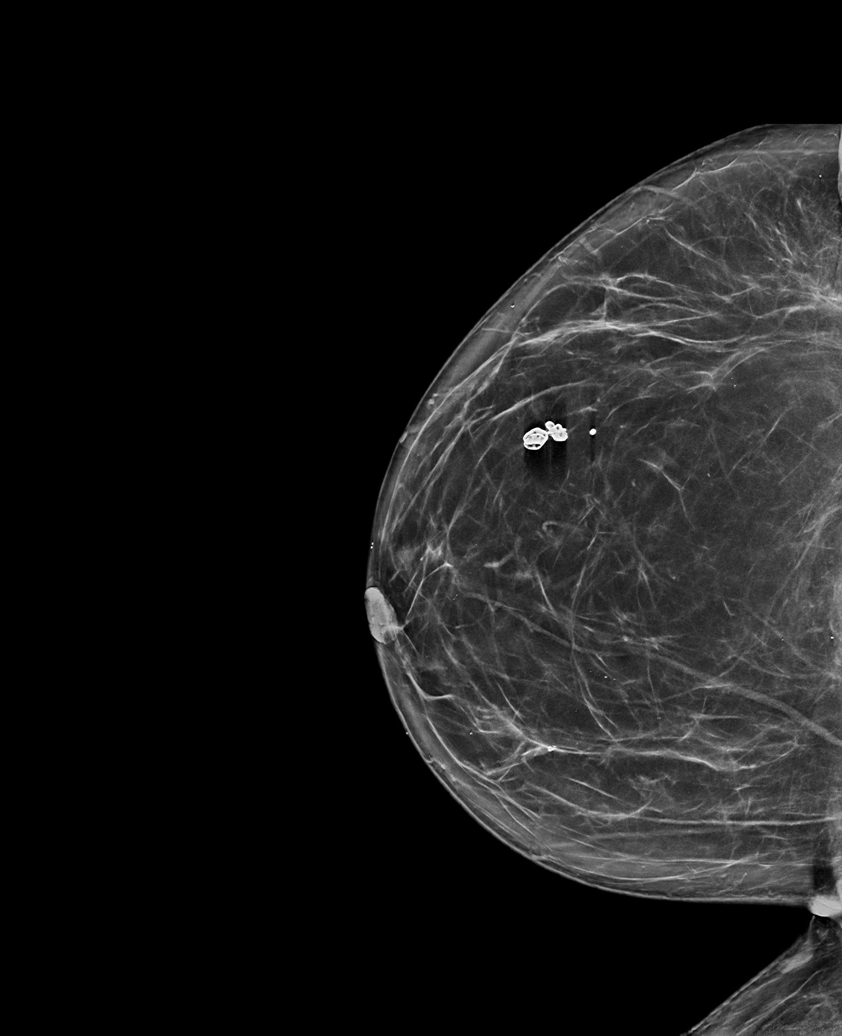

[R MLO synth-2D]
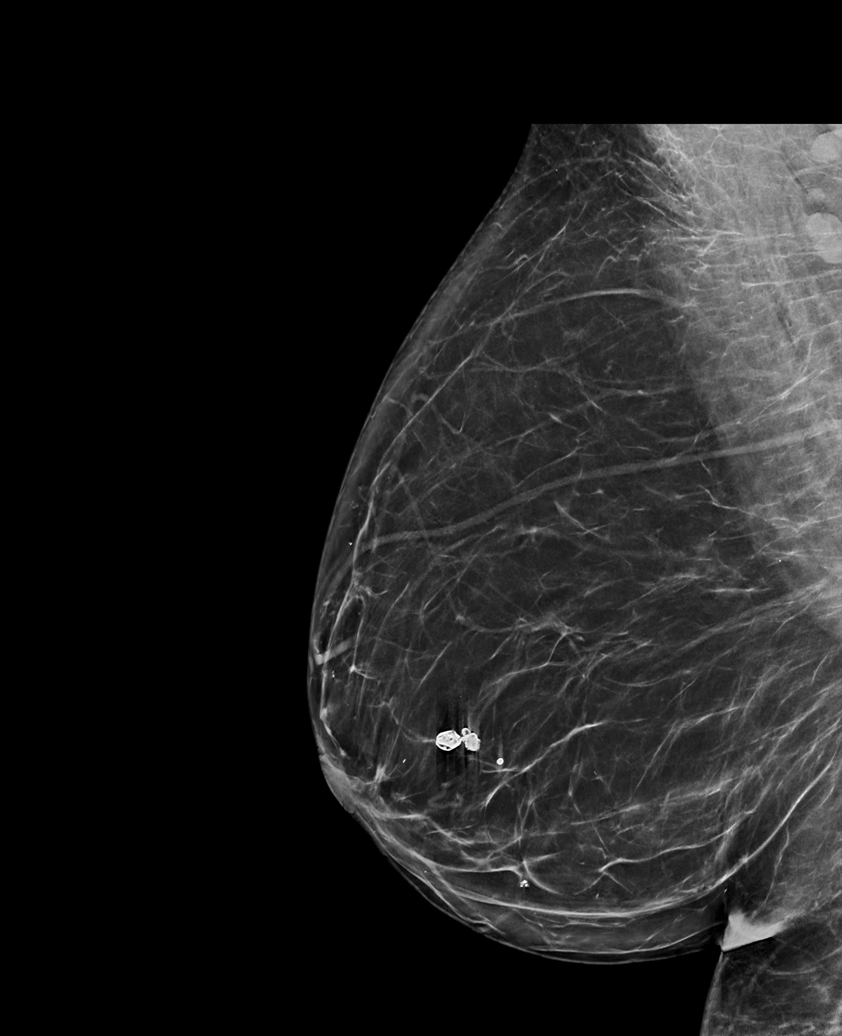

[R MLO tomo · tomo slice 44/87.0]
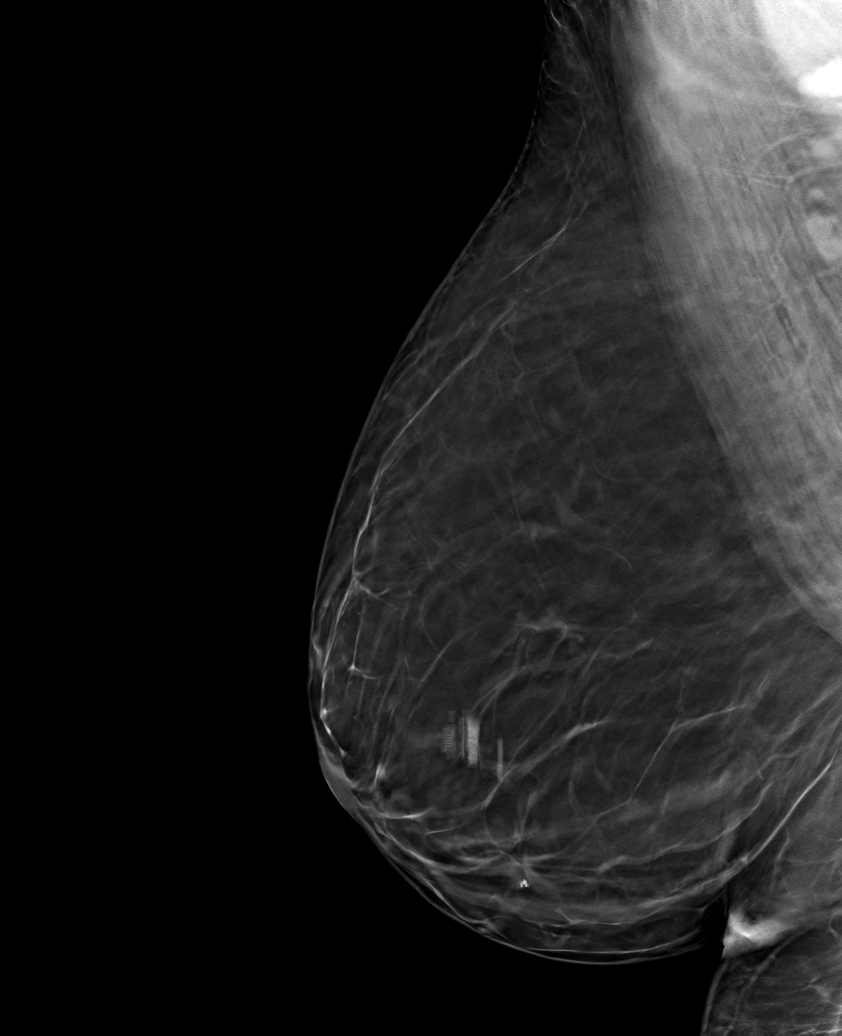

[6 of 30 positions shown; findings below may reference images not displayed]

ACR Breast Density Category b: There are scattered areas of
fibroglandular density.
FINDINGS: There are no findings suspicious for malignancy.
IMPRESSION: No mammographic evidence of malignancy. A result letter of this
screening mammogram will be mailed directly to the patient.

RECOMMENDATION:
Screening mammogram in one year. (Code:51-O-LD2)

BI-RADS CATEGORY  1: Negative.

## 2023-08-28 ENCOUNTER — Other Ambulatory Visit: Payer: Self-pay | Admitting: Family Medicine

## 2023-08-29 ENCOUNTER — Other Ambulatory Visit: Payer: Self-pay

## 2023-09-03 ENCOUNTER — Other Ambulatory Visit (HOSPITAL_COMMUNITY): Payer: Self-pay

## 2023-09-04 ENCOUNTER — Other Ambulatory Visit (HOSPITAL_COMMUNITY): Payer: Self-pay

## 2023-09-05 ENCOUNTER — Other Ambulatory Visit (HOSPITAL_COMMUNITY): Payer: Self-pay

## 2023-09-05 MED ORDER — CERTAVITE/ANTIOXIDANTS PO TABS
1.0000 | ORAL_TABLET | Freq: Every day | ORAL | 3 refills | Status: AC
Start: 2023-09-05 — End: ?
  Filled 2023-09-05 – 2023-10-17 (×2): qty 90, 90d supply, fill #0
  Filled 2024-01-29: qty 90, 90d supply, fill #1
  Filled 2024-06-04: qty 90, 90d supply, fill #2

## 2023-09-05 MED ORDER — CETIRIZINE HCL 10 MG PO TABS
10.0000 mg | ORAL_TABLET | Freq: Every day | ORAL | 3 refills | Status: DC
  Filled 2023-09-05 – 2023-10-17 (×2): qty 90, 90d supply, fill #0
  Filled 2024-01-29: qty 90, 90d supply, fill #1
  Filled 2024-06-04: qty 90, 90d supply, fill #2
  Filled 2024-08-15: qty 90, 90d supply, fill #3

## 2023-09-16 ENCOUNTER — Other Ambulatory Visit (HOSPITAL_COMMUNITY): Payer: Self-pay

## 2023-09-17 ENCOUNTER — Encounter: Payer: Self-pay | Admitting: Family Medicine

## 2023-10-17 ENCOUNTER — Other Ambulatory Visit (HOSPITAL_COMMUNITY): Payer: Self-pay

## 2023-10-18 IMAGING — US US PELVIS COMPLETE WITH TRANSVAGINAL
1 series · 15 of 25 positions shown · non-contrast
Comparison: None

CLINICAL DATA: Enlarged uterus, history of Caesarean section, LMP
01/23/2022; had IUD removed 12/07/2021

EXAM:
TRANSABDOMINAL AND TRANSVAGINAL ULTRASOUND OF PELVIS
TECHNIQUE: Both transabdominal and transvaginal ultrasound examinations of the
pelvis were performed. Transabdominal technique was performed for
global imaging of the pelvis including uterus, ovaries, adnexal
regions, and pelvic cul-de-sac. It was necessary to proceed with
endovaginal exam following the transabdominal exam to visualize the
ovaries and adnexa.

[Series 1: us pelvis complete with transvaginal · 118 acquisitions, 15 frames shown]
[im 1/118]
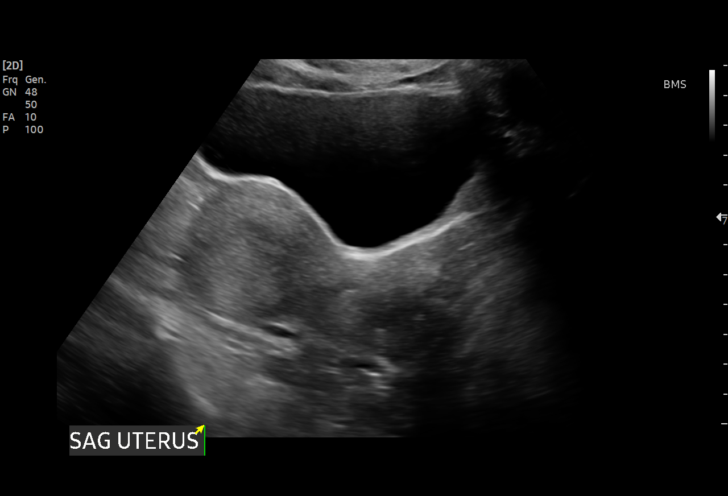
[im 10/118]
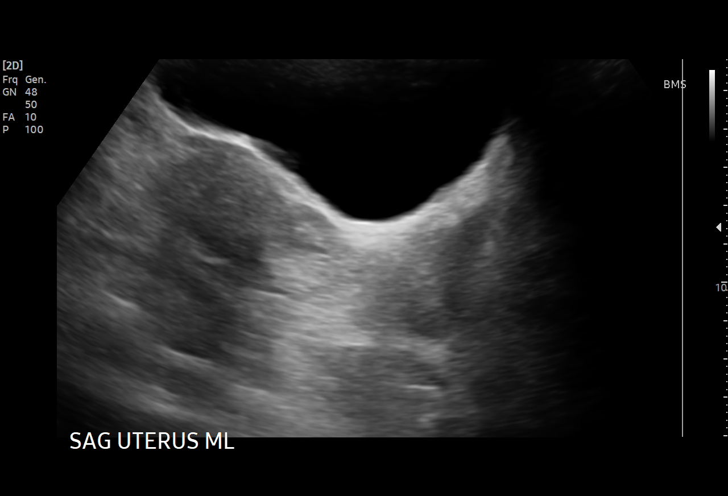
[im 20/118]
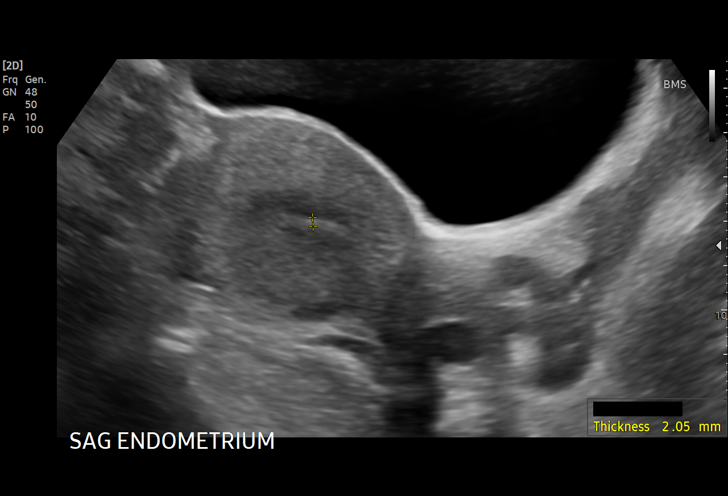
[im 25/118]
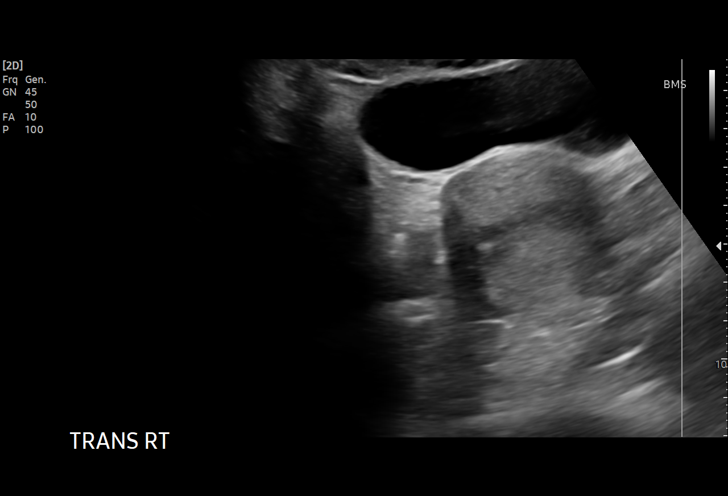
[im 35/118]
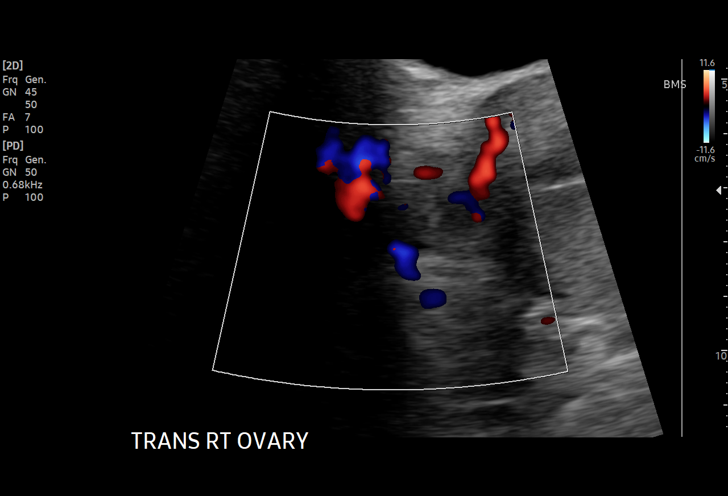
[im 44/118]
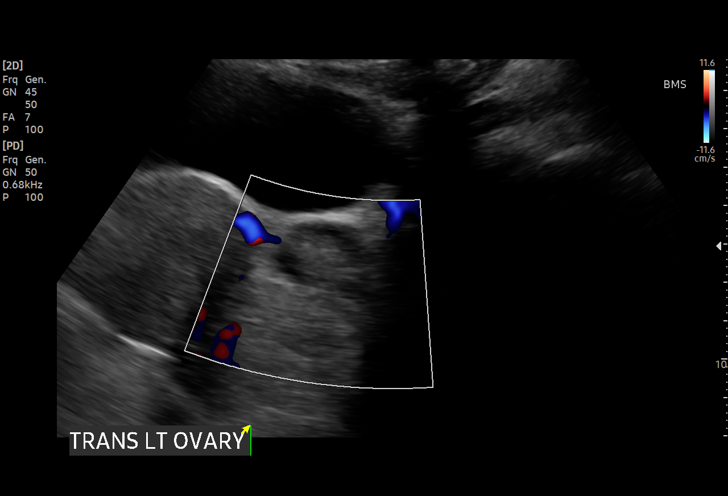
[im 49/118]
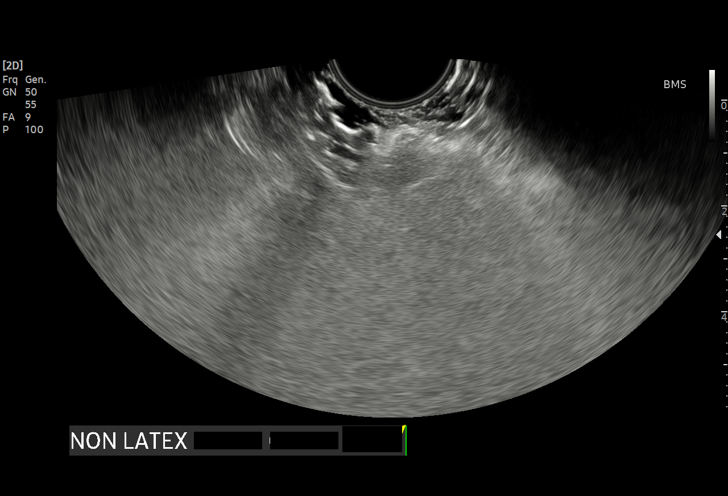
[im 59/118]
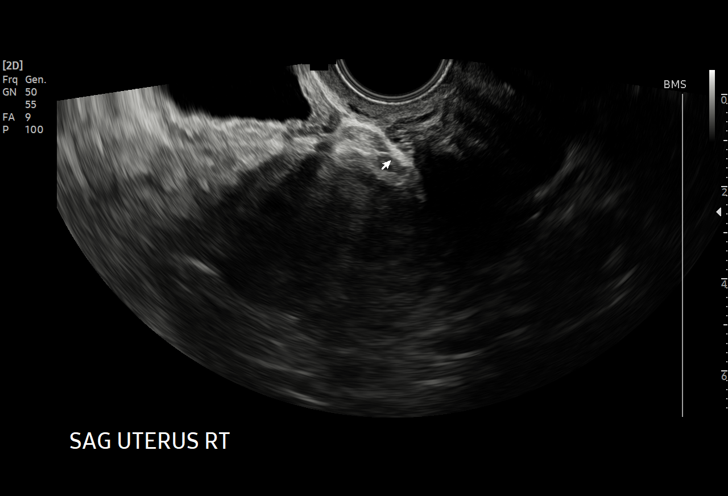
[im 69/118]
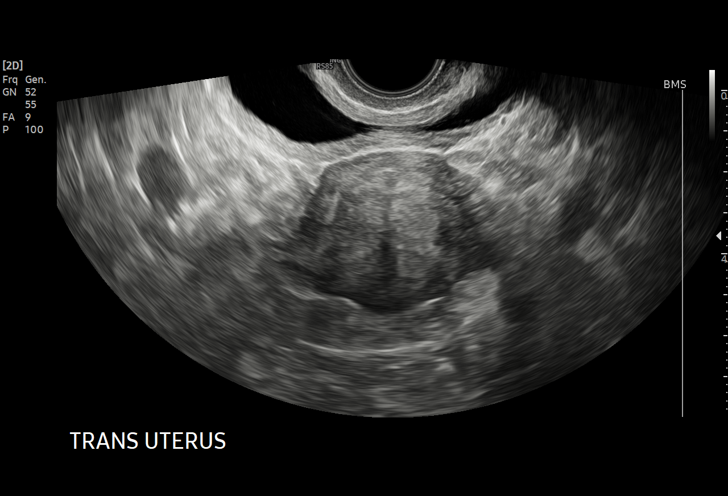
[im 74/118]
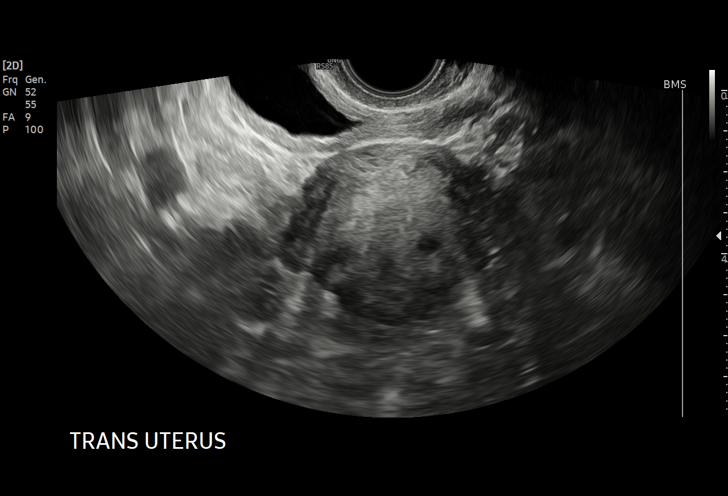
[im 83/118]
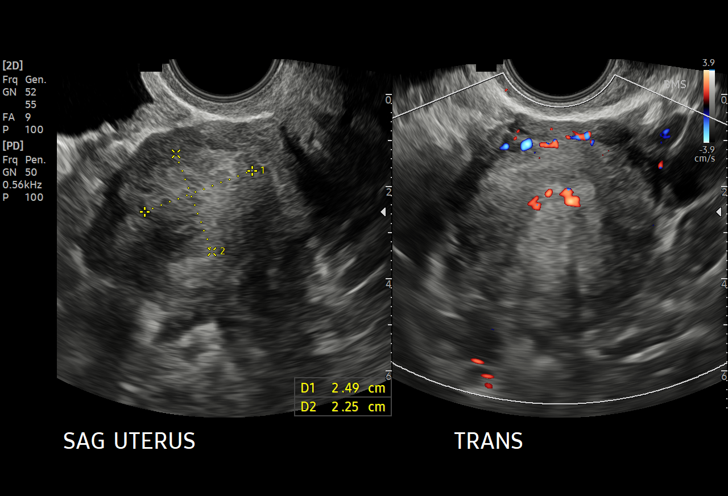
[im 93/118]
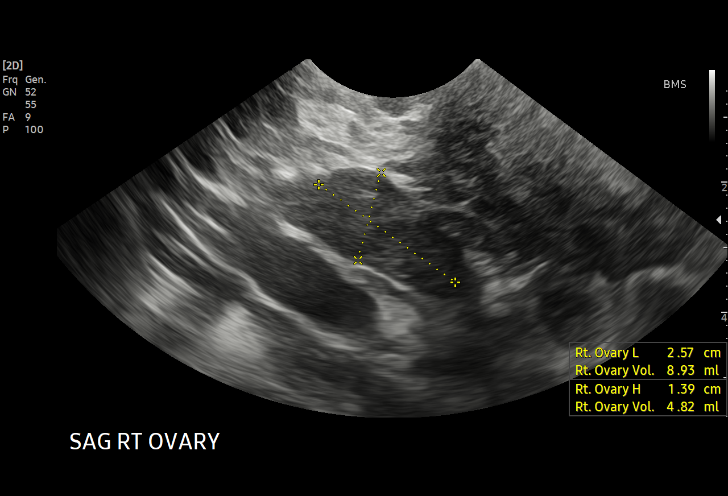
[im 98/118]
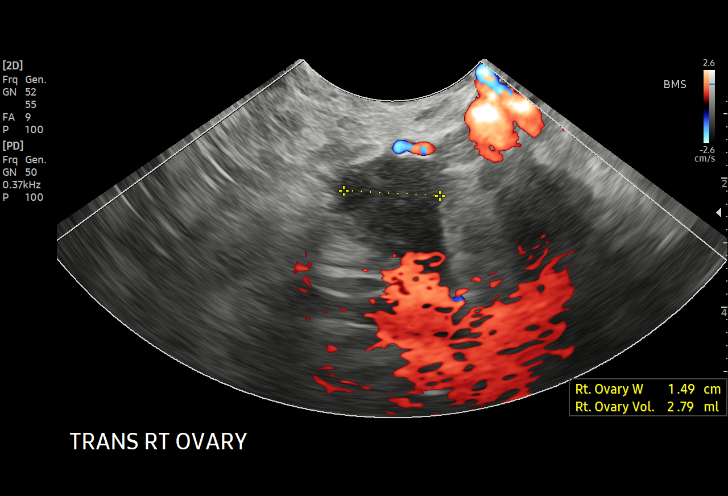
[im 108/118]
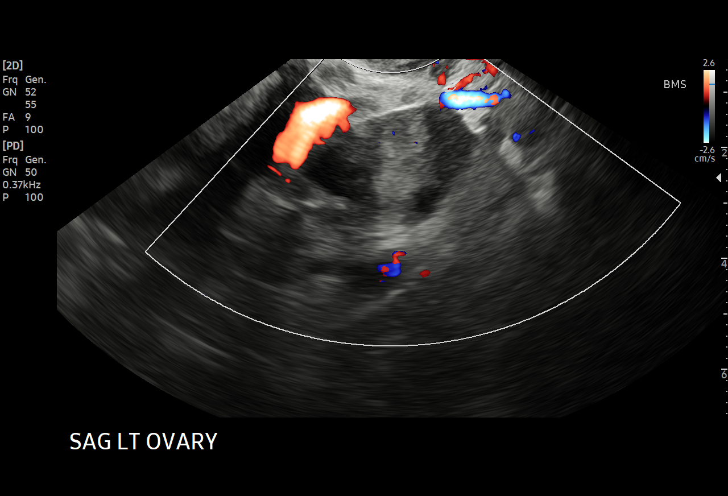
[im 118/118]
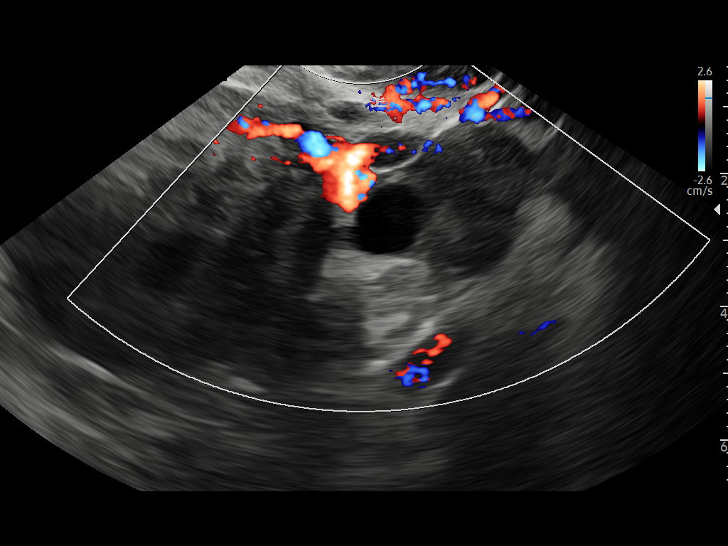

[15 of 25 positions shown; findings below may reference images not displayed]

FINDINGS: Uterus

Measurements: 10.1 x 4.6 x 4.8 cm = volume: 116 mL. Anteverted.
Anterior wall Caesarean section scar. Heterogeneous myometrium.
Multiple nabothian cysts. Questionable anterior wall intramural
leiomyoma 2.5 x 2.3 x 2.9 cm versus heterogeneous myometrium. No
additional masses.

Endometrium

Thickness: 6 mm.  No endometrial fluid or mass

Right ovary

Measurements: 2.6 x 1.4 x 1.5 cm = volume: 2.8 mL. Normal morphology
without mass

Left ovary

Measurements: 3.0 x 1.9 x 2.9 cm = volume: 8.7 mL. Normal morphology
without mass

Other findings

No free pelvic fluid.  No adnexal masses.
IMPRESSION: Questionable anterior wall intramural leiomyoma 2.9 cm diameter
versus heterogeneous myometrium.

Remainder of exam unremarkable.

## 2023-10-24 ENCOUNTER — Other Ambulatory Visit (HOSPITAL_COMMUNITY): Payer: Self-pay

## 2023-11-13 ENCOUNTER — Encounter: Payer: Self-pay | Admitting: Family Medicine

## 2023-11-13 ENCOUNTER — Other Ambulatory Visit (HOSPITAL_COMMUNITY): Payer: Self-pay

## 2023-11-13 ENCOUNTER — Ambulatory Visit (INDEPENDENT_AMBULATORY_CARE_PROVIDER_SITE_OTHER): Payer: 59 | Admitting: Family Medicine

## 2023-11-13 VITALS — BP 100/80 | HR 87 | Temp 98.4°F | Ht 64.17 in | Wt 195.2 lb

## 2023-11-13 DIAGNOSIS — Z0000 Encounter for general adult medical examination without abnormal findings: Secondary | ICD-10-CM

## 2023-11-13 DIAGNOSIS — I10 Essential (primary) hypertension: Secondary | ICD-10-CM | POA: Diagnosis not present

## 2023-11-13 DIAGNOSIS — E7800 Pure hypercholesterolemia, unspecified: Secondary | ICD-10-CM

## 2023-11-13 DIAGNOSIS — K219 Gastro-esophageal reflux disease without esophagitis: Secondary | ICD-10-CM

## 2023-11-13 DIAGNOSIS — E559 Vitamin D deficiency, unspecified: Secondary | ICD-10-CM

## 2023-11-13 LAB — COMPREHENSIVE METABOLIC PANEL
ALT: 21 U/L (ref 0–35)
AST: 15 U/L (ref 0–37)
Albumin: 4.3 g/dL (ref 3.5–5.2)
Alkaline Phosphatase: 63 U/L (ref 39–117)
BUN: 16 mg/dL (ref 6–23)
CO2: 29 meq/L (ref 19–32)
Calcium: 9.3 mg/dL (ref 8.4–10.5)
Chloride: 103 meq/L (ref 96–112)
Creatinine, Ser: 1.08 mg/dL (ref 0.40–1.20)
GFR: 62.36 mL/min (ref >60.00)
Glucose, Bld: 106 mg/dL — ABNORMAL HIGH (ref 70–99)
Potassium: 3.8 meq/L (ref 3.5–5.1)
Sodium: 140 meq/L (ref 135–145)
Total Bilirubin: 0.6 mg/dL (ref 0.2–1.2)
Total Protein: 7.2 g/dL (ref 6.0–8.3)

## 2023-11-13 LAB — CBC WITH DIFFERENTIAL/PLATELET
Basophils Absolute: 0 10*3/uL (ref 0.0–0.1)
Basophils Relative: 0.6 % (ref 0.0–3.0)
Eosinophils Absolute: 0.3 10*3/uL (ref 0.0–0.7)
Eosinophils Relative: 6.8 % — ABNORMAL HIGH (ref 0.0–5.0)
HCT: 41 % (ref 36.0–46.0)
Hemoglobin: 13.6 g/dL (ref 12.0–15.0)
Lymphocytes Relative: 44.9 % (ref 12.0–46.0)
Lymphs Abs: 2.3 10*3/uL (ref 0.7–4.0)
MCHC: 33.2 g/dL (ref 30.0–36.0)
MCV: 93.1 fL (ref 78.0–100.0)
Monocytes Absolute: 0.5 10*3/uL (ref 0.1–1.0)
Monocytes Relative: 8.9 % (ref 3.0–12.0)
Neutro Abs: 2 10*3/uL (ref 1.4–7.7)
Neutrophils Relative %: 38.8 % — ABNORMAL LOW (ref 43.0–77.0)
Platelets: 195 10*3/uL (ref 150.0–400.0)
RBC: 4.4 Mil/uL (ref 3.87–5.11)
RDW: 13.1 % (ref 11.5–15.5)
WBC: 5.1 10*3/uL (ref 4.0–10.5)

## 2023-11-13 LAB — HEMOGLOBIN A1C: Hgb A1c MFr Bld: 5.4 % (ref 4.6–6.5)

## 2023-11-13 LAB — LIPID PANEL
Cholesterol: 207 mg/dL — ABNORMAL HIGH (ref 0–200)
HDL: 55.9 mg/dL (ref >39.00)
LDL Cholesterol: 120 mg/dL — ABNORMAL HIGH (ref 0–99)
NonHDL: 151.08
Total CHOL/HDL Ratio: 4
Triglycerides: 156 mg/dL — ABNORMAL HIGH (ref 0.0–149.0)
VLDL: 31.2 mg/dL (ref 0.0–40.0)

## 2023-11-13 LAB — T4, FREE: Free T4: 1.17 ng/dL (ref 0.60–1.60)

## 2023-11-13 LAB — TSH: TSH: 1.36 u[IU]/mL (ref 0.35–5.50)

## 2023-11-13 MED ORDER — PANTOPRAZOLE SODIUM 40 MG PO TBEC
40.0000 mg | DELAYED_RELEASE_TABLET | Freq: Every day | ORAL | 2 refills | Status: DC
  Filled 2023-11-13: qty 30, 30d supply, fill #0
  Filled 2023-12-09 – 2024-01-29 (×3): qty 30, 30d supply, fill #1
  Filled 2024-03-09: qty 30, 30d supply, fill #2

## 2023-11-13 NOTE — Progress Notes (Signed)
Established Patient Office Visit   Subjective  Patient ID: Ashley Summers, female    DOB: 01/02/1981  Age: 42 y.o. MRN: [id number]  Chief Complaint  Patient presents with   Annual Exam    Heart burn, started over a year ago, lower back pain, started a month ago sharp pain, rate of pain 2 out of 10,     Patient is a 42 year old female seen for CPE and several concerns.  Patient states she is having increased heartburn symptoms.  Endorses tasting acid in her mouth, having burning in throat and nose.  Patient unsure which foods cause symptoms.  Taking over-the-counter acid relief before eating certain foods like Pasta with tomato based sauces.  Patient also mentions low back pain times several weeks.  Noticed more after sitting in a new part-time job.  Patient states her mattress is fairly new.  Has mammogram later this month.    Patient Active Problem List   Diagnosis Date Noted   Panniculitis 09/20/2020   Essential hypertension 09/09/2020   Palpitations 09/09/2020   OSA (obstructive sleep apnea) 04/23/2018   Hemorrhoid 02/03/2016   Past Medical History:  Diagnosis Date   Allergy    Anxiety    Essential hypertension 09/09/2020   Hypertension    Palpitations 09/09/2020   Past Surgical History:  Procedure Laterality Date   BREAST SURGERY     Reduction    Hammertoe correction     REDUCTION MAMMAPLASTY     Social History   Tobacco Use   Smoking status: Never   Smokeless tobacco: Never  Vaping Use   Vaping status: Never Used  Substance Use Topics   Alcohol use: Yes    Alcohol/week: 0.0 standard drinks of alcohol    Comment: occasional   Drug use: No   Family History  Problem Relation Age of Onset   Hypertension Mother    Stroke Mother    Pulmonary embolism Mother    Hypertension Brother    Hyperlipidemia Maternal Grandmother    Hypertension Maternal Grandmother    Allergies  Allergen Reactions   Penicillins Rash      ROS Negative unless stated  above    Objective:     BP 100/80 (BP Location: Right Arm, Patient Position: Sitting, Cuff Size: Large)   Pulse 87   Temp 98.4 F (36.9 C) (Oral)   Ht 5' 4.17" (1.63 m)   Wt 195 lb 3.2 oz (88.5 kg)   SpO2 97%   BMI 33.33 kg/m  BP Readings from Last 3 Encounters:  11/13/23 100/80  05/08/23 114/72  03/27/23 118/80   Wt Readings from Last 3 Encounters:  11/13/23 195 lb 3.2 oz (88.5 kg)  05/08/23 191 lb 3.2 oz (86.7 kg)  03/27/23 191 lb 3.2 oz (86.7 kg)      Physical Exam Constitutional:      Appearance: Normal appearance.  HENT:     Head: Normocephalic and atraumatic.     Right Ear: Tympanic membrane, ear canal and external ear normal.     Left Ear: Tympanic membrane, ear canal and external ear normal.     Nose: Nose normal.     Mouth/Throat:     Mouth: Mucous membranes are moist.     Pharynx: No oropharyngeal exudate or posterior oropharyngeal erythema.  Eyes:     General: No scleral icterus.    Extraocular Movements: Extraocular movements intact.     Conjunctiva/sclera: Conjunctivae normal.     Pupils: Pupils are equal, round, and reactive to  light.  Neck:     Thyroid: No thyromegaly.  Cardiovascular:     Rate and Rhythm: Normal rate and regular rhythm.     Pulses: Normal pulses.     Heart sounds: Normal heart sounds. No murmur heard.    No friction rub.  Pulmonary:     Effort: Pulmonary effort is normal.     Breath sounds: Normal breath sounds. No wheezing, rhonchi or rales.  Abdominal:     General: Bowel sounds are normal.     Palpations: Abdomen is soft.     Tenderness: There is no abdominal tenderness.  Musculoskeletal:        General: No deformity. Normal range of motion.  Lymphadenopathy:     Cervical: No cervical adenopathy.  Skin:    General: Skin is warm and dry.     Findings: No lesion.  Neurological:     General: No focal deficit present.     Mental Status: She is alert and oriented to person, place, and time.  Psychiatric:        Mood and  Affect: Mood normal.        Thought Content: Thought content normal.      No results found for any visits on 11/13/23.    Assessment & Plan:  Well adult exam -     Hemoglobin A1c; Future -     Lipid panel; Future  Essential hypertension -     Comprehensive metabolic panel; Future -     TSH; Future -     T4, free  Gastroesophageal reflux disease, unspecified whether esophagitis present -     CBC with Differential/Platelet; Future -     Comprehensive metabolic panel; Future -     Pantoprazole Sodium; Take 1 tablet (40 mg total) by mouth daily before breakfast.  Dispense: 30 tablet; Refill: 2  Elevated LDL cholesterol level -     Lipid panel; Future   Age-appropriate health screenings discussed.  Obtain labs.  Immunizations reviewed.  Pap with [facility] done 12/11/2021.  Mammogram done 11/30/2022 scheduled for later this month.  Colonoscopy at age 8.  BP controlled.  Continue current HCTZ 25 mg daily.  Advised to keep food diary and avoid foods known to cause acid reflux symptoms.  Start Protonix 40 mg daily.  For continued or worsening symptoms referral to GI.  Advised to set up follow-up for back pain.  Return in about 5 weeks (around 12/18/2023) for Back pain and acid reflux.   Deeann Saint, MD

## 2023-11-14 ENCOUNTER — Other Ambulatory Visit (HOSPITAL_COMMUNITY): Payer: Self-pay

## 2023-12-02 ENCOUNTER — Other Ambulatory Visit (HOSPITAL_COMMUNITY): Payer: Self-pay

## 2023-12-02 ENCOUNTER — Other Ambulatory Visit: Payer: Self-pay | Admitting: Family Medicine

## 2023-12-02 ENCOUNTER — Ambulatory Visit (HOSPITAL_BASED_OUTPATIENT_CLINIC_OR_DEPARTMENT_OTHER)
Admission: RE | Admit: 2023-12-02 | Discharge: 2023-12-02 | Disposition: A | Discharge status: Home / Self Care | Payer: 59 | Source: Ambulatory Visit | Attending: Nurse Practitioner | Admitting: Nurse Practitioner

## 2023-12-02 ENCOUNTER — Encounter (HOSPITAL_BASED_OUTPATIENT_CLINIC_OR_DEPARTMENT_OTHER): Payer: Self-pay | Admitting: Radiology

## 2023-12-02 DIAGNOSIS — Z1231 Encounter for screening mammogram for malignant neoplasm of breast: Secondary | ICD-10-CM | POA: Insufficient documentation

## 2023-12-02 DIAGNOSIS — I10 Essential (primary) hypertension: Secondary | ICD-10-CM

## 2023-12-02 MED ORDER — HYDROCHLOROTHIAZIDE 25 MG PO TABS
25.0000 mg | ORAL_TABLET | Freq: Every day | ORAL | 2 refills | Status: DC
  Filled 2023-12-02: qty 90, 90d supply, fill #0
  Filled 2024-03-09: qty 90, 90d supply, fill #1
  Filled 2024-06-04: qty 90, 90d supply, fill #2

## 2023-12-10 ENCOUNTER — Other Ambulatory Visit (HOSPITAL_COMMUNITY): Payer: Self-pay

## 2023-12-12 ENCOUNTER — Other Ambulatory Visit (HOSPITAL_COMMUNITY): Payer: Self-pay

## 2023-12-18 ENCOUNTER — Ambulatory Visit: Payer: Commercial Managed Care - PPO | Admitting: Family Medicine

## 2023-12-18 ENCOUNTER — Encounter: Payer: Self-pay | Admitting: Family Medicine

## 2023-12-18 VITALS — BP 110/80 | HR 89 | Temp 98.4°F | Ht 64.17 in | Wt 202.5 lb

## 2023-12-18 DIAGNOSIS — K219 Gastro-esophageal reflux disease without esophagitis: Secondary | ICD-10-CM | POA: Diagnosis not present

## 2023-12-18 DIAGNOSIS — I10 Essential (primary) hypertension: Secondary | ICD-10-CM

## 2023-12-18 DIAGNOSIS — F411 Generalized anxiety disorder: Secondary | ICD-10-CM | POA: Diagnosis not present

## 2023-12-18 DIAGNOSIS — K5900 Constipation, unspecified: Secondary | ICD-10-CM

## 2023-12-18 NOTE — Progress Notes (Signed)
 Established Patient Office Visit   Subjective  Patient ID: Ashley Summers, female    DOB: 04-05-1981  Age: 43 y.o. MRN: [id number]  Chief Complaint  Patient presents with   Medical Management of Chronic Issues    Back pain and acid reflux follow-up     Pt is a 43 yo female who is seen for f/u.  Pt states she is feeling better. No longer having heart burn symptoms since taking Protonix  consistently.  Has not had any pasta sauce.  Has been drinking wine without issues.  Patient also notes improvement in back pain since taking MiraLAX consistently.  Taking MiraLAX every other day.  Having a BM daily or twice daily.  Patient denies having to strain.  Attempted to stop taking MiraLAX but constipation returned.  Patient also making lifestyle modifications.  Plans to start exercising at the gym.  Using a coffee creamer with fiber.  Patient notes some continued issues with sleep.  May go to bed without issue but wakes up around 2 AM unable to go back to sleep.  States worries about her kids and various other things.    Patient Active Problem List   Diagnosis Date Noted   Panniculitis 09/20/2020   Essential hypertension 09/09/2020   Palpitations 09/09/2020   OSA (obstructive sleep apnea) 04/23/2018   Hemorrhoid 02/03/2016   Past Medical History:  Diagnosis Date   Allergy    Anxiety    Essential hypertension 09/09/2020   Hypertension    Palpitations 09/09/2020   Past Surgical History:  Procedure Laterality Date   BREAST SURGERY     Reduction    Hammertoe correction     REDUCTION MAMMAPLASTY     Social History   Tobacco Use   Smoking status: Never   Smokeless tobacco: Never  Vaping Use   Vaping status: Never Used  Substance Use Topics   Alcohol use: Yes    Alcohol/week: 0.0 standard drinks of alcohol    Comment: occasional   Drug use: No   Family History  Problem Relation Age of Onset   Hypertension Mother    Stroke Mother    Pulmonary embolism Mother     Hypertension Brother    Hyperlipidemia Maternal Grandmother    Hypertension Maternal Grandmother    Allergies  Allergen Reactions   Penicillins Rash      ROS Negative unless stated above    Objective:     BP 110/80 (BP Location: Left Arm, Patient Position: Sitting, Cuff Size: Large)   Pulse 89   Temp 98.4 F (36.9 C) (Oral)   Ht 5' 4.17" (1.63 m)   Wt 202 lb 8 oz (91.9 kg)   LMP  (LMP Unknown)   SpO2 97%   BMI 34.58 kg/m  BP Readings from Last 3 Encounters:  12/18/23 110/80  11/13/23 100/80  05/08/23 114/72   Wt Readings from Last 3 Encounters:  12/18/23 202 lb 8 oz (91.9 kg)  11/13/23 195 lb 3.2 oz (88.5 kg)  05/08/23 191 lb 3.2 oz (86.7 kg)      Physical Exam Constitutional:      General: She is not in acute distress.    Appearance: Normal appearance.  HENT:     Head: Normocephalic and atraumatic.     Nose: Nose normal.     Mouth/Throat:     Mouth: Mucous membranes are moist.  Cardiovascular:     Rate and Rhythm: Normal rate and regular rhythm.     Heart sounds: Normal heart sounds.  No murmur heard.    No gallop.  Pulmonary:     Effort: Pulmonary effort is normal. No respiratory distress.     Breath sounds: Normal breath sounds. No wheezing, rhonchi or rales.  Abdominal:     General: Bowel sounds are normal.     Palpations: Abdomen is soft.  Skin:    General: Skin is warm and dry.  Neurological:     Mental Status: She is alert and oriented to person, place, and time.       11/13/2023    8:17 AM 05/08/2023    8:38 AM 11/08/2022    8:34 AM  Depression screen PHQ 2/9  Decreased Interest 1 1 0  Down, Depressed, Hopeless 0 1 0  PHQ - 2 Score 1 2 0  Altered sleeping 3 0 0  Tired, decreased energy 1 1 1   Change in appetite 1 2 0  Feeling bad or failure about yourself  0 1 0  Trouble concentrating 3 0 0  Moving slowly or fidgety/restless 0 0 0  Suicidal thoughts 0 0 0  PHQ-9 Score 9 6 1   Difficult doing work/chores Not difficult at all Not  difficult at all Not difficult at all      11/13/2023    8:17 AM 05/08/2023    8:38 AM 05/21/2022   10:54 AM 08/05/2020    1:56 PM  GAD 7 : Generalized Anxiety Score  Nervous, Anxious, on Edge 3 2 2 2   Control/stop worrying 3 2 3 2   Worry too much - different things 3 2 3 2   Trouble relaxing 1 1 1 1   Restless 0 0 1 0  Easily annoyed or irritable 1 1 3  0  Afraid - awful might happen 3 2 3 2   Total GAD 7 Score 14 10 16 9   Anxiety Difficulty  Not difficult at all Not difficult at all Not difficult at all     No results found for any visits on 12/18/23.    Assessment & Plan:  Gastroesophageal reflux disease, unspecified whether esophagitis present -Improved -Continue Protonix  40 mg daily -Patient advised to avoid symptoms known to cause problems. -For continued or worsen symptoms referral to GI.  Constipation, unspecified constipation type -Resolved -Continue MiraLAX every other day -Continue lifestyle modifications -For continued or worsening symptoms referral to GI  Essential hypertension -Controlled -Continue HCTZ 25 mg daily -Continue lifestyle modifications.  Advised exercising likely to help with constipation, anxiety, and insomnia.  GAD (generalized anxiety disorder) -self care  Return in about 3 months (around 03/17/2024) for Cholesterol recheck.   Viola Greulich, MD

## 2024-01-27 ENCOUNTER — Other Ambulatory Visit (HOSPITAL_COMMUNITY): Payer: Self-pay

## 2024-01-30 ENCOUNTER — Other Ambulatory Visit (HOSPITAL_COMMUNITY): Payer: Self-pay

## 2024-01-30 ENCOUNTER — Other Ambulatory Visit: Payer: Self-pay

## 2024-02-18 ENCOUNTER — Ambulatory Visit: Payer: Commercial Managed Care - PPO | Admitting: Nurse Practitioner

## 2024-02-27 ENCOUNTER — Encounter: Payer: Self-pay | Admitting: Nurse Practitioner

## 2024-02-27 ENCOUNTER — Ambulatory Visit (INDEPENDENT_AMBULATORY_CARE_PROVIDER_SITE_OTHER): Payer: Commercial Managed Care - PPO | Admitting: Nurse Practitioner

## 2024-02-27 VITALS — BP 120/76 | HR 76 | Ht 63.5 in | Wt 196.0 lb

## 2024-02-27 DIAGNOSIS — Z01419 Encounter for gynecological examination (general) (routine) without abnormal findings: Secondary | ICD-10-CM | POA: Diagnosis not present

## 2024-02-27 DIAGNOSIS — Z3041 Encounter for surveillance of contraceptive pills: Secondary | ICD-10-CM

## 2024-02-27 DIAGNOSIS — N951 Menopausal and female climacteric states: Secondary | ICD-10-CM

## 2024-02-27 NOTE — Progress Notes (Signed)
 Kevona Lupinacci February 24, 1981 [id number]   History:  43 y.o. [id number] presents for annual exam. Amenorrheic on POPs. Having occasional hot flashes and night sweats daily. Normal TSH in December. Had ultrasound 01/2022 that showed 2.9 cm fibroid.  Normal pap history. HTN, GERD, GAD managed by PCP.  Gynecologic History  No LMP recorded. (Menstrual status: Oral contraceptives).   Contraception/Family planning: oral progesterone-only contraceptive Sexually active: Yes  Health Maintenance Last Pap: 01/16/2023. Results were: Normal Last mammogram: 12/02/2023. Results were: Normal Last colonoscopy: Never Last Dexa: Not indicated  Past medical history, past surgical history, family history and social history were all reviewed and documented in the EPIC chart. 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 44 yo daughter at [employer]. 16 yo son, in Electronics engineer, stationed in [place]. 52 yo son. 3 yo grandson.   ROS:  A ROS was performed and pertinent positives and negatives are included.  Exam:  Vitals:   02/27/24 1433  BP: 120/76  Pulse: 76  SpO2: 98%  Weight: 196 lb (88.9 kg)  Height: 5' 3.5" (1.613 m)    Body mass index is 34.18 kg/m.  General appearance:  Normal Thyroid:  Symmetrical, normal in size, without palpable masses or nodularity. Respiratory  Auscultation:  Clear without wheezing or rhonchi Cardiovascular  Auscultation:  Regular rate, without rubs, murmurs or gallops  Edema/varicosities:  Not grossly evident Abdominal  Soft,nontender, without masses, guarding or rebound.  Liver/spleen:  No organomegaly noted  Hernia:  None appreciated  Skin  Inspection:  Grossly normal Breasts: Examined lying and sitting.   Right: Without masses, retractions, nipple discharge or axillary adenopathy.   Left: Without masses, retractions, nipple discharge or axillary adenopathy. Pelvic: External genitalia:  no lesions              Urethra:  normal appearing  urethra with no masses, tenderness or lesions              Bartholins and Skenes: normal                 Vagina: normal appearing vagina with normal color and discharge, no lesions              Cervix: no lesions Bimanual Exam:  Uterus:  no masses or tenderness              Adnexa: no mass, fullness, tenderness              Rectovaginal: Deferred              Anus:  normal, no lesions  Patient informed chaperone available to be present for breast and pelvic exam. Patient has requested no chaperone to be present. Patient has been advised what will be completed during breast and pelvic exam.   Assessment/Plan:  43 y.o. [id number] for annual exam.   Well female exam with routine gynecological exam - Education provided on SBEs, importance of preventative screenings, current guidelines, high calcium diet, regular exercise, and multivitamin daily. Labs with PCP.   Encounter for surveillance of contraceptive pills - Amenorrheic  Perimenopausal vasomotor symptoms - hot flashes, night sweats. Provided OTC supplement options. Interested in checking hormones. Stop birth control x 2 months. Return for labs if no bleeding. If menses return, restart POPs.   Screening for cervical cancer - Normal Pap history.  Will repeat at 5-year interval per guidelines.  Screening for breast cancer - Normal mammogram history.  Continue annual screenings.  Normal breast exam today.  Screening for colon cancer - Discussed current guidelines. Cologuard versus Colonoscopy discussed. Information provided on  GI.     Return in about 1 year (around 02/26/2025) for Annual.    Olivia Mackie DNP, 2:52 PM 02/27/2024

## 2024-02-27 NOTE — Patient Instructions (Signed)
 Schedule Colonoscopy! ?[facility] ?[phone] ?[address] [place], [place] [address] ? ?

## 2024-03-16 ENCOUNTER — Ambulatory Visit: Payer: Commercial Managed Care - PPO | Admitting: Family Medicine

## 2024-04-06 ENCOUNTER — Encounter

## 2024-04-10 ENCOUNTER — Other Ambulatory Visit: Payer: Self-pay | Admitting: Nurse Practitioner

## 2024-04-10 ENCOUNTER — Other Ambulatory Visit: Payer: Self-pay | Admitting: Family Medicine

## 2024-04-10 ENCOUNTER — Encounter (HOSPITAL_COMMUNITY): Payer: Self-pay

## 2024-04-10 ENCOUNTER — Other Ambulatory Visit (HOSPITAL_COMMUNITY): Payer: Self-pay

## 2024-04-10 DIAGNOSIS — K219 Gastro-esophageal reflux disease without esophagitis: Secondary | ICD-10-CM

## 2024-04-10 MED ORDER — PANTOPRAZOLE SODIUM 40 MG PO TBEC
40.0000 mg | DELAYED_RELEASE_TABLET | Freq: Every day | ORAL | 2 refills | Status: DC
  Filled 2024-04-10: qty 30, 30d supply, fill #0
  Filled 2024-04-23: qty 60, 60d supply, fill #1
  Filled 2024-05-04: qty 30, 30d supply, fill #1
  Filled 2024-06-22: qty 30, 30d supply, fill #2

## 2024-04-13 ENCOUNTER — Other Ambulatory Visit (HOSPITAL_COMMUNITY): Payer: Self-pay

## 2024-04-13 MED ORDER — NYSTATIN 100000 UNIT/GM EX CREA
1.0000 | TOPICAL_CREAM | Freq: Two times a day (BID) | CUTANEOUS | 3 refills | Status: AC
  Filled 2024-04-13: qty 30, 15d supply, fill #0
  Filled 2024-06-22: qty 30, 15d supply, fill #1
  Filled 2024-07-16: qty 30, 15d supply, fill #2

## 2024-04-15 ENCOUNTER — Encounter: Payer: Self-pay | Admitting: Family Medicine

## 2024-04-16 ENCOUNTER — Telehealth: Payer: Self-pay | Admitting: *Deleted

## 2024-04-16 DIAGNOSIS — E7841 Elevated Lipoprotein(a): Secondary | ICD-10-CM

## 2024-04-16 DIAGNOSIS — E7800 Pure hypercholesterolemia, unspecified: Secondary | ICD-10-CM

## 2024-04-16 DIAGNOSIS — E559 Vitamin D deficiency, unspecified: Secondary | ICD-10-CM

## 2024-04-16 NOTE — Telephone Encounter (Signed)
 Appt change to labs, per Dr. Arliss Lam

## 2024-04-16 NOTE — Telephone Encounter (Signed)
 Copied from CRM [id number]. Topic: General - Other >> Apr 16, 2024 12:06 PM Howard Macho wrote: Reason for CRM: patient called stating she would like to change her appointment for tomorrow to a lab visit then when the results come back she will make an appointment  CB [phone]

## 2024-04-17 ENCOUNTER — Ambulatory Visit: Admitting: Family Medicine

## 2024-04-17 ENCOUNTER — Other Ambulatory Visit

## 2024-04-17 ENCOUNTER — Other Ambulatory Visit (INDEPENDENT_AMBULATORY_CARE_PROVIDER_SITE_OTHER)

## 2024-04-17 ENCOUNTER — Ambulatory Visit: Payer: Self-pay | Admitting: Family Medicine

## 2024-04-17 DIAGNOSIS — E559 Vitamin D deficiency, unspecified: Secondary | ICD-10-CM | POA: Diagnosis not present

## 2024-04-17 DIAGNOSIS — E7841 Elevated Lipoprotein(a): Secondary | ICD-10-CM | POA: Diagnosis not present

## 2024-04-17 DIAGNOSIS — E7800 Pure hypercholesterolemia, unspecified: Secondary | ICD-10-CM | POA: Diagnosis not present

## 2024-04-17 LAB — CBC WITH DIFFERENTIAL/PLATELET
Basophils Absolute: 0 10*3/uL (ref 0.0–0.1)
Basophils Relative: 0.6 % (ref 0.0–3.0)
Eosinophils Absolute: 0.3 10*3/uL (ref 0.0–0.7)
Eosinophils Relative: 7 % — ABNORMAL HIGH (ref 0.0–5.0)
HCT: 39.8 % (ref 36.0–46.0)
Hemoglobin: 13.6 g/dL (ref 12.0–15.0)
Lymphocytes Relative: 50.7 % — ABNORMAL HIGH (ref 12.0–46.0)
Lymphs Abs: 2.4 10*3/uL (ref 0.7–4.0)
MCHC: 34.1 g/dL (ref 30.0–36.0)
MCV: 90.1 fl (ref 78.0–100.0)
Monocytes Absolute: 0.5 10*3/uL (ref 0.1–1.0)
Monocytes Relative: 10.5 % (ref 3.0–12.0)
Neutro Abs: 1.5 10*3/uL (ref 1.4–7.7)
Neutrophils Relative %: 31.2 % — ABNORMAL LOW (ref 43.0–77.0)
Platelets: 184 10*3/uL (ref 150.0–400.0)
RBC: 4.42 Mil/uL (ref 3.87–5.11)
RDW: 13.5 % (ref 11.5–15.5)
WBC: 4.8 10*3/uL (ref 4.0–10.5)

## 2024-04-17 LAB — LIPID PANEL
Cholesterol: 182 mg/dL (ref 0–200)
HDL: 56.7 mg/dL (ref >39.00)
LDL Cholesterol: 100 mg/dL — ABNORMAL HIGH (ref 0–99)
NonHDL: 125.73
Total CHOL/HDL Ratio: 3
Triglycerides: 130 mg/dL (ref 0.0–149.0)
VLDL: 26 mg/dL (ref 0.0–40.0)

## 2024-04-17 LAB — COMPREHENSIVE METABOLIC PANEL WITH GFR
ALT: 29 U/L (ref 0–35)
AST: 22 U/L (ref 0–37)
Albumin: 4.1 g/dL (ref 3.5–5.2)
Alkaline Phosphatase: 55 U/L (ref 39–117)
BUN: 18 mg/dL (ref 6–23)
CO2: 26 meq/L (ref 19–32)
Calcium: 9 mg/dL (ref 8.4–10.5)
Chloride: 103 meq/L (ref 96–112)
Creatinine, Ser: 0.89 mg/dL (ref 0.40–1.20)
GFR: 78.42 mL/min (ref >60.00)
Glucose, Bld: 98 mg/dL (ref 70–99)
Potassium: 3.7 meq/L (ref 3.5–5.1)
Sodium: 138 meq/L (ref 135–145)
Total Bilirubin: 0.5 mg/dL (ref 0.2–1.2)
Total Protein: 6.9 g/dL (ref 6.0–8.3)

## 2024-04-22 ENCOUNTER — Encounter: Admitting: Gastroenterology

## 2024-04-23 ENCOUNTER — Other Ambulatory Visit (HOSPITAL_COMMUNITY): Payer: Self-pay

## 2024-05-01 ENCOUNTER — Ambulatory Visit

## 2024-05-01 ENCOUNTER — Other Ambulatory Visit (HOSPITAL_COMMUNITY): Payer: Self-pay

## 2024-05-01 VITALS — Ht 64.0 in | Wt 195.0 lb

## 2024-05-01 DIAGNOSIS — Z1211 Encounter for screening for malignant neoplasm of colon: Secondary | ICD-10-CM

## 2024-05-01 MED ORDER — NA SULFATE-K SULFATE-MG SULF 17.5-3.13-1.6 GM/177ML PO SOLN
1.0000 | Freq: Once | ORAL | 0 refills | Status: AC
  Filled 2024-05-01: qty 354, 1d supply, fill #0

## 2024-05-01 NOTE — Progress Notes (Signed)
 No egg or soy allergy known to patient  No issues known to pt with past sedation with any surgeries or procedures Patient denies ever being told they had issues or difficulty with intubation  No FH of Malignant Hyperthermia Pt is not on diet pills Pt is not on  home 02  Pt is not on blood thinners  Pt denies issues with constipation- uses miralax 1x2 a week No A fib or A flutter Have any cardiac testing pending--NONE Pt can ambulate- independently Pt denies use of chewing tobacco Discussed diabetic I weight loss medication holds Discussed NSAID holds Checked BMI Pt instructed to use [employer] or GoodRx for a price reduction on prep  Patient's chart reviewed by Rogena Class CNRA prior to previsit and patient appropriate for the [facility].  Pre visit completed and red dot placed by patient's name on their procedure day (on provider's schedule).

## 2024-05-04 ENCOUNTER — Encounter: Payer: Self-pay | Admitting: Nurse Practitioner

## 2024-05-04 NOTE — Telephone Encounter (Signed)
 Per review of 02/27/24 AEX,  Stop birth control x 2 months. Return for labs if no bleeding.   Please advise on labs

## 2024-05-05 ENCOUNTER — Other Ambulatory Visit (HOSPITAL_COMMUNITY): Payer: Self-pay

## 2024-05-05 ENCOUNTER — Telehealth: Payer: Self-pay | Admitting: Gastroenterology

## 2024-05-05 ENCOUNTER — Other Ambulatory Visit: Payer: Self-pay

## 2024-05-05 DIAGNOSIS — Z1211 Encounter for screening for malignant neoplasm of colon: Secondary | ICD-10-CM

## 2024-05-05 MED ORDER — POLYETHYLENE GLYCOL 3350 17 GM/SCOOP PO POWD
119.0000 g | Freq: Every day | ORAL | 3 refills | Status: DC
  Filled 2024-05-05: qty 238, 5d supply, fill #0
  Filled 2024-06-04: qty 238, 5d supply, fill #1
  Filled 2024-07-16: qty 238, 5d supply, fill #2
  Filled 2024-08-15: qty 238, 5d supply, fill #3

## 2024-05-05 NOTE — Telephone Encounter (Signed)
 Instructed to take  Miralax and a stool softener up to 7 days prior to procvedure but no RX was in pharmacy. Explained that these meds are not prescription and can be found on the laxative isle.  Instructed her that it is OK to buy the pharmacy brand to use since it would be cheaper. Np other questions at this time.

## 2024-05-05 NOTE — Telephone Encounter (Signed)
 PT was advised to start Miralax. She was told a nurse would call her some in and it wasn't. Please advise.

## 2024-05-06 ENCOUNTER — Other Ambulatory Visit (HOSPITAL_COMMUNITY): Payer: Self-pay

## 2024-05-06 ENCOUNTER — Encounter: Payer: Self-pay | Admitting: Gastroenterology

## 2024-05-14 ENCOUNTER — Other Ambulatory Visit: Payer: Self-pay | Admitting: Medical Genetics

## 2024-05-18 ENCOUNTER — Emergency Department (HOSPITAL_BASED_OUTPATIENT_CLINIC_OR_DEPARTMENT_OTHER)

## 2024-05-18 ENCOUNTER — Other Ambulatory Visit: Payer: Self-pay

## 2024-05-18 ENCOUNTER — Observation Stay (HOSPITAL_BASED_OUTPATIENT_CLINIC_OR_DEPARTMENT_OTHER)
Admission: EM | Admit: 2024-05-18 | Discharge: 2024-05-19 | Disposition: A | Discharge status: Home / Self Care | Attending: Emergency Medicine | Admitting: Emergency Medicine

## 2024-05-18 ENCOUNTER — Encounter (HOSPITAL_BASED_OUTPATIENT_CLINIC_OR_DEPARTMENT_OTHER): Payer: Self-pay

## 2024-05-18 ENCOUNTER — Telehealth: Admitting: Family Medicine

## 2024-05-18 DIAGNOSIS — M793 Panniculitis, unspecified: Secondary | ICD-10-CM | POA: Diagnosis not present

## 2024-05-18 DIAGNOSIS — R935 Abnormal findings on diagnostic imaging of other abdominal regions, including retroperitoneum: Secondary | ICD-10-CM | POA: Diagnosis not present

## 2024-05-18 DIAGNOSIS — Z791 Long term (current) use of non-steroidal anti-inflammatories (NSAID): Secondary | ICD-10-CM | POA: Diagnosis not present

## 2024-05-18 DIAGNOSIS — K352 Acute appendicitis with generalized peritonitis, without perforation or abscess: Principal | ICD-10-CM

## 2024-05-18 DIAGNOSIS — K649 Unspecified hemorrhoids: Secondary | ICD-10-CM | POA: Diagnosis not present

## 2024-05-18 DIAGNOSIS — Z79899 Other long term (current) drug therapy: Secondary | ICD-10-CM | POA: Insufficient documentation

## 2024-05-18 DIAGNOSIS — K3580 Unspecified acute appendicitis: Secondary | ICD-10-CM | POA: Diagnosis not present

## 2024-05-18 DIAGNOSIS — F1090 Alcohol use, unspecified, uncomplicated: Secondary | ICD-10-CM | POA: Diagnosis not present

## 2024-05-18 DIAGNOSIS — Z880 Allergy status to penicillin: Secondary | ICD-10-CM

## 2024-05-18 DIAGNOSIS — K353 Acute appendicitis with localized peritonitis, without perforation or gangrene: Secondary | ICD-10-CM | POA: Diagnosis not present

## 2024-05-18 DIAGNOSIS — G4733 Obstructive sleep apnea (adult) (pediatric): Secondary | ICD-10-CM | POA: Diagnosis not present

## 2024-05-18 DIAGNOSIS — Z8249 Family history of ischemic heart disease and other diseases of the circulatory system: Secondary | ICD-10-CM

## 2024-05-18 DIAGNOSIS — R002 Palpitations: Secondary | ICD-10-CM | POA: Diagnosis not present

## 2024-05-18 DIAGNOSIS — F419 Anxiety disorder, unspecified: Secondary | ICD-10-CM | POA: Diagnosis present

## 2024-05-18 DIAGNOSIS — R112 Nausea with vomiting, unspecified: Secondary | ICD-10-CM | POA: Diagnosis not present

## 2024-05-18 DIAGNOSIS — Z91040 Latex allergy status: Secondary | ICD-10-CM | POA: Insufficient documentation

## 2024-05-18 DIAGNOSIS — R1013 Epigastric pain: Secondary | ICD-10-CM

## 2024-05-18 DIAGNOSIS — R1084 Generalized abdominal pain: Secondary | ICD-10-CM | POA: Diagnosis present

## 2024-05-18 DIAGNOSIS — R109 Unspecified abdominal pain: Secondary | ICD-10-CM | POA: Diagnosis not present

## 2024-05-18 DIAGNOSIS — I10 Essential (primary) hypertension: Secondary | ICD-10-CM | POA: Insufficient documentation

## 2024-05-18 LAB — COMPREHENSIVE METABOLIC PANEL WITH GFR
ALT: 20 U/L (ref 0–44)
AST: 19 U/L (ref 15–41)
Albumin: 4.7 g/dL (ref 3.5–5.0)
Alkaline Phosphatase: 83 U/L (ref 38–126)
Anion gap: 18 — ABNORMAL HIGH (ref 5–15)
BUN: 14 mg/dL (ref 6–20)
CO2: 23 mmol/L (ref 22–32)
Calcium: 10.5 mg/dL — ABNORMAL HIGH (ref 8.9–10.3)
Chloride: 95 mmol/L — ABNORMAL LOW (ref 98–111)
Creatinine, Ser: 0.94 mg/dL (ref 0.44–1.00)
GFR, Estimated: >60 mL/min (ref >60)
Glucose, Bld: 118 mg/dL — ABNORMAL HIGH (ref 70–99)
Potassium: 3.8 mmol/L (ref 3.5–5.1)
Sodium: 136 mmol/L (ref 135–145)
Total Bilirubin: 0.6 mg/dL (ref 0.0–1.2)
Total Protein: 8.5 g/dL — ABNORMAL HIGH (ref 6.5–8.1)

## 2024-05-18 LAB — URINALYSIS, ROUTINE W REFLEX MICROSCOPIC
Bilirubin Urine: NEGATIVE
Glucose, UA: NEGATIVE mg/dL
Hgb urine dipstick: NEGATIVE
Ketones, ur: 15 mg/dL — AB
Nitrite: NEGATIVE
Protein, ur: NEGATIVE mg/dL
Specific Gravity, Urine: 1.016 (ref 1.005–1.030)
pH: 6 (ref 5.0–8.0)

## 2024-05-18 LAB — CBC
HCT: 41.5 % (ref 36.0–46.0)
Hemoglobin: 14.8 g/dL (ref 12.0–15.0)
MCH: 31.2 pg (ref 26.0–34.0)
MCHC: 35.7 g/dL (ref 30.0–36.0)
MCV: 87.4 fL (ref 80.0–100.0)
Platelets: 210 10*3/uL (ref 150–400)
RBC: 4.75 MIL/uL (ref 3.87–5.11)
RDW: 12.5 % (ref 11.5–15.5)
WBC: 12.5 10*3/uL — ABNORMAL HIGH (ref 4.0–10.5)
nRBC: 0 % (ref 0.0–0.2)

## 2024-05-18 LAB — HCG, SERUM, QUALITATIVE: Preg, Serum: NEGATIVE

## 2024-05-18 LAB — LIPASE, BLOOD: Lipase: 182 U/L — ABNORMAL HIGH (ref 11–51)

## 2024-05-18 MED ORDER — ONDANSETRON HCL 4 MG/2ML IJ SOLN
4.0000 mg | Freq: Once | INTRAMUSCULAR | Status: AC
  Administered 2024-05-18: 4 mg via INTRAVENOUS
  Filled 2024-05-18: qty 2

## 2024-05-18 MED ORDER — FENTANYL CITRATE PF 50 MCG/ML IJ SOSY
50.0000 ug | PREFILLED_SYRINGE | Freq: Once | INTRAMUSCULAR | Status: AC
  Administered 2024-05-18: 50 ug via INTRAVENOUS
  Filled 2024-05-18: qty 1

## 2024-05-18 MED ORDER — OXYCODONE HCL 5 MG PO TABS
5.0000 mg | ORAL_TABLET | ORAL | Status: DC | PRN
  Administered 2024-05-18 – 2024-05-19 (×3): 5 mg via ORAL
  Filled 2024-05-18: qty 2
  Filled 2024-05-18 (×2): qty 1

## 2024-05-18 MED ORDER — METRONIDAZOLE 500 MG/100ML IV SOLN
500.0000 mg | Freq: Two times a day (BID) | INTRAVENOUS | Status: DC
  Administered 2024-05-19: 500 mg via INTRAVENOUS
  Filled 2024-05-18: qty 100

## 2024-05-18 MED ORDER — IOHEXOL 300 MG/ML  SOLN
100.0000 mL | Freq: Once | INTRAMUSCULAR | Status: AC | PRN
  Administered 2024-05-18: 100 mL via INTRAVENOUS

## 2024-05-18 MED ORDER — MORPHINE SULFATE (PF) 4 MG/ML IV SOLN: 4.0000 mg | INTRAVENOUS | Status: DC | PRN

## 2024-05-18 MED ORDER — ACETAMINOPHEN 325 MG PO TABS: 650.0000 mg | ORAL_TABLET | Freq: Four times a day (QID) | ORAL | Status: DC | PRN

## 2024-05-18 MED ORDER — ACETAMINOPHEN 650 MG RE SUPP: 650.0000 mg | Freq: Four times a day (QID) | RECTAL | Status: DC | PRN

## 2024-05-18 MED ORDER — HYDROCHLOROTHIAZIDE 25 MG PO TABS: 25.0000 mg | ORAL_TABLET | Freq: Every day | ORAL | Status: DC

## 2024-05-18 MED ORDER — MIDAZOLAM HCL 2 MG/2ML IJ SOLN
0.5000 mg | Freq: Once | INTRAMUSCULAR | Status: AC
  Administered 2024-05-18: 0.5 mg via INTRAVENOUS
  Filled 2024-05-18: qty 2

## 2024-05-18 MED ORDER — ONDANSETRON HCL 4 MG/2ML IJ SOLN: 4.0000 mg | Freq: Four times a day (QID) | INTRAMUSCULAR | Status: DC | PRN

## 2024-05-18 MED ORDER — LACTATED RINGERS IV BOLUS
1000.0000 mL | Freq: Once | INTRAVENOUS | Status: AC
  Administered 2024-05-18: 1000 mL via INTRAVENOUS

## 2024-05-18 MED ORDER — HYDROXYZINE HCL 10 MG PO TABS: 10.0000 mg | ORAL_TABLET | Freq: Three times a day (TID) | ORAL | Status: DC | PRN

## 2024-05-18 MED ORDER — SODIUM CHLORIDE 0.9 % IV SOLN
2.0000 g | Freq: Two times a day (BID) | INTRAVENOUS | Status: DC
  Administered 2024-05-18: 2 g via INTRAVENOUS
  Filled 2024-05-18: qty 12.5

## 2024-05-18 MED ORDER — HYDRALAZINE HCL 20 MG/ML IJ SOLN: 10.0000 mg | INTRAMUSCULAR | Status: DC | PRN

## 2024-05-18 MED ORDER — SODIUM CHLORIDE 0.9 % IV SOLN
2.0000 g | INTRAVENOUS | Status: DC
  Administered 2024-05-19: 2 g via INTRAVENOUS
  Filled 2024-05-18: qty 20

## 2024-05-18 MED ORDER — DIPHENHYDRAMINE HCL 50 MG/ML IJ SOLN
25.0000 mg | Freq: Once | INTRAMUSCULAR | Status: AC
  Administered 2024-05-18: 25 mg via INTRAVENOUS
  Filled 2024-05-18: qty 1

## 2024-05-18 MED ORDER — LACTATED RINGERS IV SOLN: INTRAVENOUS | Status: DC

## 2024-05-18 MED ORDER — METRONIDAZOLE 500 MG/100ML IV SOLN
500.0000 mg | Freq: Three times a day (TID) | INTRAVENOUS | Status: DC
  Administered 2024-05-18: 500 mg via INTRAVENOUS
  Filled 2024-05-18: qty 100

## 2024-05-18 MED ORDER — ONDANSETRON 4 MG PO TBDP
4.0000 mg | ORAL_TABLET | Freq: Four times a day (QID) | ORAL | Status: DC | PRN
  Filled 2024-05-18: qty 1

## 2024-05-18 NOTE — ED Notes (Signed)
 [facility] at bedside. Pt information card given to pt.

## 2024-05-18 NOTE — ED Triage Notes (Signed)
 Pt c/o abd pain/ burning, vomiting all night. States it started burning Saturday, pantoprazole  didn't do anything but didn't make it worse. Virtual visit by PCP just PTA, advised to come to ED for futher workup.

## 2024-05-18 NOTE — ED Notes (Signed)
 Report given to Emergency planning/management officer at [facility].  Awaiting [facility] for transport.

## 2024-05-18 NOTE — Progress Notes (Signed)
 Virtual Visit Consent   Ashley Summers, you are scheduled for a virtual visit with a [facility] provider today. Just as with appointments in the office, your consent must be obtained to participate. Your consent will be active for this visit and any virtual visit you may have with one of our providers in the next 365 days. If you have a MyChart account, a copy of this consent can be sent to you electronically.  As this is a virtual visit, video technology does not allow for your provider to perform a traditional examination. This may limit your provider's ability to fully assess your condition. If your provider identifies any concerns that need to be evaluated in person or the need to arrange testing (such as labs, EKG, etc.), we will make arrangements to do so. Although advances in technology are sophisticated, we cannot ensure that it will always work on either your end or our end. If the connection with a video visit is poor, the visit may have to be switched to a telephone visit. With either a video or telephone visit, we are not always able to ensure that we have a secure connection.  By engaging in this virtual visit, you consent to the provision of healthcare and authorize for your insurance to be billed (if applicable) for the services provided during this visit. Depending on your insurance coverage, you may receive a charge related to this service.  I need to obtain your verbal consent now. Are you willing to proceed with your visit today? [name] has provided verbal consent on 05/18/2024 for a virtual visit (video or telephone). Lanetta Pion, NP  Date: 05/18/2024 1:47 PM   Virtual Visit via Video Note   I, Lanetta Pion, connected with  Ashley Summers  ([id number], 08-27-1981) on 05/18/24 at  1:45 PM EDT by a video-enabled telemedicine application and verified that I am speaking with the correct person using two identifiers.  Location: Patient: Virtual Visit  Location Patient: Home Provider: Virtual Visit Location Provider: Home Office   I discussed the limitations of evaluation and management by telemedicine and the availability of in person appointments. The patient expressed understanding and agreed to proceed.    History of Present Illness: Ashley Summers is a 43 y.o. who identifies as a female who was assigned female at birth, and is being seen today for stomach pain  Onset was Saturday starting with burning sensation. Progressing into Sunday with ongoing burning, reduced appetite and progression of pain all night debating whether or not she needs to go in to be seen.  She has started to have nausea and vomiting.  Unable to keep anything down.  Additionally reports she has not had a bowel movement or gas in over 24 hours. Pain level is between a 7-10/10.  Pain is located epigastric/ bellybutton area with reported it radiation at times -left and right, with soreness radiating up under her rib cage. Modifying factors included trying Tums, tried a second dose of Protonix , tried Pepto.  Limited relief. Denies fevers, but feels like chills at times and hot flashes. Denies having active chest pain, shortness of breath, back pain, shoulder pain, lower abdominal pain, blood in emesis   Problems:  Patient Active Problem List   Diagnosis Date Noted   Panniculitis 09/20/2020   Essential hypertension 09/09/2020   Palpitations 09/09/2020   OSA (obstructive sleep apnea) 04/23/2018   Hemorrhoid 02/03/2016    Allergies:  Allergies  Allergen Reactions   Latex  Rash   Penicillins Rash   Medications:  Current Outpatient Medications:    Biotin 1000 MCG tablet, Take 1,000 mcg by mouth daily., Disp: , Rfl:    cetirizine  (ZYRTEC ) 10 MG tablet, Take 1 tablet (10 mg total) by mouth daily., Disp: 90 tablet, Rfl: 3   cholecalciferol (VITAMIN D3) 25 MCG (1000 UNIT) tablet, Take 2,000 Units by mouth daily., Disp: , Rfl:    docusate sodium (COLACE) 100  MG capsule, Take 100 mg by mouth daily as needed for mild constipation. as needed, Disp: , Rfl:    Drospirenone  (SLYND ) 4 MG TABS, Take 1 tablet (4 mg total) by mouth daily. (Patient not taking: Reported on 05/01/2024), Disp: 84 tablet, Rfl: 3   hydrochlorothiazide  (HYDRODIURIL ) 25 MG tablet, Take 1 tablet (25 mg total) by mouth daily., Disp: 90 tablet, Rfl: 2   hydrOXYzine  (ATARAX ) 10 MG tablet, Take 1 tablet (10 mg) by mouth 3 times daily as needed for anxiety., Disp: 90 tablet, Rfl: 6   ibuprofen  (ADVIL ) 200 MG tablet, Take by mouth., Disp: , Rfl:    Multiple Vitamins-Minerals (CERTAVITE/ANTIOXIDANTS) TABS, Take 1 tablet by mouth daily., Disp: 90 tablet, Rfl: 3   nystatin  cream (MYCOSTATIN ), Apply 1 application topically 2 (two) times daily under breast, Disp: 30 g, Rfl: 3   pantoprazole  (PROTONIX ) 40 MG tablet, Take 1 tablet (40 mg total) by mouth daily before breakfast., Disp: 30 tablet, Rfl: 2   polyethylene glycol powder (GLYCOLAX /MIRALAX ) 17 GM/SCOOP powder, Take 17 g by mouth daily for 5 days prior to colonoscopy., Disp: 238 g, Rfl: 3  Observations/Objective: Patient is well-developed, well-nourished in no acute distress.  Resting comfortably  at home.  Head is normocephalic, atraumatic.  No labored breathing.  Speech is clear and coherent with logical content.  Patient is alert and oriented at baseline.    Assessment and Plan:  1. Epigastric abdominal pain (Primary)   2. Nausea and vomiting, unspecified vomiting type  -Given symptoms, progression, induration, pain level and inability to calm symptoms with OTC advised that she needs to go to the emergency room for labs and possible CT scan workup for possible differential diagnoses of concern for gallbladder issue, pancreatitis, small bowel obstruction or similar.  - Verbalized understanding will go to ED now   Follow Up Instructions: I discussed the assessment and treatment plan with the patient. The patient was provided an  opportunity to ask questions and all were answered. The patient agreed with the plan and demonstrated an understanding of the instructions.  A copy of instructions were sent to the patient via MyChart unless otherwise noted below.     The patient was advised to call back or seek an in-person evaluation if the symptoms worsen or if the condition fails to improve as anticipated.    Lanetta Pion, NP

## 2024-05-18 NOTE — ED Provider Notes (Signed)
 [facility] AT [facility] Provider Note   CSN: [id number] Arrival date & time: 05/18/24  1437     Patient presents with: Abdominal Pain   Ashley Summers is a 43 y.o. female with history of hypertension, presents with concern for generalized abdominal pain that started last night.  States this was fairly sudden in onset.  She also reports inability to keep any food down.  Last bowel movement was yesterday.  Denies any fever or chills.  Does report drinking alcohol over the weekend prior to this starting.    Abdominal Pain      Prior to Admission medications   Medication Sig Start Date End Date Taking? Authorizing Provider  Biotin 1000 MCG tablet Take 1,000 mcg by mouth daily.    [provider]  cetirizine  (ZYRTEC ) 10 MG tablet Take 1 tablet (10 mg total) by mouth daily. 09/05/23   Viola Greulich, MD  cholecalciferol (VITAMIN D3) 25 MCG (1000 UNIT) tablet Take 2,000 Units by mouth daily.    [provider]  docusate sodium (COLACE) 100 MG capsule Take 100 mg by mouth daily as needed for mild constipation. as needed    [provider]  Drospirenone  (SLYND ) 4 MG TABS Take 1 tablet (4 mg total) by mouth daily. Patient not taking: Reported on 05/01/2024 03/27/23   Antonio Klinefelter A, NP  hydrochlorothiazide  (HYDRODIURIL ) 25 MG tablet Take 1 tablet (25 mg total) by mouth daily. 12/02/23   Viola Greulich, MD  hydrOXYzine  (ATARAX ) 10 MG tablet Take 1 tablet (10 mg) by mouth 3 times daily as needed for anxiety. 05/08/23   Viola Greulich, MD  ibuprofen  (ADVIL ) 200 MG tablet Take by mouth. 07/10/17   [provider]  Multiple Vitamins-Minerals (CERTAVITE/ANTIOXIDANTS) TABS Take 1 tablet by mouth daily. 09/05/23   Viola Greulich, MD  nystatin  cream (MYCOSTATIN ) Apply 1 application topically 2 (two) times daily under breast 04/13/24   Susanna Epley, FNP  pantoprazole  (PROTONIX ) 40 MG tablet Take 1 tablet (40 mg total) by  mouth daily before breakfast. 04/10/24   Viola Greulich, MD  polyethylene glycol powder (GLYCOLAX /MIRALAX ) 17 GM/SCOOP powder Take 17 g by mouth daily for 5 days prior to colonoscopy. 05/05/24   Mansouraty, Albino Alu., MD    Allergies: Latex and Penicillins    Review of Systems  Gastrointestinal:  Positive for abdominal pain.    Updated Vital Signs BP (!) 168/93 (BP Location: Right Arm)   Pulse 61   Temp 99 F (37.2 C) (Oral)   Resp 16   SpO2 100%   Physical Exam Vitals and nursing note reviewed.  Constitutional:      General: She is not in acute distress.    Appearance: She is well-developed.     Comments: No active vomiting  HENT:     Head: Normocephalic and atraumatic.   Eyes:     Conjunctiva/sclera: Conjunctivae normal.    Cardiovascular:     Rate and Rhythm: Normal rate and regular rhythm.     Heart sounds: No murmur heard. Pulmonary:     Effort: Pulmonary effort is normal. No respiratory distress.     Breath sounds: Normal breath sounds.  Abdominal:     Palpations: Abdomen is soft.     Tenderness: There is abdominal tenderness.     Comments: Tenderness and guarding to the right upper quadrant and right lower quadrant.   Musculoskeletal:        General: No swelling.  Cervical back: Neck supple.   Skin:    General: Skin is warm and dry.     Capillary Refill: Capillary refill takes less than 2 seconds.   Neurological:     Mental Status: She is alert.   Psychiatric:        Mood and Affect: Mood normal.     (all labs ordered are listed, but only abnormal results are displayed) Labs Reviewed  LIPASE, BLOOD - Abnormal; Notable for the following components:      Result Value   Lipase 182 (*)    All other components within normal limits  COMPREHENSIVE METABOLIC PANEL WITH GFR - Abnormal; Notable for the following components:   Chloride 95 (*)    Glucose, Bld 118 (*)    Calcium 10.5 (*)    Total Protein 8.5 (*)    Anion gap 18 (*)    All other  components within normal limits  CBC - Abnormal; Notable for the following components:   WBC 12.5 (*)    All other components within normal limits  HCG, SERUM, QUALITATIVE  URINALYSIS, ROUTINE W REFLEX MICROSCOPIC    EKG: None  Radiology: CT ABDOMEN PELVIS W CONTRAST Result Date: 05/18/2024 CLINICAL DATA:  Abdominal pain. EXAM: CT ABDOMEN AND PELVIS WITH CONTRAST TECHNIQUE: Multidetector CT imaging of the abdomen and pelvis was performed using the standard protocol following bolus administration of intravenous contrast. RADIATION DOSE REDUCTION: This exam was performed according to the departmental dose-optimization program which includes automated exposure control, adjustment of the mA and/or kV according to patient size and/or use of iterative reconstruction technique. CONTRAST:  100mL OMNIPAQUE IOHEXOL 300 MG/ML  SOLN COMPARISON:  None Available. FINDINGS: Lower chest: No acute abnormality. No pleural or pericardial effusions. Hepatobiliary: No focal liver abnormality is seen. No gallstones, gallbladder wall thickening, or biliary dilatation. Pancreas: Unremarkable. No pancreatic ductal dilatation or surrounding inflammatory changes. Spleen: Normal in size without focal abnormality. Adrenals/Urinary Tract: Adrenal glands are unremarkable. Kidneys are normal, without renal calculi, focal lesion, or hydronephrosis. Bladder is unremarkable. Stomach/Bowel: No bowel dilatation to suggest obstruction. Unremarkable appearance of stomach. There is an abnormal appendix with periappendiceal fat stranding consistent with acute appendicitis. No evidence of abscess. Vascular/Lymphatic: No significant vascular findings are present. No enlarged abdominal or pelvic lymph nodes. Reproductive: Uterus and bilateral adnexa are unremarkable. Other: No abdominal wall hernia or abnormality. No abdominopelvic ascites. Musculoskeletal: No acute or significant osseous findings. IMPRESSION: Acute appendicitis.  No abscess or  obstruction. Electronically Signed   By: Sydell Eva M.D.   On: 05/18/2024 19:01     Procedures   Medications Ordered in the ED  fentaNYL (SUBLIMAZE) injection 50 mcg (has no administration in time range)  lactated ringers bolus 1,000 mL (has no administration in time range)  metroNIDAZOLE  (FLAGYL ) IVPB 500 mg (has no administration in time range)  ceFEPIme (MAXIPIME) 2 g in sodium chloride 0.9 % 100 mL IVPB (has no administration in time range)  diphenhydrAMINE (BENADRYL) injection 25 mg (has no administration in time range)  lactated ringers bolus 1,000 mL (0 mLs Intravenous Stopped 05/18/24 1812)  fentaNYL (SUBLIMAZE) injection 50 mcg (50 mcg Intravenous Given 05/18/24 1618)  ondansetron (ZOFRAN) injection 4 mg (4 mg Intravenous Given 05/18/24 1616)  iohexol (OMNIPAQUE) 300 MG/ML solution 100 mL (100 mLs Intravenous Contrast Given 05/18/24 1842)    Clinical Course as of 05/18/24 2009  Mon May 18, 2024  1936 Spoke with general surgery Dr. Hildy Lowers who will admit directly to general surgery service for further  management of appendicitis [AF]    Clinical Course User Index [AF] Rexie Catena, PA-C                                 Medical Decision Making Amount and/or Complexity of Data Reviewed Labs: ordered. Radiology: ordered.  Risk Prescription drug management. Decision regarding hospitalization.     Differential diagnosis includes but is not limited to Cholelithiasis, cholangitis, choledocholithiasis, peptic ulcer, gastritis, gastroenteritis, appendicitis, IBS, IBD, DKA, nephrolithiasis, UTI, pyelonephritis, pancreatitis, diverticulitis, mesenteric ischemia, abdominal aortic aneurysm, small bowel obstruction, volvulus, pregnancy related concerns in females of childbearing age    ED Course:  Upon initial evaluation, patient is well-appearing, no acute distress.  Stable vitals.  She does have tenderness in the right upper quadrant and right lower quadrant on exam with  mild guarding.  Labs Ordered: I Ordered, and personally interpreted labs.  The pertinent results include:   CBC with leukocytosis of 12.5 CMP without any elevation LFTs or creatinine.  Anion gap of 18 Lipase elevated at 182 Pregnancy negative  Imaging Studies ordered: I ordered imaging studies including CT abdomen pelvis I independently visualized the imaging with scope of interpretation limited to determining acute life threatening conditions related to emergency care. Imaging showed acute appendicitis.  No abscess.  Pancreas appears unremarkable, no pancreatic ductal dilation or surrounding inflammatory changes I agree with the radiologist interpretation   Consultations Obtained: I requested consultation with the general surgery Dr. Hildy Lowers,  and discussed lab and imaging findings as well as pertinent plan - they recommend: Admission with the general surgery service for further management  Medications Given: LR bolus plus maintenance fluids Fentanyl for pain Zofran for nausea Cefepime and metronidazole  for appendicitis Benadryl for potential medication reaction  Upon re-evaluation, patient remains well-appearing with stable vitals.  Patient states pain is returning slightly, will add on additional fentanyl.  Although she is being admitted primarily for appendicitis found on CT scan, her lipase will need to be trended given the elevation.  Suspect there may be component of acute pancreatitis as well.    Impression: Acute appendicitis Pancreatitis  Disposition:  Admission with general surgery Dr. Hildy Lowers for management of acute appendicitis   This chart was dictated using voice recognition software, Dragon. Despite the best efforts of this provider to proofread and correct errors, errors may still occur which can change documentation meaning.       Final diagnoses:  Acute appendicitis with generalized peritonitis without gangrene, perforation, or abscess    ED  Discharge Orders     None          Rexie Catena, PA-C 05/18/24 2009    Rafael Bun A, DO 05/19/24 [phone]

## 2024-05-18 NOTE — H&P (Signed)
 Ashley Summers is an 43 y.o. female.   Chief Complaint: Abdominal pain, nausea and vomiting HPI: 43 year old female with past medical history of hypertension developed some mild abdominal pain on Saturday.  Sunday night, it became more severe.  She had significant vomiting and was not able to keep anything down.  It was initially central but gradually moved lower in her abdomen.  [facility].  CT scan shows acute appendicitis.  She was accepted in transfer for surgical management.  Past Medical History:  Diagnosis Date   Allergy    Anxiety    Essential hypertension 09/09/2020   Hypertension    Palpitations 09/09/2020    Past Surgical History:  Procedure Laterality Date   BREAST SURGERY     Reduction    COMBINED ABDOMINOPLASTY AND LIPOSUCTION     20 23   Hammertoe correction     REDUCTION MAMMAPLASTY      Family History  Problem Relation Age of Onset   Hypertension Mother    Stroke Mother    Pulmonary embolism Mother    Hypertension Brother    Hyperlipidemia Maternal Grandmother    Hypertension Maternal Grandmother    Colon cancer Neg Hx    Esophageal cancer Neg Hx    Rectal cancer Neg Hx    Stomach cancer Neg Hx    Social History:  reports that she has never smoked. She has never used smokeless tobacco. She reports current alcohol use. She reports that she does not use drugs.  Allergies:  Allergies  Allergen Reactions   Latex Rash   Penicillins Rash    Medications Prior to Admission  Medication Sig Dispense Refill   Biotin 1000 MCG tablet Take 1,000 mcg by mouth daily.     cetirizine  (ZYRTEC ) 10 MG tablet Take 1 tablet (10 mg total) by mouth daily. 90 tablet 3   cholecalciferol (VITAMIN D3) 25 MCG (1000 UNIT) tablet Take 2,000 Units by mouth daily.     docusate sodium (COLACE) 100 MG capsule Take 100 mg by mouth daily as needed for mild constipation. as needed     Drospirenone  (SLYND ) 4 MG TABS Take 1 tablet (4 mg total) by  mouth daily. (Patient not taking: Reported on 05/01/2024) 84 tablet 3   hydrochlorothiazide  (HYDRODIURIL ) 25 MG tablet Take 1 tablet (25 mg total) by mouth daily. 90 tablet 2   hydrOXYzine  (ATARAX ) 10 MG tablet Take 1 tablet (10 mg) by mouth 3 times daily as needed for anxiety. 90 tablet 6   ibuprofen  (ADVIL ) 200 MG tablet Take by mouth.     Multiple Vitamins-Minerals (CERTAVITE/ANTIOXIDANTS) TABS Take 1 tablet by mouth daily. 90 tablet 3   nystatin  cream (MYCOSTATIN ) Apply 1 application topically 2 (two) times daily under breast 30 g 3   pantoprazole  (PROTONIX ) 40 MG tablet Take 1 tablet (40 mg total) by mouth daily before breakfast. 30 tablet 2   polyethylene glycol powder (GLYCOLAX /MIRALAX ) 17 GM/SCOOP powder Take 17 g by mouth daily for 5 days prior to colonoscopy. 238 g 3    Results for orders placed or performed during the hospital encounter of 05/18/24 (from the past 48 hours)  Urinalysis, Routine w reflex microscopic -Urine, Clean Catch     Status: Abnormal   Collection Time: 05/18/24  2:45 PM  Result Value Ref Range   Color, Urine YELLOW YELLOW   APPearance CLEAR CLEAR   Specific Gravity, Urine 1.016 1.005 - 1.030   pH 6.0 5.0 - 8.0   Glucose,  UA NEGATIVE NEGATIVE mg/dL   Hgb urine dipstick NEGATIVE NEGATIVE   Bilirubin Urine NEGATIVE NEGATIVE   Ketones, ur 15 (A) NEGATIVE mg/dL   Protein, ur NEGATIVE NEGATIVE mg/dL   Nitrite NEGATIVE NEGATIVE   Leukocytes,Ua TRACE (A) NEGATIVE    Comment: Performed at [facility], [address], [place], [place] [address]  Lipase, blood     Status: Abnormal   Collection Time: 05/18/24  2:48 PM  Result Value Ref Range   Lipase 182 (H) 11 - 51 U/L    Comment: Performed at [facility], [address] [place], [place] [address]  Comprehensive metabolic panel     Status: Abnormal   Collection Time: 05/18/24  2:48 PM  Result Value Ref Range   Sodium 136 135 - 145 mmol/L   Potassium 3.8 3.5 - 5.1 mmol/L    Chloride 95 (L) 98 - 111 mmol/L   CO2 23 22 - 32 mmol/L   Glucose, Bld 118 (H) 70 - 99 mg/dL    Comment: Glucose reference range applies only to samples taken after fasting for at least 8 hours.   BUN 14 6 - 20 mg/dL   Creatinine, Ser 0.98 0.44 - 1.00 mg/dL   Calcium 11.9 (H) 8.9 - 10.3 mg/dL   Total Protein 8.5 (H) 6.5 - 8.1 g/dL   Albumin 4.7 3.5 - 5.0 g/dL   AST 19 15 - 41 U/L   ALT 20 0 - 44 U/L   Alkaline Phosphatase 83 38 - 126 U/L   Total Bilirubin 0.6 0.0 - 1.2 mg/dL   GFR, Estimated >14 >78 mL/min    Comment: (NOTE) Calculated using the CKD-EPI Creatinine Equation (2021)    Anion gap 18 (H) 5 - 15    Comment: Performed at [facility], [address], [place], [place] [address]  CBC     Status: Abnormal   Collection Time: 05/18/24  2:48 PM  Result Value Ref Range   WBC 12.5 (H) 4.0 - 10.5 K/uL   RBC 4.75 3.87 - 5.11 MIL/uL   Hemoglobin 14.8 12.0 - 15.0 g/dL   HCT 13.0 86.5 - 78.4 %   MCV 87.4 80.0 - 100.0 fL   MCH 31.2 26.0 - 34.0 pg   MCHC 35.7 30.0 - 36.0 g/dL   RDW 69.6 29.5 - 28.4 %   Platelets 210 150 - 400 K/uL   nRBC 0.0 0.0 - 0.2 %    Comment: Performed at [facility], [address], [place], [place] [address]  hCG, serum, qualitative     Status: None   Collection Time: 05/18/24  4:49 PM  Result Value Ref Range   Preg, Serum NEGATIVE NEGATIVE    Comment:        THE SENSITIVITY OF THIS METHODOLOGY IS >10 mIU/mL. Performed at [facility], [address], [place], [place] [address]    CT ABDOMEN PELVIS W CONTRAST Result Date: 05/18/2024 CLINICAL DATA:  Abdominal pain. EXAM: CT ABDOMEN AND PELVIS WITH CONTRAST TECHNIQUE: Multidetector CT imaging of the abdomen and pelvis was performed using the standard protocol following bolus administration of intravenous contrast. RADIATION DOSE REDUCTION: This exam was performed according to the departmental dose-optimization program which includes automated  exposure control, adjustment of the mA and/or kV according to patient size and/or use of iterative reconstruction technique. CONTRAST:  100mL OMNIPAQUE IOHEXOL 300 MG/ML  SOLN COMPARISON:  None Available. FINDINGS: Lower chest: No acute abnormality. No pleural or pericardial effusions. Hepatobiliary: No focal liver  abnormality is seen. No gallstones, gallbladder wall thickening, or biliary dilatation. Pancreas: Unremarkable. No pancreatic ductal dilatation or surrounding inflammatory changes. Spleen: Normal in size without focal abnormality. Adrenals/Urinary Tract: Adrenal glands are unremarkable. Kidneys are normal, without renal calculi, focal lesion, or hydronephrosis. Bladder is unremarkable. Stomach/Bowel: No bowel dilatation to suggest obstruction. Unremarkable appearance of stomach. There is an abnormal appendix with periappendiceal fat stranding consistent with acute appendicitis. No evidence of abscess. Vascular/Lymphatic: No significant vascular findings are present. No enlarged abdominal or pelvic lymph nodes. Reproductive: Uterus and bilateral adnexa are unremarkable. Other: No abdominal wall hernia or abnormality. No abdominopelvic ascites. Musculoskeletal: No acute or significant osseous findings. IMPRESSION: Acute appendicitis.  No abscess or obstruction. Electronically Signed   By: Sydell Eva M.D.   On: 05/18/2024 19:01    Review of Systems  Constitutional:  Positive for appetite change.  HENT: Negative.    Eyes: Negative.   Respiratory: Negative.    Cardiovascular: Negative.   Gastrointestinal:  Positive for abdominal pain, nausea and vomiting.  Endocrine: Negative.   Genitourinary: Negative.   Musculoskeletal: Negative.   Allergic/Immunologic: Negative.   Neurological: Negative.   Hematological: Negative.   Psychiatric/Behavioral: Negative.      Blood pressure (!) 142/105, pulse 76, temperature 98.4 F (36.9 C), temperature source Oral, resp. rate 18, height 5' 4 (1.626  m), weight 86.2 kg, SpO2 100%. Physical Exam HENT:     Head: Normocephalic.   Cardiovascular:     Rate and Rhythm: Normal rate and regular rhythm.     Heart sounds: Normal heart sounds.  Pulmonary:     Effort: Pulmonary effort is normal.     Breath sounds: No wheezing.  Abdominal:     General: Abdomen is flat.     Palpations: Abdomen is soft.     Tenderness: There is abdominal tenderness in the right lower quadrant and periumbilical area. There is no guarding or rebound.   Skin:    General: Skin is warm.     Capillary Refill: Capillary refill takes less than 2 seconds.   Neurological:     Mental Status: She is alert and oriented to person, place, and time.   Psychiatric:        Mood and Affect: Mood normal.      Assessment/Plan Acute appendicitis -IV antibiotics, will admit and plan laparoscopic appendectomy tomorrow morning by Dr. Delane Fear.  I discussed the procedure, risks, and benefits in detail with her.  I discussed the expected postoperative course and answered her questions.  She is agreeable.  Cloyce Darby, MD 05/18/2024, 10:13 PM

## 2024-05-18 NOTE — ED Notes (Signed)
 Pt out of ED with cellphone, additional belonging sent home with significant other. IVF and IV abx infusing. Medicated per emar for anxiety. All paperwork sent with pt.

## 2024-05-18 NOTE — ED Notes (Signed)
 Pt aware of the need for a urine... Unable to currently provide the sample.Marland KitchenMarland Kitchen

## 2024-05-19 ENCOUNTER — Other Ambulatory Visit (HOSPITAL_COMMUNITY): Payer: Self-pay

## 2024-05-19 ENCOUNTER — Other Ambulatory Visit: Payer: Self-pay

## 2024-05-19 ENCOUNTER — Encounter (HOSPITAL_COMMUNITY): Payer: Self-pay | Admitting: General Surgery

## 2024-05-19 ENCOUNTER — Inpatient Hospital Stay (HOSPITAL_COMMUNITY): Admitting: Anesthesiology

## 2024-05-19 ENCOUNTER — Encounter (HOSPITAL_COMMUNITY)
Admission: EM | Disposition: A | Discharge status: Home / Self Care | Payer: Self-pay | Source: Home / Self Care | Attending: Emergency Medicine

## 2024-05-19 DIAGNOSIS — K353 Acute appendicitis with localized peritonitis, without perforation or gangrene: Secondary | ICD-10-CM | POA: Diagnosis not present

## 2024-05-19 DIAGNOSIS — K3532 Acute appendicitis with perforation and localized peritonitis, without abscess: Secondary | ICD-10-CM | POA: Diagnosis not present

## 2024-05-19 DIAGNOSIS — K3580 Unspecified acute appendicitis: Secondary | ICD-10-CM

## 2024-05-19 HISTORY — PX: LAPAROSCOPIC APPENDECTOMY: SHX408

## 2024-05-19 LAB — BASIC METABOLIC PANEL WITH GFR
Anion gap: 8 (ref 5–15)
BUN: 11 mg/dL (ref 6–20)
CO2: 25 mmol/L (ref 22–32)
Calcium: 8.7 mg/dL — ABNORMAL LOW (ref 8.9–10.3)
Chloride: 104 mmol/L (ref 98–111)
Creatinine, Ser: 1.07 mg/dL — ABNORMAL HIGH (ref 0.44–1.00)
GFR, Estimated: >60 mL/min (ref >60)
Glucose, Bld: 110 mg/dL — ABNORMAL HIGH (ref 70–99)
Potassium: 3.9 mmol/L (ref 3.5–5.1)
Sodium: 137 mmol/L (ref 135–145)

## 2024-05-19 LAB — CBC
HCT: 37.6 % (ref 36.0–46.0)
Hemoglobin: 13 g/dL (ref 12.0–15.0)
MCH: 31.2 pg (ref 26.0–34.0)
MCHC: 34.6 g/dL (ref 30.0–36.0)
MCV: 90.2 fL (ref 80.0–100.0)
Platelets: 168 10*3/uL (ref 150–400)
RBC: 4.17 MIL/uL (ref 3.87–5.11)
RDW: 12.7 % (ref 11.5–15.5)
WBC: 10.6 10*3/uL — ABNORMAL HIGH (ref 4.0–10.5)
nRBC: 0 % (ref 0.0–0.2)

## 2024-05-19 LAB — GLUCOSE, CAPILLARY: Glucose-Capillary: 99 mg/dL (ref 70–99)

## 2024-05-19 LAB — HIV ANTIBODY (ROUTINE TESTING W REFLEX): HIV Screen 4th Generation wRfx: NONREACTIVE

## 2024-05-19 SURGERY — APPENDECTOMY, LAPAROSCOPIC
Anesthesia: General | Site: Abdomen

## 2024-05-19 MED ORDER — CHLORHEXIDINE GLUCONATE 0.12 % MT SOLN
OROMUCOSAL | Status: AC
  Administered 2024-05-19: 15 mL via OROMUCOSAL
  Filled 2024-05-19: qty 15

## 2024-05-19 MED ORDER — HYDROMORPHONE HCL 1 MG/ML IJ SOLN
INTRAMUSCULAR | Status: DC | PRN
  Administered 2024-05-19: .5 mg via INTRAVENOUS

## 2024-05-19 MED ORDER — LACTATED RINGERS IV SOLN: INTRAVENOUS | Status: DC

## 2024-05-19 MED ORDER — LIDOCAINE 2% (20 MG/ML) 5 ML SYRINGE
INTRAMUSCULAR | Status: DC | PRN
  Administered 2024-05-19: 80 mg via INTRAVENOUS

## 2024-05-19 MED ORDER — ONDANSETRON HCL 4 MG/2ML IJ SOLN
INTRAMUSCULAR | Status: DC | PRN
  Administered 2024-05-19: 4 mg via INTRAVENOUS

## 2024-05-19 MED ORDER — STERILE WATER FOR IRRIGATION IR SOLN
Status: DC | PRN
  Administered 2024-05-19: 1000 mL

## 2024-05-19 MED ORDER — CHLORHEXIDINE GLUCONATE 0.12 % MT SOLN: 15.0000 mL | Freq: Once | OROMUCOSAL | Status: AC

## 2024-05-19 MED ORDER — OXYCODONE HCL 5 MG PO TABS: 5.0000 mg | ORAL_TABLET | Freq: Once | ORAL | Status: DC | PRN

## 2024-05-19 MED ORDER — SCOPOLAMINE 1 MG/3DAYS TD PT72
1.0000 | MEDICATED_PATCH | TRANSDERMAL | Status: DC
  Administered 2024-05-19: 1.5 mg via TRANSDERMAL

## 2024-05-19 MED ORDER — SUGAMMADEX SODIUM 200 MG/2ML IV SOLN
INTRAVENOUS | Status: DC | PRN
  Administered 2024-05-19: 400 mg via INTRAVENOUS

## 2024-05-19 MED ORDER — 0.9 % SODIUM CHLORIDE (POUR BTL) OPTIME
TOPICAL | Status: DC | PRN
  Administered 2024-05-19: 1000 mL

## 2024-05-19 MED ORDER — OXYCODONE HCL 5 MG PO TABS
5.0000 mg | ORAL_TABLET | Freq: Four times a day (QID) | ORAL | 0 refills | Status: DC | PRN
  Filled 2024-05-19: qty 12, 2d supply, fill #0

## 2024-05-19 MED ORDER — AMISULPRIDE (ANTIEMETIC) 5 MG/2ML IV SOLN: 10.0000 mg | Freq: Once | INTRAVENOUS | Status: DC | PRN

## 2024-05-19 MED ORDER — ACETAMINOPHEN 500 MG PO TABS: 1000.0000 mg | ORAL_TABLET | Freq: Once | ORAL | Status: DC

## 2024-05-19 MED ORDER — PROPOFOL 10 MG/ML IV BOLUS
INTRAVENOUS | Status: AC
  Filled 2024-05-19: qty 20

## 2024-05-19 MED ORDER — FENTANYL CITRATE (PF) 100 MCG/2ML IJ SOLN: 25.0000 ug | INTRAMUSCULAR | Status: DC | PRN

## 2024-05-19 MED ORDER — MIDAZOLAM HCL 2 MG/2ML IJ SOLN
INTRAMUSCULAR | Status: AC
  Filled 2024-05-19: qty 2

## 2024-05-19 MED ORDER — SODIUM CHLORIDE 0.9 % IV SOLN: INTRAVENOUS | Status: DC

## 2024-05-19 MED ORDER — FENTANYL CITRATE (PF) 250 MCG/5ML IJ SOLN
INTRAMUSCULAR | Status: DC | PRN
  Administered 2024-05-19 (×2): 100 ug via INTRAVENOUS
  Administered 2024-05-19: 50 ug via INTRAVENOUS

## 2024-05-19 MED ORDER — ORAL CARE MOUTH RINSE
15.0000 mL | Freq: Once | OROMUCOSAL | Status: AC
Start: 2024-05-19 — End: 2024-05-19

## 2024-05-19 MED ORDER — OXYCODONE HCL 5 MG PO TABS: 5.0000 mg | ORAL_TABLET | Freq: Four times a day (QID) | ORAL | 0 refills | Status: DC | PRN

## 2024-05-19 MED ORDER — SCOPOLAMINE 1 MG/3DAYS TD PT72
MEDICATED_PATCH | TRANSDERMAL | Status: AC
  Filled 2024-05-19: qty 1

## 2024-05-19 MED ORDER — MIDAZOLAM HCL 2 MG/2ML IJ SOLN
INTRAMUSCULAR | Status: DC | PRN
  Administered 2024-05-19: 2 mg via INTRAVENOUS

## 2024-05-19 MED ORDER — OXYCODONE HCL 5 MG PO TABS
5.0000 mg | ORAL_TABLET | ORAL | 0 refills | Status: DC | PRN
  Filled 2024-05-19: qty 10, 2d supply, fill #0

## 2024-05-19 MED ORDER — HYDROMORPHONE HCL 1 MG/ML IJ SOLN
INTRAMUSCULAR | Status: AC
  Filled 2024-05-19: qty 0.5

## 2024-05-19 MED ORDER — PROPOFOL 10 MG/ML IV BOLUS
INTRAVENOUS | Status: DC | PRN
  Administered 2024-05-19: 150 mg via INTRAVENOUS

## 2024-05-19 MED ORDER — FENTANYL CITRATE (PF) 250 MCG/5ML IJ SOLN
INTRAMUSCULAR | Status: AC
  Filled 2024-05-19: qty 5

## 2024-05-19 MED ORDER — ROCURONIUM BROMIDE 10 MG/ML (PF) SYRINGE
PREFILLED_SYRINGE | INTRAVENOUS | Status: DC | PRN
  Administered 2024-05-19: 50 mg via INTRAVENOUS

## 2024-05-19 MED ORDER — LACTATED RINGERS IV SOLN: INTRAVENOUS | Status: DC | PRN

## 2024-05-19 MED ORDER — DEXAMETHASONE SODIUM PHOSPHATE 10 MG/ML IJ SOLN
INTRAMUSCULAR | Status: DC | PRN
  Administered 2024-05-19: 10 mg via INTRAVENOUS

## 2024-05-19 MED ORDER — BUPIVACAINE-EPINEPHRINE (PF) 0.5% -1:200000 IJ SOLN
INTRAMUSCULAR | Status: AC
Start: 2024-05-19 — End: 2024-05-19
  Filled 2024-05-19: qty 30

## 2024-05-19 MED ORDER — OXYCODONE HCL 5 MG/5ML PO SOLN: 5.0000 mg | Freq: Once | ORAL | Status: DC | PRN

## 2024-05-19 MED ORDER — BUPIVACAINE-EPINEPHRINE 0.25% -1:200000 IJ SOLN
INTRAMUSCULAR | Status: DC | PRN
  Administered 2024-05-19: 6 mL

## 2024-05-19 SURGICAL SUPPLY — 36 items
BAG COUNTER SPONGE SURGICOUNT (BAG) ×1 IMPLANT
CANISTER SUCTION 3000ML PPV (SUCTIONS) ×1 IMPLANT
CHLORAPREP W/TINT 26 (MISCELLANEOUS) ×1 IMPLANT
CLIP APPLIE 5 13 M/L LIGAMAX5 (MISCELLANEOUS) IMPLANT
CLSR STERI-STRIP ANTIMIC 1/2X4 (GAUZE/BANDAGES/DRESSINGS) IMPLANT
COVER SURGICAL LIGHT HANDLE (MISCELLANEOUS) ×1 IMPLANT
CUTTER FLEX LINEAR 45M (STAPLE) ×1 IMPLANT
DERMABOND ADVANCED .7 DNX12 (GAUZE/BANDAGES/DRESSINGS) ×1 IMPLANT
ELECTRODE REM PT RTRN 9FT ADLT (ELECTROSURGICAL) ×1 IMPLANT
GLOVE BIO SURGEON STRL SZ7 (GLOVE) ×1 IMPLANT
GLOVE BIOGEL PI IND STRL 7.5 (GLOVE) ×1 IMPLANT
GOWN STRL REUS W/ TWL LRG LVL3 (GOWN DISPOSABLE) ×3 IMPLANT
GRASPER SUT TROCAR 14GX15 (MISCELLANEOUS) ×1 IMPLANT
IRRIGATION SUCT STRKRFLW 2 WTP (MISCELLANEOUS) ×1 IMPLANT
KIT BASIN OR (CUSTOM PROCEDURE TRAY) ×1 IMPLANT
KIT TURNOVER KIT B (KITS) ×1 IMPLANT
NS IRRIG 1000ML POUR BTL (IV SOLUTION) ×1 IMPLANT
PAD ARMBOARD POSITIONER FOAM (MISCELLANEOUS) ×2 IMPLANT
POUCH RETRIEVAL ECOSAC 10 (ENDOMECHANICALS) ×1 IMPLANT
RELOAD STAPLE 45 3.5 BLU ETS (ENDOMECHANICALS) ×1 IMPLANT
RELOAD STAPLE TA45 3.5 REG BLU (ENDOMECHANICALS) ×1 IMPLANT
SCISSORS LAP 5X35 DISP (ENDOMECHANICALS) IMPLANT
SET TUBE SMOKE EVAC HIGH FLOW (TUBING) ×1 IMPLANT
SHEARS HARMONIC ACE PLUS 36CM (ENDOMECHANICALS) ×1 IMPLANT
SLEEVE Z-THREAD 5X100MM (TROCAR) ×1 IMPLANT
SPECIMEN JAR SMALL (MISCELLANEOUS) ×1 IMPLANT
STRIP CLOSURE SKIN 1/2X4 (GAUZE/BANDAGES/DRESSINGS) ×1 IMPLANT
SUT MNCRL AB 4-0 PS2 18 (SUTURE) ×1 IMPLANT
SUT VICRYL 0 UR6 27IN ABS (SUTURE) ×1 IMPLANT
TOWEL GREEN STERILE (TOWEL DISPOSABLE) ×1 IMPLANT
TOWEL GREEN STERILE FF (TOWEL DISPOSABLE) ×1 IMPLANT
TRAY LAPAROSCOPIC MC (CUSTOM PROCEDURE TRAY) ×1 IMPLANT
TROCAR BALLN 12MMX100 BLUNT (TROCAR) ×1 IMPLANT
TROCAR Z-THREAD OPTICAL 5X100M (TROCAR) ×1 IMPLANT
WARMER LAPAROSCOPE (MISCELLANEOUS) ×1 IMPLANT
WATER STERILE IRR 1000ML POUR (IV SOLUTION) ×1 IMPLANT

## 2024-05-19 NOTE — Interval H&P Note (Signed)
 History and Physical Interval Note:  05/19/2024 9:43 AM I have seen and evaluated patient.  Discussed lap appy today, will need csc later [name]  has presented today for surgery, with the diagnosis of APPENDICITIS.  The various methods of treatment have been discussed with the patient and family. After consideration of risks, benefits and other options for treatment, the patient has consented to  Procedure(s): APPENDECTOMY, LAPAROSCOPIC (N/A) as a surgical intervention.  The patient's history has been reviewed, patient examined, no change in status, stable for surgery.  I have reviewed the patient's chart and labs.  Questions were answered to the patient's satisfaction.     Ashley Summers

## 2024-05-19 NOTE — Plan of Care (Signed)

## 2024-05-19 NOTE — Anesthesia Procedure Notes (Signed)
 Procedure Name: Intubation Date/Time: 05/19/2024 10:32 AM  Performed by: Loreda Rodriguez, CRNAPre-anesthesia Checklist: Patient identified, Emergency Drugs available, Suction available and Patient being monitored Patient Re-evaluated:Patient Re-evaluated prior to induction Oxygen Delivery Method: Circle System Utilized Preoxygenation: Pre-oxygenation with 100% oxygen Induction Type: IV induction Ventilation: Mask ventilation without difficulty Laryngoscope Size: Mac and 3 Grade View: Grade I Tube type: Oral Tube size: 7.0 mm Number of attempts: 1 Airway Equipment and Method: Stylet and Oral airway Placement Confirmation: ETT inserted through vocal cords under direct vision, positive ETCO2 and breath sounds checked- equal and bilateral Secured at: 21 cm Tube secured with: Tape Dental Injury: Teeth and Oropharynx as per pre-operative assessment

## 2024-05-19 NOTE — Transfer of Care (Signed)
 Immediate Anesthesia Transfer of Care Note  Patient: Ashley Summers  Procedure(s) Performed: APPENDECTOMY, LAPAROSCOPIC (Abdomen)  Patient Location: PACU  Anesthesia Type:General  Level of Consciousness: awake, alert , and oriented  Airway & Oxygen Therapy: Patient Spontanous Breathing and Patient connected to nasal cannula oxygen  Post-op Assessment: Report given to RN and Post -op Vital signs reviewed and stable  Post vital signs: Reviewed and stable  Last Vitals:  Vitals Value Taken Time  BP 149/90 05/19/24 11:17  Temp 36.6 C 05/19/24 11:17  Pulse 75 05/19/24 11:21  Resp 16 05/19/24 11:22  SpO2 97 % 05/19/24 11:21  Vitals shown include unfiled device data.  Last Pain:  Vitals:   05/19/24 0801  TempSrc:   PainSc: 0-No pain         Complications: No notable events documented.

## 2024-05-19 NOTE — Anesthesia Preprocedure Evaluation (Addendum)
 Anesthesia Evaluation  Patient identified by MRN, date of birth, ID band Patient awake    Reviewed: Allergy & Precautions, NPO status , Patient's Chart, lab work & pertinent test results  Airway Mallampati: II  TM Distance: >3 FB Neck ROM: Full    Dental no notable dental hx. (+) Teeth Intact, Dental Advisory Given   Pulmonary sleep apnea (no CPAP)    Pulmonary exam normal breath sounds clear to auscultation       Cardiovascular hypertension, Pt. on medications Normal cardiovascular exam Rhythm:Regular Rate:Normal     Neuro/Psych  PSYCHIATRIC DISORDERS Anxiety     negative neurological ROS     GI/Hepatic negative GI ROS, Neg liver ROS,,,  Endo/Other  negative endocrine ROS    Renal/GU negative Renal ROS  negative genitourinary   Musculoskeletal negative musculoskeletal ROS (+)    Abdominal   Peds  Hematology negative hematology ROS (+)   Anesthesia Other Findings   Reproductive/Obstetrics                             Anesthesia Physical Anesthesia Plan  ASA: 2  Anesthesia Plan: General   Post-op Pain Management: Ketamine IV* and Ofirmev IV (intra-op)*   Induction: Intravenous  PONV Risk Score and Plan: 3 and Midazolam, Dexamethasone and Ondansetron  Airway Management Planned: Oral ETT  Additional Equipment:   Intra-op Plan:   Post-operative Plan: Extubation in OR  Informed Consent: I have reviewed the patients History and Physical, chart, labs and discussed the procedure including the risks, benefits and alternatives for the proposed anesthesia with the patient or authorized representative who has indicated his/her understanding and acceptance.     Dental advisory given  Plan Discussed with: CRNA  Anesthesia Plan Comments:        Anesthesia Quick Evaluation

## 2024-05-19 NOTE — Plan of Care (Signed)

## 2024-05-19 NOTE — Discharge Summary (Signed)
 [facility] Discharge Summary   Patient ID: Ashley Summers MRN: [id number] DOB/AGE: 1981-03-31 43 y.o.  Admit date: 05/18/2024 Discharge date: 05/19/2024  Admitting Diagnosis: Acute appendicitis   Discharge Diagnosis Patient Active Problem List   Diagnosis Date Noted   Appendicitis, acute 05/18/2024   Acute appendicitis 05/18/2024   Panniculitis 09/20/2020   Essential hypertension 09/09/2020   Palpitations 09/09/2020   OSA (obstructive sleep apnea) 04/23/2018   Hemorrhoid 02/03/2016    Consultants  Imaging: CT ABDOMEN PELVIS W CONTRAST Result Date: 05/18/2024 CLINICAL DATA:  Abdominal pain. EXAM: CT ABDOMEN AND PELVIS WITH CONTRAST TECHNIQUE: Multidetector CT imaging of the abdomen and pelvis was performed using the standard protocol following bolus administration of intravenous contrast. RADIATION DOSE REDUCTION: This exam was performed according to the departmental dose-optimization program which includes automated exposure control, adjustment of the mA and/or kV according to patient size and/or use of iterative reconstruction technique. CONTRAST:  100mL OMNIPAQUE IOHEXOL 300 MG/ML  SOLN COMPARISON:  None Available. FINDINGS: Lower chest: No acute abnormality. No pleural or pericardial effusions. Hepatobiliary: No focal liver abnormality is seen. No gallstones, gallbladder wall thickening, or biliary dilatation. Pancreas: Unremarkable. No pancreatic ductal dilatation or surrounding inflammatory changes. Spleen: Normal in size without focal abnormality. Adrenals/Urinary Tract: Adrenal glands are unremarkable. Kidneys are normal, without renal calculi, focal lesion, or hydronephrosis. Bladder is unremarkable. Stomach/Bowel: No bowel dilatation to suggest obstruction. Unremarkable appearance of stomach. There is an abnormal appendix with periappendiceal fat stranding consistent with acute appendicitis. No evidence of abscess. Vascular/Lymphatic: No significant vascular  findings are present. No enlarged abdominal or pelvic lymph nodes. Reproductive: Uterus and bilateral adnexa are unremarkable. Other: No abdominal wall hernia or abnormality. No abdominopelvic ascites. Musculoskeletal: No acute or significant osseous findings. IMPRESSION: Acute appendicitis.  No abscess or obstruction. Electronically Signed   By: Sydell Eva M.D.   On: 05/18/2024 19:01    Procedures  Dr. Delane Fear (05/19/24) - Laparoscopic Appendectomy  Hospital Course:  Patient was admitted and underwent procedure listed above. Tolerated procedure well and was transferred to the floor. Diet was advanced as tolerated.  On POD 0, the patient was voiding well, tolerating diet, ambulating well, pain well controlled, vital signs stable, incisions c/d/i and felt stable for discharge home.  Patient will follow up in our office in 4 weeks and knows to call with questions or concerns. She will call to confirm appointment date/time.    Physical Exam: General:  Alert, NAD, pleasant, comfortable Abd:  Soft, ND, mild tenderness, incisions C/D/I       Follow-up Information     Maczis, Loura Rower, PA-C Follow up on 06/18/2024.   Specialty: General Surgery Why: 2:30pm, Arrive 30 minutes prior to your appointment time, Please bring your insurance card and photo ID Contact information: [address] [facility] [place] [address] [phone]                 Signed: Mirta Ammon, [facility] 05/19/2024, 3:12 PM

## 2024-05-19 NOTE — Op Note (Signed)
 Preoperative diagnosis: Acute appendicitis Postoperative diagnosis: Acute suppurative appendicitis Procedure: Laparoscopic appendectomy Surgeon: Dr. Donavan Fuchs Anesthesia: General Estimated blood loss: Minimal Specimens: Appendix to pathology Complications: None Drains: None Sponge needle count was correct completion Disposition recovery stable condition  Indications: This a 43 year old female with right lower quadrant pain and a CT scan and an exam consistent with acute appendicitis.  I discussed going to the operating room for a laparoscopic appendectomy.  Procedure: After informed consent was obtained she was taken to the operating room.  She was given antibiotics.  SCDs were in place.  She was placed under general anesthesia without complication.  She was prepped and draped in standard sterile surgical fashion.  Surgical timeout was then performed.  I then made a incision below the umbilicus after filtrating Marcaine.  I carried this to the fascia.  The fascia was incised sharply.  There is a fair amount of scar tissue from her prior abdominoplasty.  I then entered into the abdomen without difficulty.  There was no injury noted.  I then placed a 0 Vicryl pursestring suture through the fascia and inserted a Hassan trocar.  The abdomen was then insufflated to 15 mmHg pressure.  I placed 2 additional 5 mm trocars in the lower abdomen under direct vision without complication.  I was able to identify her cecum.  Her omentum had encased the appendix.  This was dissected off as well as the veil of Treves.  The appendix was noted to be acutely suppurative but was not perforated.  I then took down the appendiceal mesentery with the harmonic.  I then got to the base.  I divided the appendix at the base with the cecum with a GIA stapler.  This was placed in retrieval bag and removed.  Hemostasis was then observed.  I then removed my Hassan trocar and tied my pursestring down.  Using the suture passer  device I then placed an additional suture to completely obliterate that defect.  I then desufflated the abdomen and removed my remaining trocars.  These were closed with 4-0 Monocryl and glue.  She tolerated this well was extubated and transferred recovery stable.

## 2024-05-19 NOTE — Anesthesia Postprocedure Evaluation (Signed)
 Anesthesia Post Note  Patient: Jermisha Hoffart  Procedure(s) Performed: APPENDECTOMY, LAPAROSCOPIC (Abdomen)     Patient location during evaluation: PACU Anesthesia Type: General Level of consciousness: awake and alert Pain management: pain level controlled Vital Signs Assessment: post-procedure vital signs reviewed and stable Respiratory status: spontaneous breathing, nonlabored ventilation, respiratory function stable and patient connected to nasal cannula oxygen Cardiovascular status: blood pressure returned to baseline and stable Postop Assessment: no apparent nausea or vomiting Anesthetic complications: no  There were no known notable events for this encounter.  Last Vitals:  Vitals:   05/19/24 1130 05/19/24 1145  BP: 134/70 133/66  Pulse: 67 60  Resp: 10 14  Temp:  36.6 C  SpO2: 98% 100%    Last Pain:  Vitals:   05/19/24 1145  TempSrc:   PainSc: Asleep                 Xandria Gallaga L Ariyan Sinnett

## 2024-05-19 NOTE — Discharge Instructions (Addendum)

## 2024-05-20 ENCOUNTER — Encounter: Admitting: Gastroenterology

## 2024-05-20 ENCOUNTER — Encounter (HOSPITAL_COMMUNITY): Payer: Self-pay | Admitting: General Surgery

## 2024-05-20 ENCOUNTER — Telehealth: Payer: Self-pay

## 2024-05-20 LAB — SURGICAL PATHOLOGY

## 2024-05-20 NOTE — Transitions of Care (Post Inpatient/ED Visit) (Signed)
 05/20/2024  Name: Ashley Summers MRN: [id number] DOB: Jun 13, 1981  Today's TOC FU Call Status: Today's TOC FU Call Status:: Successful TOC FU Call Completed TOC FU Call Complete Date: 05/20/24 Patient's Name and Date of Birth confirmed.  Transition Care Management Follow-up Telephone Call Date of Discharge: 05/19/24 Discharge Facility: [facility]) Type of Discharge: Inpatient Admission Primary Inpatient Discharge Diagnosis:: appendicitis How have you been since you were released from the hospital?: Better Any questions or concerns?: No  Items Reviewed: Did you receive and understand the discharge instructions provided?: Yes Medications obtained,verified, and reconciled?: Yes (Medications Reviewed) Any new allergies since your discharge?: No Dietary orders reviewed?: Yes Do you have support at home?: Yes People in Home [RPT]: significant other, child(ren), adult  Medications Reviewed Today: Medications Reviewed Today     Reviewed by Darrall Ellison, LPN (Licensed Practical Nurse) on 05/20/24 at [phone]  Med List Status: <None>   Medication Order Taking? Sig Documenting Provider Last Dose Status Informant  acetaminophen (TYLENOL) 500 MG tablet [id number] Yes Take 500-1,000 mg by mouth every 6 (six) hours as needed for moderate pain (pain score 4-6). [provider]  Active Self, Pharmacy Records  Biotin 1000 MCG tablet [id number]  Take 1,000 mcg by mouth daily. [provider]  Active Self, Pharmacy Records  cetirizine  (ZYRTEC ) 10 MG tablet [id number] Yes Take 1 tablet (10 mg total) by mouth daily. Viola Greulich, MD  Active Self, Pharmacy Records  cholecalciferol (VITAMIN D3) 25 MCG (1000 UNIT) tablet [id number] Yes Take 2,000 Units by mouth daily. [provider]  Active Self, Pharmacy Records  docusate sodium (COLACE) 100 MG capsule [id number] Yes Take 100 mg by mouth daily as needed for mild constipation. as needed [provider]  Active  Self, Pharmacy Records  hydrochlorothiazide  (HYDRODIURIL ) 25 MG tablet [id number] Yes Take 1 tablet (25 mg total) by mouth daily. Viola Greulich, MD  Active Self, Pharmacy Records  hydrOXYzine  (ATARAX ) 10 MG tablet [id number] Yes Take 1 tablet (10 mg) by mouth 3 times daily as needed for anxiety. Viola Greulich, MD  Active Self, Pharmacy Records  ibuprofen  (ADVIL ) 200 MG tablet [id number] Yes Take 200-800 mg by mouth every 6 (six) hours as needed for moderate pain (pain score 4-6). [provider]  Active Self, Pharmacy Records  Multiple Vitamins-Minerals (CERTAVITE/ANTIOXIDANTS) TABS [id number] Yes Take 1 tablet by mouth daily. Viola Greulich, MD  Active Self, Pharmacy Records  nystatin  cream (MYCOSTATIN ) [id number] Yes Apply 1 application topically 2 (two) times daily under breast  Patient taking differently: Apply 1 Application topically 2 (two) times daily as needed for dry skin.   Susanna Epley, FNP  Active Self, Pharmacy Records  oxyCODONE (OXY IR/ROXICODONE) 5 MG immediate release tablet [id number] Yes Take 1-2 tablets (5-10 mg total) by mouth every 6 (six) hours as needed for severe pain (pain score 7-10). Mirta Ammon, PA-C  Active   oxyCODONE (OXY IR/ROXICODONE) 5 MG immediate release tablet [id number] Yes Take 1-2 tablets (5-10 mg total) by mouth every 6 (six) hours as needed for severe pain   Active   pantoprazole  (PROTONIX ) 40 MG tablet [id number] Yes Take 1 tablet (40 mg total) by mouth daily before breakfast. Viola Greulich, MD  Active Self, Pharmacy Records  polyethylene glycol powder (GLYCOLAX /MIRALAX ) 17 GM/SCOOP powder [id number] Yes Take 17 g by mouth daily for 5 days prior to colonoscopy.  Patient taking differently: Take 119 g by mouth daily as needed for moderate constipation.   Mansouraty, Albino Alu., MD  Active Self, Pharmacy Records           Med Note Micah Ade   Tue May 19, 2024 12:30 PM) Was taking daily for 5 days prior to colonoscopy.              Home Care and Equipment/Supplies: Were Home Health Services Ordered?: NA Any new equipment or medical supplies ordered?: NA  Functional Questionnaire: Do you need assistance with bathing/showering or dressing?: No Do you need assistance with meal preparation?: No Do you need assistance with eating?: No Do you have difficulty maintaining continence: No Do you need assistance with getting out of bed/getting out of a chair/moving?: No Do you have difficulty managing or taking your medications?: No  Follow up appointments reviewed: PCP Follow-up appointment confirmed?: NA Specialist Hospital Follow-up appointment confirmed?: Yes Date of Specialist follow-up appointment?: 05/28/24 Follow-Up Specialty Provider:: surgeon Do you need transportation to your follow-up appointment?: No Do you understand care options if your condition(s) worsen?: Yes-patient verbalized understanding    SIGNATURE Darrall Ellison, LPN [facility] Nurse Health Advisor Direct Dial [phone]

## 2024-05-21 ENCOUNTER — Other Ambulatory Visit: Payer: Self-pay | Admitting: Nurse Practitioner

## 2024-05-21 ENCOUNTER — Encounter: Payer: Self-pay | Admitting: Nurse Practitioner

## 2024-05-21 ENCOUNTER — Other Ambulatory Visit (HOSPITAL_COMMUNITY): Payer: Self-pay

## 2024-05-21 DIAGNOSIS — Z3041 Encounter for surveillance of contraceptive pills: Secondary | ICD-10-CM

## 2024-05-21 MED ORDER — SLYND 4 MG PO TABS
1.0000 | ORAL_TABLET | Freq: Every day | ORAL | 2 refills | Status: DC
  Filled 2024-05-21: qty 28, 28d supply, fill #0

## 2024-05-21 MED ORDER — SLYND 4 MG PO TABS
1.0000 | ORAL_TABLET | Freq: Every day | ORAL | 2 refills | Status: DC
  Filled 2024-05-21: qty 28, 28d supply, fill #0
  Filled 2024-06-22: qty 28, 28d supply, fill #1
  Filled 2024-07-16: qty 28, 28d supply, fill #2

## 2024-05-22 ENCOUNTER — Encounter: Payer: Self-pay | Admitting: Family Medicine

## 2024-05-22 ENCOUNTER — Ambulatory Visit: Admitting: Family Medicine

## 2024-05-22 VITALS — BP 130/80 | HR 64 | Temp 98.3°F | Ht 64.0 in | Wt 193.0 lb

## 2024-05-22 DIAGNOSIS — Z09 Encounter for follow-up examination after completed treatment for conditions other than malignant neoplasm: Secondary | ICD-10-CM | POA: Diagnosis not present

## 2024-05-22 DIAGNOSIS — Z9049 Acquired absence of other specified parts of digestive tract: Secondary | ICD-10-CM | POA: Diagnosis not present

## 2024-05-22 DIAGNOSIS — M25552 Pain in left hip: Secondary | ICD-10-CM | POA: Diagnosis not present

## 2024-05-22 NOTE — Telephone Encounter (Signed)
 Would advise patient to set up a follow-up appointment as anxiety last discussed 05/08/2023 per chart review.  Patient is also advised to set up appointment with [facility] for counseling.

## 2024-05-22 NOTE — Progress Notes (Signed)
 Established Patient Office Visit   Subjective  Patient ID: Ashley Summers, female    DOB: 1981/03/14  Age: 43 y.o. MRN: [id number]  Chief Complaint  Patient presents with   Hospitalization Follow-up    Appendectomy; little sore still but only concern is getting back on birth control  Pt accompanied by her daughter.  HPI  Pt is a 43 yo female seen for HFU.  Pt hospitalized 6/16-6-17 for acute appendicitis s/p lap appy.  Pt doing well.  Had some constipation after, but taking colace and miralax .  Had BM this morning.  Having an occasional burning sensation in abdomen and intermittent right shoulder pain.  Steri-Strips in place.  Has appointment to follow-up with GEN surg in July.  Started noticing left hip pain times a few days.  Was causing a limp.  Unsure if overdid it with walking. Patient Active Problem List   Diagnosis Date Noted   Appendicitis, acute 05/18/2024   Acute appendicitis 05/18/2024   Panniculitis 09/20/2020   Essential hypertension 09/09/2020   Palpitations 09/09/2020   OSA (obstructive sleep apnea) 04/23/2018   Hemorrhoid 02/03/2016   Past Medical History:  Diagnosis Date   Allergy    Anxiety    Essential hypertension 09/09/2020   Hypertension    Palpitations 09/09/2020   Past Surgical History:  Procedure Laterality Date   BREAST SURGERY     Reduction    COMBINED ABDOMINOPLASTY AND LIPOSUCTION     2023   Hammertoe correction     LAPAROSCOPIC APPENDECTOMY N/A 05/19/2024   Procedure: APPENDECTOMY, LAPAROSCOPIC;  Surgeon: Enid Harry, MD;  Location: [facility] OR;  Service: General;  Laterality: N/A;   REDUCTION MAMMAPLASTY     Social History   Tobacco Use   Smoking status: Never   Smokeless tobacco: Never  Vaping Use   Vaping status: Never Used  Substance Use Topics   Alcohol use: Yes    Alcohol/week: 0.0 standard drinks of alcohol    Comment: occasional   Drug use: No   Family History  Problem Relation Age of Onset   Hypertension Mother     Stroke Mother    Pulmonary embolism Mother    Hypertension Brother    Hyperlipidemia Maternal Grandmother    Hypertension Maternal Grandmother    Colon cancer Neg Hx    Esophageal cancer Neg Hx    Rectal cancer Neg Hx    Stomach cancer Neg Hx    Allergies  Allergen Reactions   Latex Rash   Penicillins Rash    ROS Negative unless stated above    Objective:     BP 130/80   Pulse 64   Temp 98.3 F (36.8 C)   Ht 5' 4 (1.626 m)   Wt 193 lb (87.5 kg)   LMP  (LMP Unknown)   SpO2 98%   BMI 33.13 kg/m  BP Readings from Last 3 Encounters:  05/22/24 130/80  05/19/24 117/77  02/27/24 120/76   Wt Readings from Last 3 Encounters:  05/22/24 193 lb (87.5 kg)  05/19/24 190 lb (86.2 kg)  05/01/24 195 lb (88.5 kg)      Physical Exam Constitutional:      General: She is not in acute distress.    Appearance: Normal appearance.  HENT:     Head: Normocephalic and atraumatic.     Nose: Nose normal.     Mouth/Throat:     Mouth: Mucous membranes are moist.   Cardiovascular:     Rate and Rhythm: Normal rate and  regular rhythm.     Heart sounds: Normal heart sounds. No murmur heard.    No gallop.  Pulmonary:     Effort: Pulmonary effort is normal. No respiratory distress.     Breath sounds: Normal breath sounds. No wheezing, rhonchi or rales.  Abdominal:     General: Bowel sounds are normal.     Palpations: Abdomen is soft.     Tenderness: There is abdominal tenderness in the left lower quadrant. There is no guarding or rebound.   Musculoskeletal:     Right hip: Normal.     Left hip: Bony tenderness present.       Legs:     Comments: TTP of left lateral hip and upper lateral thigh.  Negative logroll and straight leg raise.  Positive FADIR and FABER.   Skin:    General: Skin is warm and dry.   Neurological:     Mental Status: She is alert and oriented to person, place, and time.        02/27/2024    2:36 PM 12/18/2023    3:50 PM 11/13/2023    8:17 AM   Depression screen PHQ 2/9  Decreased Interest 0 0 1  Down, Depressed, Hopeless 0 0 0  PHQ - 2 Score 0 0 1  Altered sleeping  1 3  Tired, decreased energy  0 1  Change in appetite  0 1  Feeling bad or failure about yourself   0 0  Trouble concentrating  0 3  Moving slowly or fidgety/restless  0 0  Suicidal thoughts  0 0  PHQ-9 Score  1 9  Difficult doing work/chores  Not difficult at all Not difficult at all      12/18/2023    3:50 PM 11/13/2023    8:17 AM 05/08/2023    8:38 AM 05/21/2022   10:54 AM  GAD 7 : Generalized Anxiety Score  Nervous, Anxious, on Edge 2 3 2 2   Control/stop worrying 2 3 2 3   Worry too much - different things 2 3 2 3   Trouble relaxing 1 1 1 1   Restless 0 0 0 1  Easily annoyed or irritable 0 1 1 3   Afraid - awful might happen 3 3 2 3   Total GAD 7 Score 10 14 10 16   Anxiety Difficulty   Not difficult at all Not difficult at all     No results found for any visits on 05/22/24.    Assessment & Plan:   S/P laparoscopic appendectomy  Left hip pain  Hospital discharge follow-up   Doing well status post lap appendectomy on 05/18/2024.  TCM phone call made and reviewed.  Notes, imaging, labs from hospitalization also reviewed.  Lap sites healing appropriately with Steri-Strips in place.  Continue activity as tolerated.  No heavy lifting.  Keep postop follow-up appointment with general surgery.  Bowel regimen.  Left hip pain likely 2/2 overuse.  Discussed supportive care including ice, heat, stretching, massage, topical analgesics, Tylenol, insoles for shoes.  For continued or worsening symptoms image.  Return if symptoms worsen or fail to improve.   Viola Greulich, MD

## 2024-05-25 NOTE — Telephone Encounter (Signed)
 Patient is sch for 6/25 at 4:30pm

## 2024-05-27 ENCOUNTER — Other Ambulatory Visit (HOSPITAL_COMMUNITY): Payer: Self-pay

## 2024-05-27 ENCOUNTER — Encounter: Payer: Self-pay | Admitting: Family Medicine

## 2024-05-27 ENCOUNTER — Telehealth (INDEPENDENT_AMBULATORY_CARE_PROVIDER_SITE_OTHER): Admitting: Family Medicine

## 2024-05-27 DIAGNOSIS — F411 Generalized anxiety disorder: Secondary | ICD-10-CM | POA: Diagnosis not present

## 2024-05-27 MED ORDER — SERTRALINE HCL 25 MG PO TABS
25.0000 mg | ORAL_TABLET | Freq: Every day | ORAL | 3 refills | Status: DC
  Filled 2024-05-27: qty 30, 30d supply, fill #0
  Filled 2024-06-22: qty 30, 30d supply, fill #1

## 2024-05-27 NOTE — Progress Notes (Signed)
 Patient was unable to self-report due to a lack of equipment at home via telehealth

## 2024-05-27 NOTE — Progress Notes (Signed)
 Virtual Visit via Video Note  I connected with Ashley Summers on 05/27/24 at  4:30 PM EDT by a video enabled telemedicine application and verified that I am speaking with the correct person using two identifiers.  Location patient: home Location provider:work or home office Persons participating in the virtual visit: patient, provider  I discussed the limitations of evaluation and management by telemedicine and the availability of in person appointments. The patient expressed understanding and agreed to proceed.   HPI: Pt is a 43 yo female seen for ongoing anxiety.  Patient seen on 05/22/2024 however forgot to mention anxiety due to other ongoing concerns.  Tried taking Hydroxyzine  10 mg but it causes a HA.  Has not noticed that it help with anxiety.  The HA is bad, splitting makes the back of her eyes hurt.  Will have to take tylenol or something for the HA.  Pt started getting palpitations again at work and at night.  Thought it was due to caffeine so stopped coffee.  Hasn't had the palpitations, but still getting heart racing, nervous feeling in body, sweating.  My happen at least 3-4 x per wk. Last a few minutes.  Pt has a child in the [employer] which causes anxiety.  Happened while at [employer] job, felt panic due to feeling stuck in bathroom with client who had an electric scooter in the way.  Pt felt panicked in the open CT and in EMS.  Does not get in back of a car.  Will typically always drive.  Patient still doing well status post lap appendectomy.  Scheduled to go back to work next week.  ROS: See pertinent positives and negatives per HPI.  Past Medical History:  Diagnosis Date   Allergy    Anxiety    Essential hypertension 09/09/2020   GERD (gastroesophageal reflux disease)    Hypertension    Palpitations 09/09/2020    Past Surgical History:  Procedure Laterality Date   BREAST SURGERY     Reduction    CESAREAN SECTION     COMBINED ABDOMINOPLASTY AND LIPOSUCTION     2023   COSMETIC  SURGERY  05/10/22   Hammertoe correction     LAPAROSCOPIC APPENDECTOMY N/A 05/19/2024   Procedure: APPENDECTOMY, LAPAROSCOPIC;  Surgeon: Ebbie Cough, MD;  Location: [facility] OR;  Service: General;  Laterality: N/A;   REDUCTION MAMMAPLASTY      Family History  Problem Relation Age of Onset   Hypertension Mother    Stroke Mother    Pulmonary embolism Mother    Hyperlipidemia Mother    Hypertension Brother    Hyperlipidemia Brother    Hyperlipidemia Maternal Grandmother    Hypertension Maternal Grandmother    Asthma Daughter    Diabetes Maternal Aunt    Colon cancer Neg Hx    Esophageal cancer Neg Hx    Rectal cancer Neg Hx    Stomach cancer Neg Hx     Current Outpatient Medications:    acetaminophen (TYLENOL) 500 MG tablet, Take 500-1,000 mg by mouth every 6 (six) hours as needed for moderate pain (pain score 4-6)., Disp: , Rfl:    cetirizine  (ZYRTEC ) 10 MG tablet, Take 1 tablet (10 mg total) by mouth daily., Disp: 90 tablet, Rfl: 3   cholecalciferol (VITAMIN D3) 25 MCG (1000 UNIT) tablet, Take 2,000 Units by mouth daily., Disp: , Rfl:    docusate sodium (COLACE) 100 MG capsule, Take 100 mg by mouth daily as needed for mild constipation. as needed, Disp: , Rfl:  Drospirenone  (SLYND ) 4 MG TABS, Take 1 tablet (4 mg total) by mouth daily., Disp: 28 tablet, Rfl: 2   hydrochlorothiazide  (HYDRODIURIL ) 25 MG tablet, Take 1 tablet (25 mg total) by mouth daily., Disp: 90 tablet, Rfl: 2   hydrOXYzine  (ATARAX ) 10 MG tablet, Take 1 tablet (10 mg) by mouth 3 times daily as needed for anxiety., Disp: 90 tablet, Rfl: 6   ibuprofen  (ADVIL ) 200 MG tablet, Take 200-800 mg by mouth every 6 (six) hours as needed for moderate pain (pain score 4-6)., Disp: , Rfl:    Multiple Vitamins-Minerals (CERTAVITE/ANTIOXIDANTS) TABS, Take 1 tablet by mouth daily., Disp: 90 tablet, Rfl: 3   nystatin  cream (MYCOSTATIN ), Apply 1 application topically 2 (two) times daily under breast, Disp: 30 g, Rfl: 3    pantoprazole  (PROTONIX ) 40 MG tablet, Take 1 tablet (40 mg total) by mouth daily before breakfast., Disp: 30 tablet, Rfl: 2   polyethylene glycol powder (GLYCOLAX /MIRALAX ) 17 GM/SCOOP powder, Take 17 g by mouth daily for 5 days prior to colonoscopy. (Patient taking differently: Take 119 g by mouth daily as needed for moderate constipation.), Disp: 238 g, Rfl: 3   oxyCODONE  (OXY IR/ROXICODONE ) 5 MG immediate release tablet, Take 1-2 tablets (5-10 mg total) by mouth every 6 (six) hours as needed for severe pain (pain score 7-10). (Patient not taking: Reported on 05/27/2024), Disp: 12 tablet, Rfl: 0   oxyCODONE  (OXY IR/ROXICODONE ) 5 MG immediate release tablet, Take 1-2 tablets (5-10 mg total) by mouth every 6 (six) hours as needed for severe pain (Patient not taking: Reported on 05/27/2024), Disp: 12 tablet, Rfl: 0  EXAM:  VITALS per patient if applicable:  RR 12-20 bpm  GENERAL: alert, oriented, appears well and in no acute distress  HEENT: atraumatic, conjunctiva clear, no obvious abnormalities on inspection of external nose and ears  NECK: normal movements of the head and neck  LUNGS: on inspection no signs of respiratory distress, breathing rate appears normal, no obvious gross SOB, gasping or wheezing  CV: no obvious cyanosis  MS: moves all visible extremities without noticeable abnormality  PSYCH/NEURO: pleasant and cooperative, no obvious depression or anxiety, speech and thought processing grossly intact     05/27/2024    4:23 PM 02/27/2024    2:36 PM 12/18/2023    3:50 PM  Depression screen PHQ 2/9  Decreased Interest 0 0 0  Down, Depressed, Hopeless 0 0 0  PHQ - 2 Score 0 0 0  Altered sleeping 1  1  Tired, decreased energy 0  0  Change in appetite 0  0  Feeling bad or failure about yourself  0  0  Trouble concentrating 0  0  Moving slowly or fidgety/restless 0  0  Suicidal thoughts 0  0  PHQ-9 Score 1  1  Difficult doing work/chores   Not difficult at all      05/27/2024     4:25 PM 12/18/2023    3:50 PM 11/13/2023    8:17 AM 05/08/2023    8:38 AM  GAD 7 : Generalized Anxiety Score  Nervous, Anxious, on Edge 3 2 3 2   Control/stop worrying 3 2 3 2   Worry too much - different things 3 2 3 2   Trouble relaxing 2 1 1 1   Restless 3 0 0 0  Easily annoyed or irritable 1 0 1 1  Afraid - awful might happen 3 3 3 2   Total GAD 7 Score 18 10 14 10   Anxiety Difficulty    Not difficult at  all     ASSESSMENT AND PLAN:  Discussed the following assessment and plan:  GAD (generalized anxiety disorder) - Plan: sertraline (ZOLOFT) 25 MG tablet  Increased anxiety symptoms.  PHQ-9 score 1 and GAD-7 score 18.  Discussed various options including counseling and medication.  Patient wishes to start medication.  Will consider trying counseling again.  Discussed r/b/a of medication.  Will start Zoloft 25 mg daily.  Self-care encouraged.  Mindfulness also encouraged.    Follow-up in 4-6 weeks, sooner if needed.    I discussed the assessment and treatment plan with the patient. The patient was provided an opportunity to ask questions and all were answered. The patient agreed with the plan and demonstrated an understanding of the instructions.   The patient was advised to call back or seek an in-person evaluation if the symptoms worsen or if the condition fails to improve as anticipated.   Clotilda JONELLE Single, MD

## 2024-06-08 ENCOUNTER — Other Ambulatory Visit (HOSPITAL_COMMUNITY): Payer: Self-pay

## 2024-06-12 ENCOUNTER — Ambulatory Visit: Admitting: Family Medicine

## 2024-06-22 ENCOUNTER — Other Ambulatory Visit (HOSPITAL_COMMUNITY): Payer: Self-pay

## 2024-07-05 ENCOUNTER — Encounter: Payer: Self-pay | Admitting: Gastroenterology

## 2024-07-10 ENCOUNTER — Ambulatory Visit: Admitting: Gastroenterology

## 2024-07-10 ENCOUNTER — Encounter: Payer: Self-pay | Admitting: Gastroenterology

## 2024-07-10 VITALS — BP 127/81 | HR 57 | Temp 97.9°F | Resp 17 | Ht 64.0 in | Wt 195.0 lb

## 2024-07-10 DIAGNOSIS — Z1211 Encounter for screening for malignant neoplasm of colon: Secondary | ICD-10-CM | POA: Diagnosis not present

## 2024-07-10 DIAGNOSIS — K5730 Diverticulosis of large intestine without perforation or abscess without bleeding: Secondary | ICD-10-CM

## 2024-07-10 DIAGNOSIS — K641 Second degree hemorrhoids: Secondary | ICD-10-CM | POA: Diagnosis not present

## 2024-07-10 DIAGNOSIS — K649 Unspecified hemorrhoids: Secondary | ICD-10-CM

## 2024-07-10 DIAGNOSIS — D127 Benign neoplasm of rectosigmoid junction: Secondary | ICD-10-CM | POA: Diagnosis not present

## 2024-07-10 DIAGNOSIS — F419 Anxiety disorder, unspecified: Secondary | ICD-10-CM | POA: Diagnosis not present

## 2024-07-10 DIAGNOSIS — K635 Polyp of colon: Secondary | ICD-10-CM

## 2024-07-10 DIAGNOSIS — K644 Residual hemorrhoidal skin tags: Secondary | ICD-10-CM | POA: Diagnosis not present

## 2024-07-10 DIAGNOSIS — I10 Essential (primary) hypertension: Secondary | ICD-10-CM | POA: Diagnosis not present

## 2024-07-10 MED ORDER — SODIUM CHLORIDE 0.9 % IV SOLN: 500.0000 mL | Freq: Once | INTRAVENOUS | Status: DC

## 2024-07-10 NOTE — Progress Notes (Signed)
 Pt walked and sat on toilet then pt said her discomfort 1-2 on pain scale ,feeling better . Dr Wilhelmenia in to check pt and ok'd pt for discharge

## 2024-07-10 NOTE — Progress Notes (Signed)
 Called to room to assist during endoscopic procedure.  Patient ID and intended procedure confirmed with present staff. Received instructions for my participation in the procedure from the performing physician.

## 2024-07-10 NOTE — Progress Notes (Signed)
 GASTROENTEROLOGY PROCEDURE H&P NOTE   Primary Care Physician: Mercer Clotilda SAUNDERS, MD  HPI: Ashley Summers is a 43 y.o. female who presents for Colonoscopy for screening.  Past Medical History:  Diagnosis Date   Allergy    Anxiety    Essential hypertension 09/09/2020   GERD (gastroesophageal reflux disease)    Hypertension    Palpitations 09/09/2020   Past Surgical History:  Procedure Laterality Date   BREAST SURGERY     Reduction    CESAREAN SECTION     COMBINED ABDOMINOPLASTY AND LIPOSUCTION     2023   COSMETIC SURGERY  05/10/22   Hammertoe correction     LAPAROSCOPIC APPENDECTOMY N/A 05/19/2024   Procedure: APPENDECTOMY, LAPAROSCOPIC;  Surgeon: Ebbie Cough, MD;  Location: [facility] OR;  Service: General;  Laterality: N/A;   REDUCTION MAMMAPLASTY     Current Outpatient Medications  Medication Sig Dispense Refill   acetaminophen  (TYLENOL ) 500 MG tablet Take 500-1,000 mg by mouth every 6 (six) hours as needed for moderate pain (pain score 4-6).     cetirizine  (ZYRTEC ) 10 MG tablet Take 1 tablet (10 mg total) by mouth daily. 90 tablet 3   cholecalciferol (VITAMIN D3) 25 MCG (1000 UNIT) tablet Take 2,000 Units by mouth daily.     docusate sodium (COLACE) 100 MG capsule Take 100 mg by mouth daily as needed for mild constipation. as needed     Drospirenone  (SLYND ) 4 MG TABS Take 1 tablet (4 mg total) by mouth daily. 28 tablet 2   hydrochlorothiazide  (HYDRODIURIL ) 25 MG tablet Take 1 tablet (25 mg total) by mouth daily. 90 tablet 2   ibuprofen  (ADVIL ) 200 MG tablet Take 200-800 mg by mouth every 6 (six) hours as needed for moderate pain (pain score 4-6).     nystatin  cream (MYCOSTATIN ) Apply 1 application topically 2 (two) times daily under breast 30 g 3   pantoprazole  (PROTONIX ) 40 MG tablet Take 1 tablet (40 mg total) by mouth daily before breakfast. 30 tablet 2   polyethylene glycol powder (GLYCOLAX /MIRALAX ) 17 GM/SCOOP powder Take 17 g by mouth daily for 5 days prior  to colonoscopy. (Patient taking differently: Take 119 g by mouth daily as needed for moderate constipation.) 238 g 3   sertraline  (ZOLOFT ) 25 MG tablet Take 1 tablet (25 mg total) by mouth daily. 30 tablet 3   Multiple Vitamins-Minerals (CERTAVITE/ANTIOXIDANTS) TABS Take 1 tablet by mouth daily. 90 tablet 3   Current Facility-Administered Medications  Medication Dose Route Frequency Provider Last Rate Last Admin   0.9 %  sodium chloride  infusion  500 mL Intravenous Once Mansouraty, Ninamarie Keel Jr., MD        Current Outpatient Medications:    acetaminophen  (TYLENOL ) 500 MG tablet, Take 500-1,000 mg by mouth every 6 (six) hours as needed for moderate pain (pain score 4-6)., Disp: , Rfl:    cetirizine  (ZYRTEC ) 10 MG tablet, Take 1 tablet (10 mg total) by mouth daily., Disp: 90 tablet, Rfl: 3   cholecalciferol (VITAMIN D3) 25 MCG (1000 UNIT) tablet, Take 2,000 Units by mouth daily., Disp: , Rfl:    docusate sodium (COLACE) 100 MG capsule, Take 100 mg by mouth daily as needed for mild constipation. as needed, Disp: , Rfl:    Drospirenone  (SLYND ) 4 MG TABS, Take 1 tablet (4 mg total) by mouth daily., Disp: 28 tablet, Rfl: 2   hydrochlorothiazide  (HYDRODIURIL ) 25 MG tablet, Take 1 tablet (25 mg total) by mouth daily., Disp: 90 tablet, Rfl: 2   ibuprofen  (ADVIL )  200 MG tablet, Take 200-800 mg by mouth every 6 (six) hours as needed for moderate pain (pain score 4-6)., Disp: , Rfl:    nystatin  cream (MYCOSTATIN ), Apply 1 application topically 2 (two) times daily under breast, Disp: 30 g, Rfl: 3   pantoprazole  (PROTONIX ) 40 MG tablet, Take 1 tablet (40 mg total) by mouth daily before breakfast., Disp: 30 tablet, Rfl: 2   polyethylene glycol powder (GLYCOLAX /MIRALAX ) 17 GM/SCOOP powder, Take 17 g by mouth daily for 5 days prior to colonoscopy. (Patient taking differently: Take 119 g by mouth daily as needed for moderate constipation.), Disp: 238 g, Rfl: 3   sertraline  (ZOLOFT ) 25 MG tablet, Take 1 tablet (25 mg  total) by mouth daily., Disp: 30 tablet, Rfl: 3   Multiple Vitamins-Minerals (CERTAVITE/ANTIOXIDANTS) TABS, Take 1 tablet by mouth daily., Disp: 90 tablet, Rfl: 3  Current Facility-Administered Medications:    0.9 %  sodium chloride  infusion, 500 mL, Intravenous, Once, Mansouraty, Aloha Raddle., MD Allergies  Allergen Reactions   Latex Rash   Penicillins Rash   Family History  Problem Relation Age of Onset   Hypertension Mother    Stroke Mother    Pulmonary embolism Mother    Hyperlipidemia Mother    Hypertension Brother    Hyperlipidemia Brother    Hyperlipidemia Maternal Grandmother    Hypertension Maternal Grandmother    Asthma Daughter    Diabetes Maternal Aunt    Colon cancer Neg Hx    Esophageal cancer Neg Hx    Rectal cancer Neg Hx    Stomach cancer Neg Hx    Social History   Socioeconomic History   Marital status: Single    Spouse name: Not on file   Number of children: Not on file   Years of education: Not on file   Highest education level: Associate degree: occupational, Scientist, product/process development, or vocational program  Occupational History   Not on file  Tobacco Use   Smoking status: Never   Smokeless tobacco: Never  Vaping Use   Vaping status: Never Used  Substance and Sexual Activity   Alcohol use: Yes    Alcohol/week: 0.0 standard drinks of alcohol    Comment: occasional   Drug use: No   Sexual activity: Yes    Partners: Male  Other Topics Concern   Not on file  Social History Narrative   Work or School: Psychologist, sport and exercise, [employer], [employer]      Home Situation: lives with 3 kids (17,15, 9)      Spiritual Beliefs: none      Lifestyle: diet is so so; has been trying to exercising            Social Drivers of Health   Financial Resource Strain: Medium Risk (07/03/2022)   Overall Financial Resource Strain (CARDIA)    Difficulty of Paying Living Expenses: Somewhat hard  Food Insecurity: No Food Insecurity (05/18/2024)   Hunger Vital Sign    Worried About Running  Out of Food in the Last Year: Never true    Ran Out of Food in the Last Year: Never true  Transportation Needs: No Transportation Needs (05/18/2024)   PRAPARE - Administrator, Civil Service (Medical): No    Lack of Transportation (Non-Medical): No  Physical Activity: Insufficiently Active (07/03/2022)   Exercise Vital Sign    Days of Exercise per Week: 3 days    Minutes of Exercise per Session: 30 min  Stress: Stress Concern Present (07/03/2022)   [employer] of Occupational  Health - Occupational Stress Questionnaire    Feeling of Stress : To some extent  Social Connections: Unknown (07/03/2022)   Social Connection and Isolation Panel    Frequency of Communication with Friends and Family: More than three times a week    Frequency of Social Gatherings with Friends and Family: Once a week    Attends Religious Services: Patient declined    Database administrator or Organizations: No    Attends Engineer, structural: Not on file    Marital Status: Never married  Intimate Partner Violence: Not At Risk (05/18/2024)   Humiliation, Afraid, Rape, and Kick questionnaire    Fear of Current or Ex-Partner: No    Emotionally Abused: No    Physically Abused: No    Sexually Abused: No    Physical Exam: Today's Vitals   07/10/24 1409  BP: 115/75  Pulse: 60  Temp: 97.9 F (36.6 C)  TempSrc: Skin  SpO2: 100%  Weight: 195 lb (88.5 kg)  Height: 5' 4 (1.626 m)   Body mass index is 33.47 kg/m. GEN: NAD EYE: Sclerae anicteric ENT: MMM CV: Non-tachycardic GI: Soft, NT/ND NEURO:  Alert & Oriented x 3  Lab Results: No results for input(s): WBC, HGB, HCT, PLT in the last 72 hours. BMET No results for input(s): NA, K, CL, CO2, GLUCOSE, BUN, CREATININE, CALCIUM in the last 72 hours. LFT No results for input(s): PROT, ALBUMIN, AST, ALT, ALKPHOS, BILITOT, BILIDIR, IBILI in the last 72 hours. PT/INR No results for input(s): LABPROT,  INR in the last 72 hours.   Impression / Plan: This is a 43 y.o.female who presents for Colonoscopy for screening.  The risks and benefits of endoscopic evaluation/treatment were discussed with the patient and/or family; these include but are not limited to the risk of perforation, infection, bleeding, missed lesions, lack of diagnosis, severe illness requiring hospitalization, as well as anesthesia and sedation related illnesses.  The patient's history has been reviewed, patient examined, no change in status, and deemed stable for procedure.  The patient and/or family is agreeable to proceed.    Aloha Finner, MD [facility] Advanced [facility]oscopy Office # [phone]

## 2024-07-10 NOTE — Progress Notes (Signed)
 Pt's states no medical or surgical changes since previsit or office visit.

## 2024-07-10 NOTE — Progress Notes (Signed)
 Report given to PACU, vss

## 2024-07-10 NOTE — Patient Instructions (Signed)
   Handouts on polyps,diverticulosis,& hemorrhoids given to you today.   Await pathology results on polyps removed    Continue previous diet & medications  High fiber diet   Use FiberCon 1-2 tabs by mouth daily    YOU HAD AN ENDOSCOPIC PROCEDURE TODAY AT THE [facility]:   Refer to the procedure report that was given to you for any specific questions about what was found during the examination.  If the procedure report does not answer your questions, please call your gastroenterologist to clarify.  If you requested that your care partner not be given the details of your procedure findings, then the procedure report has been included in a sealed envelope for you to review at your convenience later.  YOU SHOULD EXPECT: Some feelings of bloating in the abdomen. Passage of more gas than usual.  Walking can help get rid of the air that was put into your GI tract during the procedure and reduce the bloating. If you had a lower endoscopy (such as a colonoscopy or flexible sigmoidoscopy) you may notice spotting of blood in your stool or on the toilet paper. If you underwent a bowel prep for your procedure, you may not have a normal bowel movement for a few days.  Please Note:  You might notice some irritation and congestion in your nose or some drainage.  This is from the oxygen used during your procedure.  There is no need for concern and it should clear up in a day or so.  SYMPTOMS TO REPORT IMMEDIATELY:  Following lower endoscopy (colonoscopy or flexible sigmoidoscopy):  Excessive amounts of blood in the stool  Significant tenderness or worsening of abdominal pains  Swelling of the abdomen that is new, acute  Fever of 100F or higher   For urgent or emergent issues, a gastroenterologist can be reached at any hour by calling [phone]. Do not use MyChart messaging for urgent concerns.    DIET:  We do recommend a small meal at first, but then you may proceed to your regular  diet.  Drink plenty of fluids but you should avoid alcoholic beverages for 24 hours.  ACTIVITY:  You should plan to take it easy for the rest of today and you should NOT DRIVE or use heavy machinery until tomorrow (because of the sedation medicines used during the test).    FOLLOW UP: Our staff will call the number listed on your records the next business day following your procedure.  We will call around 7:15- 8:00 am to check on you and address any questions or concerns that you may have regarding the information given to you following your procedure. If we do not reach you, we will leave a message.     If any biopsies were taken you will be contacted by phone or by letter within the next 1-3 weeks.  Please call us  at [phone] if you have not heard about the biopsies in 3 weeks.    SIGNATURES/CONFIDENTIALITY: You and/or your care partner have signed paperwork which will be entered into your electronic medical record.  These signatures attest to the fact that that the information above on your After Visit Summary has been reviewed and is understood.  Full responsibility of the confidentiality of this discharge information lies with you and/or your care-partner.

## 2024-07-10 NOTE — Op Note (Signed)
 [facility] Patient Name: Ashley Summers Procedure Date: 07/10/2024 2:56 PM MRN: [id number] Endoscopist: Aloha Finner , MD, [id number] Age: 43 Referring MD:  Date of Birth: 01-31-1981 Gender: Female Account #: [id number] Procedure:                Colonoscopy Indications:              Screening for colorectal malignant neoplasm, This                            is the patient's first colonoscopy Medicines:                Monitored Anesthesia Care Procedure:                Pre-Anesthesia Assessment:                           - Prior to the procedure, a History and Physical                            was performed, and patient medications and                            allergies were reviewed. The patient's tolerance of                            previous anesthesia was also reviewed. The risks                            and benefits of the procedure and the sedation                            options and risks were discussed with the patient.                            All questions were answered, and informed consent                            was obtained. Prior Anticoagulants: The patient has                            taken no anticoagulant or antiplatelet agents                            except for NSAID medication. ASA Grade Assessment:                            II - A patient with mild systemic disease. After                            reviewing the risks and benefits, the patient was                            deemed in satisfactory condition to undergo the  procedure.                           After obtaining informed consent, the colonoscope                            was passed under direct vision. Throughout the                            procedure, the patient's blood pressure, pulse, and                            oxygen saturations were monitored continuously. The                            CF [id number] #[id number] was introduced through  the anus                            and advanced to the the cecum, identified by                            appendiceal orifice and ileocecal valve. The                            colonoscopy was performed without difficulty. The                            patient tolerated the procedure. The quality of the                            bowel preparation was adequate. The ileocecal                            valve, appendiceal orifice, and rectum were                            photographed. Scope In: 3:11:23 PM Scope Out: 3:24:34 PM Scope Withdrawal Time: 0 hours 10 minutes 9 seconds  Total Procedure Duration: 0 hours 13 minutes 11 seconds  Findings:                 Skin tags were found on perianal exam.                           The digital rectal exam findings include                            hemorrhoids. Pertinent negatives include no                            palpable rectal lesions.                           Two sessile polyps were found in the recto-sigmoid  colon. The polyps were 2 to 3 mm in size. These                            polyps were removed with a cold snare. Resection                            and retrieval were complete.                           Multiple small-mouthed diverticula were found in                            the entire colon.                           Normal mucosa was found in the entire colon                            otherwise.                           Non-bleeding non-thrombosed external and internal                            hemorrhoids were found during retroflexion, during                            perianal exam and during digital exam. The                            hemorrhoids were Grade II (internal hemorrhoids                            that prolapse but reduce spontaneously). Complications:            No immediate complications. Estimated Blood Loss:     Estimated blood loss was minimal. Impression:                - Perianal skin tags found on perianal exam.                           - Hemorrhoids found on digital rectal exam.                           - Two 2 to 3 mm polyps at the recto-sigmoid colon,                            removed with a cold snare. Resected and retrieved.                           - Diverticulosis in the entire examined colon.                           - Normal mucosa in the entire examined colon  otherwise.                           - Non-bleeding non-thrombosed external and internal                            hemorrhoids. Recommendation:           - The patient will be observed post-procedure,                            until all discharge criteria are met.                           - Discharge patient to home.                           - Patient has a contact number available for                            emergencies. The signs and symptoms of potential                            delayed complications were discussed with the                            patient. Return to normal activities tomorrow.                            Written discharge instructions were provided to the                            patient.                           - High fiber diet.                           - Use FiberCon 1-2 tablets PO daily.                           - Continue present medications.                           - Await pathology results.                           - Repeat colonoscopy in 5-10 years for surveillance                            based on pathology results and findings of                            adenomatous tissue.                           - The findings and recommendations were discussed  with the patient.                           - The findings and recommendations were discussed                            with the designated responsible adult. Aloha Finner, MD 07/10/2024 3:28:39 PM

## 2024-07-13 ENCOUNTER — Telehealth: Payer: Self-pay

## 2024-07-13 NOTE — Telephone Encounter (Signed)
  Follow up Call-     07/10/2024    2:15 PM  Call back number  Post procedure Call Back phone  # [phone]  Permission to leave phone message Yes     Patient questions:  Do you have a fever, pain , or abdominal swelling? No. Pain Score  0 *  Have you tolerated food without any problems? Yes.    Have you been able to return to your normal activities? Yes.    Do you have any questions about your discharge instructions: Diet   No. Medications  No. Follow up visit  No.  Do you have questions or concerns about your Care? No.  Actions: * If pain score is 4 or above: No action needed, pain <4.

## 2024-07-15 ENCOUNTER — Encounter: Admitting: Gastroenterology

## 2024-07-15 LAB — SURGICAL PATHOLOGY

## 2024-07-16 ENCOUNTER — Other Ambulatory Visit: Payer: Self-pay

## 2024-07-16 ENCOUNTER — Other Ambulatory Visit: Payer: Self-pay | Admitting: Family Medicine

## 2024-07-16 ENCOUNTER — Other Ambulatory Visit (HOSPITAL_COMMUNITY): Payer: Self-pay

## 2024-07-16 DIAGNOSIS — K219 Gastro-esophageal reflux disease without esophagitis: Secondary | ICD-10-CM

## 2024-07-17 ENCOUNTER — Ambulatory Visit: Payer: Self-pay | Admitting: Gastroenterology

## 2024-07-20 ENCOUNTER — Other Ambulatory Visit (HOSPITAL_COMMUNITY): Payer: Self-pay

## 2024-07-20 MED ORDER — PANTOPRAZOLE SODIUM 40 MG PO TBEC
40.0000 mg | DELAYED_RELEASE_TABLET | Freq: Every day | ORAL | 0 refills | Status: DC
  Filled 2024-07-20: qty 30, 30d supply, fill #0

## 2024-07-22 ENCOUNTER — Other Ambulatory Visit (HOSPITAL_COMMUNITY): Payer: Self-pay

## 2024-07-22 ENCOUNTER — Telehealth (INDEPENDENT_AMBULATORY_CARE_PROVIDER_SITE_OTHER): Admitting: Family Medicine

## 2024-07-22 DIAGNOSIS — F411 Generalized anxiety disorder: Secondary | ICD-10-CM

## 2024-07-22 MED ORDER — BUPROPION HCL 75 MG PO TABS
75.0000 mg | ORAL_TABLET | Freq: Two times a day (BID) | ORAL | 1 refills | Status: DC
  Filled 2024-07-22: qty 60, 30d supply, fill #0
  Filled 2024-08-17: qty 60, 30d supply, fill #1

## 2024-07-22 NOTE — Progress Notes (Signed)
 Virtual Visit via Video Note  I connected with  Ashley Summers on 07/22/24 at  4:30 PM EDT by a video enabled telemedicine application and verified that I am speaking with the correct person using two identifiers.  Location patient: home Location provider:work or home office Persons participating in the virtual visit: patient, provider  I discussed the limitations of evaluation and management by telemedicine and the availability of in person appointments. The patient expressed understanding and agreed to proceed. Chief Complaint  Patient presents with   Medical Management of Chronic Issues    Medication Follow-up     HPI: Pt is a 43 yo female with pmh sig for HTN, OSA, GAD and palpitations seen for f/u on anxiety. Zoloft  helped in am but not at night in the beginning.  Pt notes being up at night with increased thoughts/mind wondering. Easily annoyed with people at work.  Decreased libido. Appetite decreased but welcomes the wt loss. Initially had lose stools but they improved after being on med for a wk or so. Pt notes some increased stress as her grandson was in the hospital for 9 days.  He is now home recovering from [facility].  Pt also took her daughter back to school. Pt does not have much time for self care.  Was walking in the afternoon with her partner.  Has a journal and books to read, but hasn't had time.  ROS: See pertinent positives and negatives per HPI.  Past Medical History:  Diagnosis Date   Allergy    Anxiety    Essential hypertension 09/09/2020   GERD (gastroesophageal reflux disease)    Hypertension    Palpitations 09/09/2020    Past Surgical History:  Procedure Laterality Date   BREAST SURGERY     Reduction    CESAREAN SECTION     COMBINED ABDOMINOPLASTY AND LIPOSUCTION     2023   COSMETIC SURGERY  05/10/22   Hammertoe correction     LAPAROSCOPIC APPENDECTOMY N/A 05/19/2024   Procedure: APPENDECTOMY, LAPAROSCOPIC;  Surgeon: Ebbie Cough, MD;   Location: [facility] OR;  Service: General;  Laterality: N/A;   REDUCTION MAMMAPLASTY      Family History  Problem Relation Age of Onset   Hypertension Mother    Stroke Mother    Pulmonary embolism Mother    Hyperlipidemia Mother    Hypertension Brother    Hyperlipidemia Brother    Hyperlipidemia Maternal Grandmother    Hypertension Maternal Grandmother    Asthma Daughter    Diabetes Maternal Aunt    Colon cancer Neg Hx    Esophageal cancer Neg Hx    Rectal cancer Neg Hx    Stomach cancer Neg Hx     Current Outpatient Medications:    acetaminophen  (TYLENOL ) 500 MG tablet, Take 500-1,000 mg by mouth every 6 (six) hours as needed for moderate pain (pain score 4-6)., Disp: , Rfl:    cetirizine  (ZYRTEC ) 10 MG tablet, Take 1 tablet (10 mg total) by mouth daily., Disp: 90 tablet, Rfl: 3   cholecalciferol (VITAMIN D3) 25 MCG (1000 UNIT) tablet, Take 2,000 Units by mouth daily., Disp: , Rfl:    docusate sodium (COLACE) 100 MG capsule, Take 100 mg by mouth daily as needed for mild constipation. as needed, Disp: , Rfl:    Drospirenone  (SLYND ) 4 MG TABS, Take 1 tablet (4 mg total) by mouth daily., Disp: 28 tablet, Rfl: 2   hydrochlorothiazide  (HYDRODIURIL ) 25 MG tablet, Take 1 tablet (25 mg total) by mouth daily., Disp: 90  tablet, Rfl: 2   ibuprofen  (ADVIL ) 200 MG tablet, Take 200-800 mg by mouth every 6 (six) hours as needed for moderate pain (pain score 4-6)., Disp: , Rfl:    Multiple Vitamins-Minerals (CERTAVITE/ANTIOXIDANTS) TABS, Take 1 tablet by mouth daily., Disp: 90 tablet, Rfl: 3   nystatin  cream (MYCOSTATIN ), Apply 1 application topically 2 (two) times daily under breast, Disp: 30 g, Rfl: 3   pantoprazole  (PROTONIX ) 40 MG tablet, Take 1 tablet (40 mg total) by mouth daily before breakfast., Disp: 30 tablet, Rfl: 0   polyethylene glycol powder (GLYCOLAX /MIRALAX ) 17 GM/SCOOP powder, Take 17 g by mouth daily for 5 days prior to colonoscopy. (Patient taking differently: Take 119 g by mouth daily  as needed for moderate constipation.), Disp: 238 g, Rfl: 3   sertraline  (ZOLOFT ) 25 MG tablet, Take 1 tablet (25 mg total) by mouth daily., Disp: 30 tablet, Rfl: 3  EXAM:  VITALS per patient if applicable:  RR between 12-20 bpm  GENERAL: alert, oriented, appears well and in no acute distress  HEENT: atraumatic, conjunctiva clear, no obvious abnormalities on inspection of external nose and ears  NECK: normal movements of the head and neck  LUNGS: on inspection no signs of respiratory distress, breathing rate appears normal, no obvious gross SOB, gasping or wheezing  CV: no obvious cyanosis  MS: moves all visible extremities without noticeable abnormality  PSYCH/NEURO: pleasant and cooperative, no obvious depression or anxiety, speech and thought processing grossly intact     07/22/2024    4:37 PM 05/27/2024    4:25 PM 12/18/2023    3:50 PM 11/13/2023    8:17 AM  GAD 7 : Generalized Anxiety Score  Nervous, Anxious, on Edge 3 3 2 3   Control/stop worrying 3 3 2 3   Worry too much - different things 3 3 2 3   Trouble relaxing 0 2 1 1   Restless 0 3 0 0  Easily annoyed or irritable 2 1 0 1  Afraid - awful might happen 3 3 3 3   Total GAD 7 Score 14 18 10 14        07/22/2024    4:36 PM 05/27/2024    4:23 PM 02/27/2024    2:36 PM  Depression screen PHQ 2/9  Decreased Interest 0 0 0  Down, Depressed, Hopeless 0 0 0  PHQ - 2 Score 0 0 0  Altered sleeping 3 1   Tired, decreased energy 0 0   Change in appetite 0 0   Feeling bad or failure about yourself  0 0   Trouble concentrating 0 0   Moving slowly or fidgety/restless 0 0   Suicidal thoughts 0 0   PHQ-9 Score 3 1     ASSESSMENT AND PLAN:  Discussed the following assessment and plan:  GAD (generalized anxiety disorder) - Plan: buPROPion  (WELLBUTRIN ) 75 MG tablet  GAD-7 score 14, PHQ-9 score 3 this visit.  Will DC Zoloft  at patient's request due to decreased libido and racing thoughts at night causing insomnia.  Start  Wellbutrin  75 mg twice daily.  If medication well-tolerated switch to XL version.  Discussed the importance of self-care.  Consider counseling.  Follow-up in 4-6 weeks, sooner if needed.   I discussed the assessment and treatment plan with the patient. The patient was provided an opportunity to ask questions and all were answered. The patient agreed with the plan and demonstrated an understanding of the instructions.   The patient was advised to call back or seek an in-person evaluation if the  symptoms worsen or if the condition fails to improve as anticipated.  Follow-up in 4-6 weeks, sooner if needed Clotilda JONELLE Single, MD

## 2024-08-15 ENCOUNTER — Other Ambulatory Visit: Payer: Self-pay | Admitting: Family Medicine

## 2024-08-15 ENCOUNTER — Other Ambulatory Visit: Payer: Self-pay | Admitting: Nurse Practitioner

## 2024-08-15 DIAGNOSIS — K219 Gastro-esophageal reflux disease without esophagitis: Secondary | ICD-10-CM

## 2024-08-15 DIAGNOSIS — Z3041 Encounter for surveillance of contraceptive pills: Secondary | ICD-10-CM

## 2024-08-17 ENCOUNTER — Other Ambulatory Visit: Payer: Self-pay

## 2024-08-17 ENCOUNTER — Other Ambulatory Visit (HOSPITAL_COMMUNITY): Payer: Self-pay

## 2024-08-17 ENCOUNTER — Encounter: Payer: Self-pay | Admitting: Nurse Practitioner

## 2024-08-17 MED ORDER — SLYND 4 MG PO TABS
1.0000 | ORAL_TABLET | Freq: Every day | ORAL | 2 refills | Status: AC
  Filled 2024-08-17: qty 84, 84d supply, fill #0
  Filled 2024-11-04: qty 84, 84d supply, fill #1

## 2024-08-17 NOTE — Telephone Encounter (Signed)
.  Med refill request: Slynd  Last AEX: 02/27/24 Next AEX: 03/03/25 Last MMG (if hormonal med) 12/02/23 Refill authorized: Please Advise?   ** pt is requesting a 90 day supply I did update the script to reflect a 90 day supply and did 2 refills.   Please advise if this is ok

## 2024-08-18 ENCOUNTER — Other Ambulatory Visit (HOSPITAL_BASED_OUTPATIENT_CLINIC_OR_DEPARTMENT_OTHER): Payer: Self-pay

## 2024-08-18 ENCOUNTER — Other Ambulatory Visit (HOSPITAL_COMMUNITY): Payer: Self-pay

## 2024-08-19 ENCOUNTER — Other Ambulatory Visit (HOSPITAL_COMMUNITY): Payer: Self-pay

## 2024-08-19 MED ORDER — PANTOPRAZOLE SODIUM 40 MG PO TBEC
40.0000 mg | DELAYED_RELEASE_TABLET | Freq: Every day | ORAL | 0 refills | Status: DC
Start: 2024-08-19 — End: 2024-10-12
  Filled 2024-08-19: qty 30, 30d supply, fill #0

## 2024-08-21 ENCOUNTER — Other Ambulatory Visit (HOSPITAL_COMMUNITY): Payer: Self-pay

## 2024-08-21 ENCOUNTER — Other Ambulatory Visit: Payer: Self-pay | Admitting: Family Medicine

## 2024-08-21 ENCOUNTER — Encounter: Payer: Self-pay | Admitting: Family Medicine

## 2024-08-21 DIAGNOSIS — F411 Generalized anxiety disorder: Secondary | ICD-10-CM

## 2024-08-22 ENCOUNTER — Encounter (HOSPITAL_COMMUNITY): Payer: Self-pay

## 2024-08-23 ENCOUNTER — Other Ambulatory Visit (HOSPITAL_COMMUNITY): Payer: Self-pay

## 2024-08-28 ENCOUNTER — Encounter: Payer: Self-pay | Admitting: Family Medicine

## 2024-08-28 ENCOUNTER — Ambulatory Visit: Admitting: Family Medicine

## 2024-08-28 ENCOUNTER — Other Ambulatory Visit (HOSPITAL_COMMUNITY): Payer: Self-pay

## 2024-08-28 VITALS — BP 122/80 | HR 75 | Temp 98.4°F | Ht 64.0 in | Wt 191.0 lb

## 2024-08-28 DIAGNOSIS — G4700 Insomnia, unspecified: Secondary | ICD-10-CM | POA: Diagnosis not present

## 2024-08-28 DIAGNOSIS — I10 Essential (primary) hypertension: Secondary | ICD-10-CM

## 2024-08-28 DIAGNOSIS — F411 Generalized anxiety disorder: Secondary | ICD-10-CM

## 2024-08-28 MED ORDER — BUPROPION HCL ER (XL) 150 MG PO TB24
150.0000 mg | ORAL_TABLET | Freq: Every day | ORAL | 1 refills | Status: AC
  Filled 2024-08-28: qty 90, 90d supply, fill #0
  Filled 2024-11-24: qty 90, 90d supply, fill #1

## 2024-08-28 NOTE — Progress Notes (Signed)
 Established Patient Office Visit   Subjective  Patient ID: Ashley Summers, female    DOB: Nov 12, 1981  Age: 43 y.o. MRN: [id number]  Chief Complaint  Patient presents with   Medical Management of Chronic Issues    Patient came in today for Anxiety follow-up     Pt is a 43 yo female seen for f/u on GAD.  Started on Wellbutrin  75 mg BID.  Pt states taking the second dose of medication at 6 pm caused worsened insomnia.  Pt was up as mind was racing.  Started taking both tabs around 7 am for the last 2 wks.  Pt notes improvement in anxiety symptoms with Wellbutrin .  States appetite has decreased some but wt still fluctuating in the 180s-190s.  Pt walking for exercise daily.    Patient Active Problem List   Diagnosis Date Noted   Appendicitis, acute 05/18/2024   Acute appendicitis 05/18/2024   Panniculitis 09/20/2020   Essential hypertension 09/09/2020   Palpitations 09/09/2020   OSA (obstructive sleep apnea) 04/23/2018   Hemorrhoid 02/03/2016   Past Medical History:  Diagnosis Date   Allergy    Anxiety    Essential hypertension 09/09/2020   GERD (gastroesophageal reflux disease)    Hypertension    Palpitations 09/09/2020   Past Surgical History:  Procedure Laterality Date   BREAST SURGERY     Reduction    CESAREAN SECTION     COMBINED ABDOMINOPLASTY AND LIPOSUCTION     2023   COSMETIC SURGERY  05/10/22   Hammertoe correction     LAPAROSCOPIC APPENDECTOMY N/A 05/19/2024   Procedure: APPENDECTOMY, LAPAROSCOPIC;  Surgeon: Ebbie Cough, MD;  Location: [facility] OR;  Service: General;  Laterality: N/A;   REDUCTION MAMMAPLASTY     Social History   Tobacco Use   Smoking status: Never   Smokeless tobacco: Never  Vaping Use   Vaping status: Never Used  Substance Use Topics   Alcohol use: Yes    Alcohol/week: 0.0 standard drinks of alcohol    Comment: occasional   Drug use: No   Family History  Problem Relation Age of Onset   Hypertension Mother    Stroke Mother     Pulmonary embolism Mother    Hyperlipidemia Mother    Hypertension Brother    Hyperlipidemia Brother    Hyperlipidemia Maternal Grandmother    Hypertension Maternal Grandmother    Asthma Daughter    Diabetes Maternal Aunt    Colon cancer Neg Hx    Esophageal cancer Neg Hx    Rectal cancer Neg Hx    Stomach cancer Neg Hx    Allergies  Allergen Reactions   Latex Rash   Penicillins Rash    ROS Negative unless stated above    Objective:     BP 122/80 (BP Location: Left Arm, Patient Position: Sitting, Cuff Size: Normal)   Pulse 75   Temp 98.4 F (36.9 C) (Oral)   Ht 5' 4 (1.626 m)   Wt 191 lb (86.6 kg)   SpO2 98%   BMI 32.79 kg/m  BP Readings from Last 3 Encounters:  08/28/24 122/80  07/10/24 127/81  05/22/24 130/80   Wt Readings from Last 3 Encounters:  08/28/24 191 lb (86.6 kg)  07/10/24 195 lb (88.5 kg)  05/22/24 193 lb (87.5 kg)      Physical Exam Constitutional:      Appearance: Normal appearance.  HENT:     Head: Normocephalic and atraumatic.     Nose: Nose normal.  Cardiovascular:  Rate and Rhythm: Normal rate.  Pulmonary:     Effort: Pulmonary effort is normal.  Skin:    General: Skin is warm and dry.  Neurological:     Mental Status: She is alert and oriented to person, place, and time.  Psychiatric:        Mood and Affect: Mood normal.        Behavior: Behavior normal.        08/28/2024    1:31 PM 07/22/2024    4:36 PM 05/27/2024    4:23 PM  Depression screen PHQ 2/9  Decreased Interest 0 0 0  Down, Depressed, Hopeless 0 0 0  PHQ - 2 Score 0 0 0  Altered sleeping 1 3 1   Tired, decreased energy 0 0 0  Change in appetite 0 0 0  Feeling bad or failure about yourself  0 0 0  Trouble concentrating 1 0 0  Moving slowly or fidgety/restless 0 0 0  Suicidal thoughts 0 0 0  PHQ-9 Score 2 3 1   Difficult doing work/chores Not difficult at all        08/28/2024    1:30 PM 07/22/2024    4:37 PM 05/27/2024    4:25 PM 12/18/2023    3:50  PM  GAD 7 : Generalized Anxiety Score  Nervous, Anxious, on Edge 1 3 3 2   Control/stop worrying 1 3 3 2   Worry too much - different things 1 3 3 2   Trouble relaxing 0 0 2 1  Restless 0 0 3 0  Easily annoyed or irritable 1 2 1  0  Afraid - awful might happen 1 3 3 3   Total GAD 7 Score 5 14 18 10   Anxiety Difficulty Not difficult at all        No results found for any visits on 08/28/24.    Assessment & Plan:   GAD (generalized anxiety disorder) -     buPROPion  HCl ER (XL); Take 1 tablet (150 mg total) by mouth daily.  Dispense: 90 tablet; Refill: 1  Insomnia, unspecified type  Essential hypertension  Improvement in anxiety since starting Wellbutrin .  Will switch from immediate release to Wellbutrin  XL 150 mg daily as twice daily dosing caused insomnia with second dose.  PHQ-9 score 2 this visit, previously 3 on 07/22/2024.  GAD-7 score 5 this visit, previously 14 on 07/22/2024.  Self-care and sleep hygiene encouraged.  Follow-up in 6 to 8 weeks, sooner if needed  BP improving with recent weight loss.  Continue lifestyle modifications.  Continue HCTZ 25 mg daily.   Return in about 2 months (around 10/28/2024).   Clotilda JONELLE Single, MD

## 2024-09-23 ENCOUNTER — Other Ambulatory Visit: Payer: Self-pay | Admitting: Medical Genetics

## 2024-09-23 DIAGNOSIS — Z006 Encounter for examination for normal comparison and control in clinical research program: Secondary | ICD-10-CM

## 2024-09-29 ENCOUNTER — Ambulatory Visit: Admitting: Family Medicine

## 2024-09-29 ENCOUNTER — Encounter: Payer: Self-pay | Admitting: Family Medicine

## 2024-09-29 VITALS — BP 126/80 | HR 80 | Temp 98.4°F | Wt 189.9 lb

## 2024-09-29 DIAGNOSIS — J069 Acute upper respiratory infection, unspecified: Secondary | ICD-10-CM

## 2024-09-29 DIAGNOSIS — J029 Acute pharyngitis, unspecified: Secondary | ICD-10-CM | POA: Diagnosis not present

## 2024-09-29 LAB — POCT RAPID STREP A (OFFICE): Rapid Strep A Screen: NEGATIVE

## 2024-09-29 LAB — POC COVID19 BINAXNOW: SARS Coronavirus 2 Ag: NEGATIVE

## 2024-09-29 NOTE — Progress Notes (Signed)
 Established Patient Office Visit  Subjective   Patient ID: Ashley Summers, female    DOB: [birth year]  Age: 44 y.o. MRN: [id number]  Chief Complaint  Patient presents with   Cough   Sore Throat   Ear Pain    HPI   Ashley Summers is seen with onset yesterday morning of severe sore throat and some nasal congestion.  She has had some bilateral ear pressure.  Minimal cough.  No documented fever.  She has had some increased malaise.  Minimal bodyaches.  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  She denies any nausea, vomiting, or diarrhea.  Past Medical History:  Diagnosis Date   Allergy    Anxiety    Essential hypertension 09/09/2020   GERD (gastroesophageal reflux disease)    Hypertension    Palpitations 09/09/2020   Past Surgical History:  Procedure Laterality Date   BREAST SURGERY     Reduction    CESAREAN SECTION     COMBINED ABDOMINOPLASTY AND LIPOSUCTION     2023   COSMETIC SURGERY  05/10/22   Hammertoe correction     LAPAROSCOPIC APPENDECTOMY N/A 05/19/2024   Procedure: APPENDECTOMY, LAPAROSCOPIC;  Surgeon: Ebbie Cough, MD;  Location: [facility] OR;  Service: General;  Laterality: N/A;   REDUCTION MAMMAPLASTY      reports that she has never smoked. She has never used smokeless tobacco. She reports current alcohol use. She reports that she does not use drugs. family history includes Asthma in her daughter; Diabetes in her maternal aunt; Hyperlipidemia in her brother, maternal grandmother, and mother; Hypertension in her brother, maternal grandmother, and mother; Pulmonary embolism in her mother; Stroke in her mother. Allergies  Allergen Reactions   Latex Rash   Penicillins Rash    Review of Systems  Constitutional:  Negative for chills and fever.  HENT:  Positive for congestion and sore throat. Negative for ear discharge.       Objective:     BP 126/80   Pulse 80   Temp 98.4 F (36.9 C) (Oral)   Wt 189 lb 14.4 oz  (86.1 kg)   SpO2 99%   BMI 32.60 kg/m  BP Readings from Last 3 Encounters:  09/29/24 126/80  08/28/24 122/80  07/10/24 127/81   Wt Readings from Last 3 Encounters:  09/29/24 189 lb 14.4 oz (86.1 kg)  08/28/24 191 lb (86.6 kg)  07/10/24 195 lb (88.5 kg)      Physical Exam Vitals reviewed.  Constitutional:      General: She is not in acute distress.    Appearance: She is not ill-appearing.  HENT:     Right Ear: Tympanic membrane normal. No middle ear effusion. Tympanic membrane is not erythematous.     Left Ear: Tympanic membrane normal.  No middle ear effusion. Tympanic membrane is not erythematous.     Mouth/Throat:     Mouth: Mucous membranes are moist.     Pharynx: Oropharynx is clear. No oropharyngeal exudate.  Cardiovascular:     Rate and Rhythm: Normal rate and regular rhythm.  Pulmonary:     Effort: Pulmonary effort is normal.     Breath sounds: No wheezing or rales.  Musculoskeletal:     Cervical back: Neck supple.  Lymphadenopathy:     Cervical: No cervical adenopathy.  Neurological:     Mental Status: She is alert.      Results for orders placed or performed in visit on 09/29/24  POC COVID-19  Result Value Ref Range   SARS Coronavirus 2 Ag Negative Negative  POC Rapid Strep A  Result Value Ref Range   Rapid Strep A Screen Negative Negative      The 10-year ASCVD risk score (Arnett DK, et al., 2019) is: 1.9%    Assessment & Plan:   Problem List Items Addressed This Visit   None Visit Diagnoses       Sore throat    -  Primary   Relevant Orders   POC COVID-19 (Completed)   POC Rapid Strep A (Completed)     Rapid strep and COVID screens negative.  Suspect viral URI.  Plenty of fluids and rest.  Continue over-the-counter medications such as Mucinex.  Follow-up promptly for any fever or worsening symptoms.  No follow-ups on file.    Wolm Scarlet, MD

## 2024-09-29 NOTE — Patient Instructions (Signed)
 Strep and Covid tests are negative.

## 2024-10-12 ENCOUNTER — Other Ambulatory Visit: Payer: Self-pay | Admitting: Family Medicine

## 2024-10-12 ENCOUNTER — Other Ambulatory Visit: Payer: Self-pay

## 2024-10-12 ENCOUNTER — Other Ambulatory Visit (HOSPITAL_COMMUNITY): Payer: Self-pay

## 2024-10-12 DIAGNOSIS — I10 Essential (primary) hypertension: Secondary | ICD-10-CM

## 2024-10-12 DIAGNOSIS — T7840XS Allergy, unspecified, sequela: Secondary | ICD-10-CM

## 2024-10-12 DIAGNOSIS — K219 Gastro-esophageal reflux disease without esophagitis: Secondary | ICD-10-CM

## 2024-10-12 MED ORDER — PANTOPRAZOLE SODIUM 40 MG PO TBEC
40.0000 mg | DELAYED_RELEASE_TABLET | Freq: Every day | ORAL | 0 refills | Status: DC
  Filled 2024-10-12: qty 30, 30d supply, fill #0

## 2024-10-12 MED ORDER — HYDROCHLOROTHIAZIDE 25 MG PO TABS
25.0000 mg | ORAL_TABLET | Freq: Every day | ORAL | 2 refills | Status: AC
  Filled 2024-10-12: qty 90, 90d supply, fill #0
  Filled 2025-01-06: qty 90, 90d supply, fill #1

## 2024-11-02 ENCOUNTER — Other Ambulatory Visit: Payer: Self-pay | Admitting: Family Medicine

## 2024-11-02 DIAGNOSIS — Z1231 Encounter for screening mammogram for malignant neoplasm of breast: Secondary | ICD-10-CM

## 2024-11-04 ENCOUNTER — Other Ambulatory Visit: Payer: Self-pay | Admitting: Family Medicine

## 2024-11-04 DIAGNOSIS — K219 Gastro-esophageal reflux disease without esophagitis: Secondary | ICD-10-CM

## 2024-11-05 ENCOUNTER — Other Ambulatory Visit: Payer: Self-pay

## 2024-11-06 ENCOUNTER — Other Ambulatory Visit (HOSPITAL_COMMUNITY): Payer: Self-pay

## 2024-11-06 MED ORDER — CETIRIZINE HCL 10 MG PO TABS
10.0000 mg | ORAL_TABLET | Freq: Every day | ORAL | 3 refills | Status: AC
  Filled 2024-11-06: qty 90, 90d supply, fill #0

## 2024-11-06 MED ORDER — PANTOPRAZOLE SODIUM 40 MG PO TBEC
40.0000 mg | DELAYED_RELEASE_TABLET | Freq: Every day | ORAL | 0 refills | Status: DC
  Filled 2024-11-06: qty 30, 30d supply, fill #0

## 2024-11-07 ENCOUNTER — Other Ambulatory Visit (HOSPITAL_COMMUNITY): Payer: Self-pay

## 2024-11-13 ENCOUNTER — Encounter: Payer: Self-pay | Admitting: Family Medicine

## 2024-11-13 ENCOUNTER — Ambulatory Visit (INDEPENDENT_AMBULATORY_CARE_PROVIDER_SITE_OTHER): Admitting: Family Medicine

## 2024-11-13 VITALS — BP 124/80 | HR 67 | Temp 98.3°F | Ht 64.0 in | Wt 194.8 lb

## 2024-11-13 DIAGNOSIS — I10 Essential (primary) hypertension: Secondary | ICD-10-CM

## 2024-11-13 DIAGNOSIS — F411 Generalized anxiety disorder: Secondary | ICD-10-CM

## 2024-11-13 DIAGNOSIS — Z0000 Encounter for general adult medical examination without abnormal findings: Secondary | ICD-10-CM | POA: Diagnosis not present

## 2024-11-13 NOTE — Progress Notes (Signed)
 Established Patient Office Visit   Subjective  Patient ID: Ashley Summers, female    DOB: 18-May-1981  Age: 43 y.o. MRN: [id number]  Chief Complaint  Patient presents with   Annual Exam    Patient is a 43 year old female seen for CPE.  Patient is fasting.  States doing well overall.  Taking Wellbutrin  XL 150 mg daily which seems to be helping.  Endorses increased appetite after getting off work causing weight gain.  May snack before dinner.  Does not really eat breakfast.  May forget to eat lunch or snacks during lunch.  may have a protein bar before taking medication.  Walking less for exercise due to the cold weather.    Patient Active Problem List   Diagnosis Date Noted   Appendicitis, acute 05/18/2024   Acute appendicitis 05/18/2024   Panniculitis 09/20/2020   Essential hypertension 09/09/2020   Palpitations 09/09/2020   OSA (obstructive sleep apnea) 04/23/2018   Hemorrhoid 02/03/2016   Past Medical History:  Diagnosis Date   Allergy    Anxiety    Essential hypertension 09/09/2020   GERD (gastroesophageal reflux disease)    Hypertension    Palpitations 09/09/2020   Past Surgical History:  Procedure Laterality Date   BREAST SURGERY     Reduction    CESAREAN SECTION     COMBINED ABDOMINOPLASTY AND LIPOSUCTION     2023   COSMETIC SURGERY  05/10/22   Hammertoe correction     LAPAROSCOPIC APPENDECTOMY N/A 05/19/2024   Procedure: APPENDECTOMY, LAPAROSCOPIC;  Surgeon: Ebbie Cough, MD;  Location: [facility] OR;  Service: General;  Laterality: N/A;   REDUCTION MAMMAPLASTY     Social History[1] Family History  Problem Relation Age of Onset   Hypertension Mother    Stroke Mother    Pulmonary embolism Mother    Hyperlipidemia Mother    Hypertension Brother    Hyperlipidemia Brother    Hyperlipidemia Maternal Grandmother    Hypertension Maternal Grandmother    Asthma Daughter    Diabetes Maternal Aunt    Colon cancer Neg Hx    Esophageal cancer Neg Hx     Rectal cancer Neg Hx    Stomach cancer Neg Hx    Allergies[2]  ROS Negative unless stated above    Objective:     BP 124/80 (BP Location: Left Arm, Patient Position: Sitting)   Pulse 67   Temp 98.3 F (36.8 C) (Oral)   Ht 5' 4 (1.626 m)   Wt 194 lb 12.8 oz (88.4 kg)   SpO2 98%   BMI 33.44 kg/m  BP Readings from Last 3 Encounters:  11/13/24 124/80  09/29/24 126/80  08/28/24 122/80   Wt Readings from Last 3 Encounters:  11/13/24 194 lb 12.8 oz (88.4 kg)  09/29/24 189 lb 14.4 oz (86.1 kg)  08/28/24 191 lb (86.6 kg)      Physical Exam Constitutional:      Appearance: Normal appearance.  HENT:     Head: Normocephalic and atraumatic.     Right Ear: Tympanic membrane, ear canal and external ear normal.     Left Ear: Tympanic membrane, ear canal and external ear normal.     Nose: Nose normal.     Mouth/Throat:     Mouth: Mucous membranes are moist.     Pharynx: No oropharyngeal exudate or posterior oropharyngeal erythema.  Eyes:     General: No scleral icterus.    Extraocular Movements: Extraocular movements intact.     Conjunctiva/sclera: Conjunctivae normal.  Pupils: Pupils are equal, round, and reactive to light.  Neck:     Thyroid : No thyromegaly.     Vascular: No carotid bruit.  Cardiovascular:     Rate and Rhythm: Normal rate and regular rhythm.     Pulses: Normal pulses.     Heart sounds: Normal heart sounds. No murmur heard.    No friction rub.  Pulmonary:     Effort: Pulmonary effort is normal.     Breath sounds: Normal breath sounds. No wheezing, rhonchi or rales.  Abdominal:     General: Bowel sounds are normal.     Palpations: Abdomen is soft.     Tenderness: There is no abdominal tenderness.  Musculoskeletal:        General: No deformity. Normal range of motion.  Lymphadenopathy:     Cervical: No cervical adenopathy.  Skin:    General: Skin is warm and dry.     Findings: No lesion.  Neurological:     General: No focal deficit present.      Mental Status: She is alert and oriented to person, place, and time.  Psychiatric:        Mood and Affect: Mood normal.        Thought Content: Thought content normal.        11/13/2024    8:28 AM 08/28/2024    1:31 PM 07/22/2024    4:36 PM  Depression screen PHQ 2/9  Decreased Interest 0 0 0  Down, Depressed, Hopeless 0 0 0  PHQ - 2 Score 0 0 0  Altered sleeping 0 1 3  Tired, decreased energy 0 0 0  Change in appetite 0 0 0  Feeling bad or failure about yourself  0 0 0  Trouble concentrating 0 1 0  Moving slowly or fidgety/restless 0 0 0  Suicidal thoughts 0 0 0  PHQ-9 Score 0 2  3   Difficult doing work/chores Not difficult at all Not difficult at all      Data saved with a previous flowsheet row definition      11/13/2024    8:29 AM 08/28/2024    1:30 PM 07/22/2024    4:37 PM 05/27/2024    4:25 PM  GAD 7 : Generalized Anxiety Score  Nervous, Anxious, on Edge 0 1 3 3   Control/stop worrying 0 1 3 3   Worry too much - different things 0 1 3 3   Trouble relaxing 0 0 0 2  Restless 0 0 0 3  Easily annoyed or irritable 0 1 2 1   Afraid - awful might happen 0 1 3 3   Total GAD 7 Score 0 5 14 18   Anxiety Difficulty Not difficult at all Not difficult at all       No results found for any visits on 11/13/24.    Assessment & Plan:   Well adult exam -     CBC with Differential/Platelet; Future -     Comprehensive metabolic panel with GFR; Future -     Hemoglobin A1c; Future -     Lipid panel; Future -     T4, free; Future -     TSH; Future  Essential hypertension -     Comprehensive metabolic panel with GFR; Future  GAD (generalized anxiety disorder) -     T4, free; Future -     TSH; Future  Age-appropriate health screenings discussed.  Obtain labs.  Immunizations reviewed.  Pap done with OB/GYN 12/11/2021.  Mammogram scheduled.  Colonoscopy done  07/10/2024.  BP controlled.  Continue HCTZ 25 mg daily.  Monitor potassium.  Start potassium supplement if needed.  Anxiety  improving.  GAD-7 score 0.  PHQ-9 score 0 this visit.  Continue Wellbutrin  XL 150 mg daily.  Counseling and self-care.  Return in about 3 months (around 02/11/2025).   Ashley JONELLE Single, MD    [1]  Social History Tobacco Use   Smoking status: Never   Smokeless tobacco: Never  Vaping Use   Vaping status: Never Used  Substance Use Topics   Alcohol use: Yes    Alcohol/week: 0.0 standard drinks of alcohol    Comment: occasional   Drug use: No  [2]  Allergies Allergen Reactions   Latex Rash   Penicillins Rash

## 2024-12-02 ENCOUNTER — Ambulatory Visit
Admission: RE | Admit: 2024-12-02 | Discharge: 2024-12-02 | Disposition: A | Discharge status: Home / Self Care | Source: Ambulatory Visit | Attending: Family Medicine | Admitting: Family Medicine

## 2024-12-02 ENCOUNTER — Ambulatory Visit

## 2024-12-02 DIAGNOSIS — Z1231 Encounter for screening mammogram for malignant neoplasm of breast: Secondary | ICD-10-CM

## 2024-12-05 ENCOUNTER — Other Ambulatory Visit: Payer: Self-pay | Admitting: Family Medicine

## 2024-12-05 DIAGNOSIS — K219 Gastro-esophageal reflux disease without esophagitis: Secondary | ICD-10-CM

## 2024-12-08 ENCOUNTER — Other Ambulatory Visit (HOSPITAL_COMMUNITY): Payer: Self-pay

## 2024-12-08 MED ORDER — PANTOPRAZOLE SODIUM 40 MG PO TBEC
40.0000 mg | DELAYED_RELEASE_TABLET | Freq: Every day | ORAL | 0 refills | Status: DC
  Filled 2024-12-08: qty 30, 30d supply, fill #0

## 2025-01-04 ENCOUNTER — Institutional Professional Consult (permissible substitution) (INDEPENDENT_AMBULATORY_CARE_PROVIDER_SITE_OTHER): Admitting: Nurse Practitioner

## 2025-01-04 ENCOUNTER — Other Ambulatory Visit: Payer: Self-pay | Admitting: Family Medicine

## 2025-01-04 DIAGNOSIS — K219 Gastro-esophageal reflux disease without esophagitis: Secondary | ICD-10-CM

## 2025-01-05 ENCOUNTER — Other Ambulatory Visit (HOSPITAL_COMMUNITY): Payer: Self-pay

## 2025-01-05 MED ORDER — PANTOPRAZOLE SODIUM 40 MG PO TBEC
40.0000 mg | DELAYED_RELEASE_TABLET | Freq: Every day | ORAL | 0 refills | Status: AC
  Filled 2025-01-05: qty 90, 90d supply, fill #0

## 2025-02-05 ENCOUNTER — Ambulatory Visit: Admitting: Family Medicine

## 2025-02-12 ENCOUNTER — Ambulatory Visit: Admitting: Family Medicine

## 2025-03-01 ENCOUNTER — Ambulatory Visit: Admitting: Nurse Practitioner

## 2025-03-03 ENCOUNTER — Ambulatory Visit: Admitting: Nurse Practitioner
